# Patient Record
Sex: Female | Born: 1935 | Race: Black or African American | Hispanic: No | Marital: Single | Smoking: Never smoker
Health system: Southern US, Community
[De-identification: ages and names within clinical notes are randomized; demographics above are authoritative.]

## PROBLEM LIST (undated history)

## (undated) DIAGNOSIS — N289 Disorder of kidney and ureter, unspecified: Secondary | ICD-10-CM

## (undated) DIAGNOSIS — I10 Essential (primary) hypertension: Secondary | ICD-10-CM

## (undated) DIAGNOSIS — M1990 Unspecified osteoarthritis, unspecified site: Secondary | ICD-10-CM

## (undated) HISTORY — PX: CHOLECYSTECTOMY: SHX55

## (undated) HISTORY — PX: BREAST SURGERY: SHX581

---

## 2021-02-28 ENCOUNTER — Encounter (HOSPITAL_COMMUNITY): Payer: Self-pay

## 2021-02-28 ENCOUNTER — Ambulatory Visit (HOSPITAL_COMMUNITY)
Admission: EM | Admit: 2021-02-28 | Discharge: 2021-02-28 | Disposition: A | Discharge status: Home / Self Care | Payer: Medicare PPO | Attending: Physician Assistant | Admitting: Physician Assistant

## 2021-02-28 ENCOUNTER — Other Ambulatory Visit: Payer: Self-pay

## 2021-02-28 ENCOUNTER — Ambulatory Visit (INDEPENDENT_AMBULATORY_CARE_PROVIDER_SITE_OTHER): Payer: Medicare PPO

## 2021-02-28 DIAGNOSIS — R8290 Unspecified abnormal findings in urine: Secondary | ICD-10-CM | POA: Insufficient documentation

## 2021-02-28 DIAGNOSIS — U071 COVID-19: Secondary | ICD-10-CM | POA: Diagnosis present

## 2021-02-28 DIAGNOSIS — R059 Cough, unspecified: Secondary | ICD-10-CM

## 2021-02-28 HISTORY — DX: Unspecified osteoarthritis, unspecified site: M19.90

## 2021-02-28 HISTORY — DX: Disorder of kidney and ureter, unspecified: N28.9

## 2021-02-28 HISTORY — DX: Essential (primary) hypertension: I10

## 2021-02-28 LAB — COMPREHENSIVE METABOLIC PANEL
ALT: 27 U/L (ref 0–44)
AST: 38 U/L (ref 15–41)
Albumin: 3.7 g/dL (ref 3.5–5.0)
Alkaline Phosphatase: 53 U/L (ref 38–126)
Anion gap: 6 (ref 5–15)
BUN: 38 mg/dL — ABNORMAL HIGH (ref 8–23)
CO2: 29 mmol/L (ref 22–32)
Calcium: 8.9 mg/dL (ref 8.9–10.3)
Chloride: 105 mmol/L (ref 98–111)
Creatinine, Ser: 1.92 mg/dL — ABNORMAL HIGH (ref 0.44–1.00)
GFR, Estimated: 24 mL/min — ABNORMAL LOW (ref >60)
Glucose, Bld: 95 mg/dL (ref 70–99)
Potassium: 5 mmol/L (ref 3.5–5.1)
Sodium: 140 mmol/L (ref 135–145)
Total Bilirubin: 0.5 mg/dL (ref 0.3–1.2)
Total Protein: 7 g/dL (ref 6.5–8.1)

## 2021-02-28 LAB — POCT URINALYSIS DIPSTICK, ED / UC
Bilirubin Urine: NEGATIVE
Glucose, UA: NEGATIVE mg/dL
Ketones, ur: NEGATIVE mg/dL
Nitrite: POSITIVE — AB
Protein, ur: 100 mg/dL — AB
Specific Gravity, Urine: 1.02 (ref 1.005–1.030)
Urobilinogen, UA: 0.2 mg/dL (ref 0.0–1.0)
pH: 5 (ref 5.0–8.0)

## 2021-02-28 LAB — CBC WITH DIFFERENTIAL/PLATELET
Abs Immature Granulocytes: 0.01 10*3/uL (ref 0.00–0.07)
Basophils Absolute: 0 10*3/uL (ref 0.0–0.1)
Basophils Relative: 0 %
Eosinophils Absolute: 0 10*3/uL (ref 0.0–0.5)
Eosinophils Relative: 0 %
HCT: 39.1 % (ref 36.0–46.0)
Hemoglobin: 12.1 g/dL (ref 12.0–15.0)
Immature Granulocytes: 0 %
Lymphocytes Relative: 56 %
Lymphs Abs: 2.7 10*3/uL (ref 0.7–4.0)
MCH: 26.1 pg (ref 26.0–34.0)
MCHC: 30.9 g/dL (ref 30.0–36.0)
MCV: 84.4 fL (ref 80.0–100.0)
Monocytes Absolute: 0.4 10*3/uL (ref 0.1–1.0)
Monocytes Relative: 9 %
Neutro Abs: 1.7 10*3/uL (ref 1.7–7.7)
Neutrophils Relative %: 35 %
Platelets: 90 10*3/uL — ABNORMAL LOW (ref 150–400)
RBC: 4.63 MIL/uL (ref 3.87–5.11)
RDW: 14.1 % (ref 11.5–15.5)
WBC: 4.9 10*3/uL (ref 4.0–10.5)
nRBC: 0 % (ref 0.0–0.2)

## 2021-02-28 MED ORDER — BENZONATATE 100 MG PO CAPS: 100.0000 mg | ORAL_CAPSULE | Freq: Three times a day (TID) | ORAL | 0 refills | Status: DC

## 2021-02-28 MED ORDER — CEPHALEXIN 500 MG PO CAPS: 500.0000 mg | ORAL_CAPSULE | Freq: Two times a day (BID) | ORAL | 0 refills | Status: DC

## 2021-02-28 NOTE — ED Triage Notes (Signed)
Pt tested positive for covid Tuesday. Per daughter pt with congestion, cough, generalized weakness.

## 2021-02-28 NOTE — Discharge Instructions (Signed)
Your heart rate was low so I recommend that you follow-up with a cardiologist.  If you develop any lightheadedness or dizziness you need to go to the hospital.  We will be in contact with your lab results.  Make sure you are taking big deep breaths at least 10/h to help make sure that you are lungs are inflated properly.  You can use Tessalon for cough.  Your urine did have some evidence of infection so we are starting you on an antibiotic known as Keflex that you will take twice a day.  We will contact you if your urine culture indicates we need to change the antibiotics.  Someone should contact you to schedule an appointment with a primary care provider soon.  If you are not able to see a primary care provider within 1 week I recommend coming back for reevaluation.  If anything worsens please go to the hospital as we discussed.

## 2021-02-28 NOTE — ED Provider Notes (Signed)
[facility]    CSN: [id number] Arrival date & time: 02/28/21  1428      History   Chief Complaint Chief Complaint  Patient presents with   Cough   Nasal Congestion   Weakness    HPI Diane Skinner is a 87 y.o. female.   Patient presents today accompanied by her daughter who provides majority of history as patient is very hard of hearing despite using hearing aids.  Daughter reports that patient lives in a skilled nursing facility and was tested for COVID-19 on either Saturday or Sunday (02/22/2021 or 02/23/2021) with results on Tuesday (02/26/2021) that were positive.  Patient continues to have significant cough as well as generalized weakness but denies any chest pain, shortness of breath, nausea, vomiting, dizziness, syncope.  She has not been taking any medication for symptom onset.  She does have a history of hypertension but denies history of diabetes, immunosuppression, asthma, COPD, smoking.  She is up-to-date on COVID-19 vaccinations including 1 booster.  She is also up-to-date on influenza vaccination.   Past Medical History:  Diagnosis Date   Arthritis    Hypertension    Renal disorder     There are no problems to display for this patient.   Past Surgical History:  Procedure Laterality Date   BREAST SURGERY     CHOLECYSTECTOMY      OB History   No obstetric history on file.      Home Medications    Prior to Admission medications   Medication Sig Start Date End Date Taking? Authorizing Provider  AMLODIPINE BESYLATE PO Take by mouth daily.   Yes [provider]  benzonatate (TESSALON) 100 MG capsule Take 1 capsule (100 mg total) by mouth every 8 (eight) hours. 02/28/21  Yes Malia Corsi K, PA-C  cephALEXin (KEFLEX) 500 MG capsule Take 1 capsule (500 mg total) by mouth 2 (two) times daily. 02/28/21  Yes Pinkney Venard K, PA-C  Cyanocobalamin (VITAMIN B12 PO) Take by mouth at bedtime.   Yes [provider]  losartan (COZAAR) 25 MG tablet  Take by mouth daily.   Yes [provider]  LOVASTATIN PO Take by mouth at bedtime.   Yes [provider]  Multiple Vitamins-Minerals (THERA-M PO) Take by mouth daily.   Yes [provider]  OMEPRAZOLE PO Take by mouth daily.   Yes [provider]  TRAZODONE HCL PO Take by mouth at bedtime.   Yes [provider]    Family History Family History  Problem Relation Age of Onset   Hypertension Mother    Heart disease Mother    Breast cancer Sister     Social History Social History   Tobacco Use   Smoking status: Never  Substance Use Topics   Alcohol use: Not Currently   Drug use: Not Currently     Allergies   Patient has no known allergies.   Review of Systems Review of Systems  Constitutional:  Positive for activity change, appetite change and fatigue. Negative for fever.  HENT:  Negative for congestion, sinus pressure, sneezing and sore throat.   Respiratory:  Positive for cough. Negative for shortness of breath.   Cardiovascular:  Negative for chest pain.  Gastrointestinal:  Negative for abdominal pain, diarrhea, nausea and vomiting.  Musculoskeletal:  Negative for arthralgias and myalgias.  Neurological:  Negative for dizziness, light-headedness and headaches.    Physical Exam Triage Vital Signs ED Triage Vitals  Enc Vitals Group     BP  02/28/21 1554 135/68     Pulse Rate 02/28/21 1538 (!) 53     Resp 02/28/21 1538 (!) 22     Temp 02/28/21 1538 99.4 F (37.4 C)     Temp src --      SpO2 02/28/21 1538 100 %     Weight --      Height --      Head Circumference --      Peak Flow --      Pain Score 02/28/21 1540 0     Pain Loc --      Pain Edu? --      Excl. in GC? --    No data found.  Updated Vital Signs BP 135/68   Pulse (!) 48   Temp 99.4 F (37.4 C)   Resp 20   SpO2 99%   Visual Acuity Right Eye Distance:   Left Eye Distance:   Bilateral Distance:    Right Eye Near:   Left Eye Near:     Bilateral Near:     Physical Exam Vitals reviewed.  Constitutional:      General: She is awake. She is not in acute distress.    Appearance: Normal appearance. She is normal weight. She is not ill-appearing.     Comments: Very pleasant female appears stated age no acute distress  HENT:     Head: Normocephalic and atraumatic.     Right Ear: External ear normal.     Left Ear: External ear normal.     Nose: Nose normal. No congestion or rhinorrhea.     Right Sinus: No maxillary sinus tenderness or frontal sinus tenderness.     Left Sinus: No maxillary sinus tenderness or frontal sinus tenderness.     Mouth/Throat:     Pharynx: Uvula midline. No oropharyngeal exudate or posterior oropharyngeal erythema.     Comments: Moderate erythema in posterior oropharynx Cardiovascular:     Rate and Rhythm: Regular rhythm. Bradycardia present.     Heart sounds: Normal heart sounds, S1 normal and S2 normal. No murmur heard. Pulmonary:     Effort: Pulmonary effort is normal.     Breath sounds: Normal breath sounds. No wheezing, rhonchi or rales.     Comments: Clear to auscultation bilaterally Musculoskeletal:     Comments: Strength 4/5 bilateral upper and lower extremities  Lymphadenopathy:     Head:     Right side of head: No submental, submandibular or tonsillar adenopathy.     Left side of head: No submental, submandibular or tonsillar adenopathy.     Cervical: No cervical adenopathy.  Psychiatric:        Behavior: Behavior is cooperative.     UC Treatments / Results  Labs (all labs ordered are listed, but only abnormal results are displayed) Labs Reviewed  POCT URINALYSIS DIPSTICK, ED / UC - Abnormal; Notable for the following components:      Result Value   Hgb urine dipstick MODERATE (*)    Protein, ur 100 (*)    Nitrite POSITIVE (*)    Leukocytes,Ua TRACE (*)    All other components within normal limits  URINE CULTURE  CBC WITH DIFFERENTIAL/PLATELET  COMPREHENSIVE METABOLIC  PANEL    EKG   Radiology DG Chest 2 View  Result Date: 02/28/2021 CLINICAL DATA:  COVID-19, cough EXAM: CHEST - 2 VIEW COMPARISON:  None. FINDINGS: Cardiomediastinal silhouette is within normal limits. There is left basilar subsegmental atelectasis. No focal airspace consolidation. No large pleural effusion or visible  pneumothorax. No acute osseous abnormality. IMPRESSION: Left basilar subsegmental atelectasis. No focal airspace consolidation. Electronically Signed   By: Caprice Renshaw   On: 02/28/2021 16:47    Procedures Procedures (including critical care time)  Medications Ordered in UC Medications - No data to display  Initial Impression / Assessment and Plan / UC Course  I have reviewed the triage vital signs and the nursing notes.  Pertinent labs & imaging results that were available during my care of the patient were reviewed by me and considered in my medical decision making (see chart for details).      Vital signs physical exam reassuring today.  Patient was noted to be bradycardic and so EKG was obtained that showed sinus bradycardia with a ventricular rate of 45 bpm without acute changes.  Unfortunately we do not have a previous to compare.  Patient denies any symptoms including lightheadedness or near syncope.  X-ray showed atelectasis without acute infiltrate.  UA did show evidence of infection so patient was started on Keflex.  CBC and CMP obtained today-results pending.  Will adjust Keflex dose based on laboratory findings.  Urine culture obtained and discussed potential need of changing antibiotics based on susceptibilities identified on culture.  She was encouraged to drink plenty of fluid.  Discussed that they should monitor her pulse oxygenation and her pulse and if pulse ox goes under 93% she needs to be reevaluated immediately.  She recently moved to the area so we will try to establish her with cardiology for management of bradycardia as well as new PCP.  Discussed that if  she is unable to see PCP within 1 week he should return to our clinic for reevaluation.  Discussed alarm symptoms that warrant emergent evaluation.  Strict return precautions given to which patient expressed understanding.  40 minutes spent with patient performing history physical exam, coordinating care with daughter, prescribing medication, reviewing studies including chest x-ray, EKG.  Final Clinical Impressions(s) / UC Diagnoses   Final diagnoses:  COVID-19  Cough  Abnormal urine     Discharge Instructions      Your heart rate was low so I recommend that you follow-up with a cardiologist.  If you develop any lightheadedness or dizziness you need to go to the hospital.  We will be in contact with your lab results.  Make sure you are taking big deep breaths at least 10/h to help make sure that you are lungs are inflated properly.  You can use Tessalon for cough.  Your urine did have some evidence of infection so we are starting you on an antibiotic known as Keflex that you will take twice a day.  We will contact you if your urine culture indicates we need to change the antibiotics.  Someone should contact you to schedule an appointment with a primary care provider soon.  If you are not able to see a primary care provider within 1 week I recommend coming back for reevaluation.  If anything worsens please go to the hospital as we discussed.     ED Prescriptions     Medication Sig Dispense Auth. Provider   cephALEXin (KEFLEX) 500 MG capsule Take 1 capsule (500 mg total) by mouth 2 (two) times daily. 20 capsule Bertrum Helmstetter K, PA-C   benzonatate (TESSALON) 100 MG capsule Take 1 capsule (100 mg total) by mouth every 8 (eight) hours. 21 capsule Ty Oshima K, PA-C      PDMP not reviewed this encounter.   Henli Hey, Noberto Retort, PA-C  02/28/21 1727  

## 2021-03-08 ENCOUNTER — Encounter (HOSPITAL_COMMUNITY): Payer: Self-pay

## 2021-03-08 ENCOUNTER — Other Ambulatory Visit: Payer: Self-pay

## 2021-03-08 ENCOUNTER — Ambulatory Visit (HOSPITAL_COMMUNITY)
Admission: RE | Admit: 2021-03-08 | Discharge: 2021-03-08 | Disposition: A | Discharge status: Home / Self Care | Payer: Medicare PPO | Source: Ambulatory Visit | Attending: Emergency Medicine | Admitting: Emergency Medicine

## 2021-03-08 VITALS — BP 138/64 | HR 49 | Temp 98.4°F | Resp 17

## 2021-03-08 DIAGNOSIS — R8290 Unspecified abnormal findings in urine: Secondary | ICD-10-CM | POA: Diagnosis not present

## 2021-03-08 LAB — POCT URINALYSIS DIPSTICK, ED / UC
Bilirubin Urine: NEGATIVE
Glucose, UA: NEGATIVE mg/dL
Hgb urine dipstick: NEGATIVE
Ketones, ur: NEGATIVE mg/dL
Leukocytes,Ua: NEGATIVE
Nitrite: NEGATIVE
Protein, ur: 30 mg/dL — AB
Specific Gravity, Urine: 1.015 (ref 1.005–1.030)
Urobilinogen, UA: 0.2 mg/dL (ref 0.0–1.0)
pH: 5 (ref 5.0–8.0)

## 2021-03-08 NOTE — ED Triage Notes (Signed)
Pt in for follow up appt   States she had negative covid test last week and UTI but sx have resolved

## 2021-03-08 NOTE — ED Provider Notes (Signed)
[facility]  ____________________________________________  Time seen: Approximately 1:32 PM  I have reviewed the triage vital signs and the nursing notes.   HISTORY  Chief Complaint No chief complaint on file.   Historian Patient     HPI JEZABEL LECKER is a 87 y.o. female presents to the urgent care for a recheck.  Patient had both COVID-19 and UTI over the past 10 days.  Patient is accompanied by her niece who reports that her symptoms have seemingly resolved.  No dysuria, fever, vomiting or diarrhea.  Patient's niece reports the cough seems to be resolving.   Past Medical History:  Diagnosis Date   Arthritis    Hypertension    Renal disorder      Immunizations up to date:  Yes.     Past Medical History:  Diagnosis Date   Arthritis    Hypertension    Renal disorder     There are no problems to display for this patient.   Past Surgical History:  Procedure Laterality Date   BREAST SURGERY     CHOLECYSTECTOMY      Prior to Admission medications   Medication Sig Start Date End Date Taking? Authorizing Provider  AMLODIPINE BESYLATE PO Take by mouth daily.    [provider]  benzonatate (TESSALON) 100 MG capsule Take 1 capsule (100 mg total) by mouth every 8 (eight) hours. 02/28/21   Raspet, Noberto Retort, PA-C  cephALEXin (KEFLEX) 500 MG capsule Take 1 capsule (500 mg total) by mouth 2 (two) times daily. 02/28/21   Raspet, Erin K, PA-C  Cyanocobalamin (VITAMIN B12 PO) Take by mouth at bedtime.    [provider]  losartan (COZAAR) 25 MG tablet Take by mouth daily.    [provider]  LOVASTATIN PO Take by mouth at bedtime.    [provider]  Multiple Vitamins-Minerals (THERA-M PO) Take by mouth daily.    [provider]  OMEPRAZOLE PO Take by mouth daily.    [provider]  TRAZODONE HCL PO Take by mouth at bedtime.    [provider]    Allergies Patient has no known allergies.  Family  History  Problem Relation Age of Onset   Hypertension Mother    Heart disease Mother    Breast cancer Sister     Social History Social History   Tobacco Use   Smoking status: Never   Smokeless tobacco: Never  Substance Use Topics   Alcohol use: Not Currently   Drug use: Not Currently     Review of Systems  Constitutional: No fever/chills Eyes:  No discharge ENT: No upper respiratory complaints. Respiratory: no cough. No SOB/ use of accessory muscles to breath Gastrointestinal:   No nausea, no vomiting.  No diarrhea.  No constipation. Musculoskeletal: Negative for musculoskeletal pain. Skin: Negative for rash, abrasions, lacerations, ecchymosis.    ____________________________________________   PHYSICAL EXAM:  VITAL SIGNS: ED Triage Vitals  Enc Vitals Group     BP 03/08/21 1240 138/64     Pulse Rate 03/08/21 1240 (!) 49     Resp 03/08/21 1240 17     Temp 03/08/21 1240 98.4 F (36.9 C)     Temp Source 03/08/21 1240 Oral     SpO2 03/08/21 1240 100 %     Weight --      Height --      Head Circumference --      Peak Flow --      Pain Score 03/08/21 1238  0     Pain Loc --      Pain Edu? --      Excl. in GC? --      Constitutional: Alert and oriented. Well appearing and in no acute distress. Eyes: Conjunctivae are normal. PERRL. EOMI. Head: Atraumatic. ENT:      Nose: No congestion/rhinnorhea.      Mouth/Throat: Mucous membranes are moist.  Neck: No stridor.  No cervical spine tenderness to palpation. Cardiovascular: Normal rate, regular rhythm. Normal S1 and S2.  Good peripheral circulation. Respiratory: Normal respiratory effort without tachypnea or retractions. Lungs CTAB. Good air entry to the bases with no decreased or absent breath sounds Gastrointestinal: Bowel sounds x 4 quadrants. Soft and nontender to palpation. No guarding or rigidity. No distention. Musculoskeletal: Full range of motion to all extremities. No obvious deformities  noted Neurologic:  Normal for age. No gross focal neurologic deficits are appreciated.  Skin:  Skin is warm, dry and intact. No rash noted. Psychiatric: Mood and affect are normal for age. Speech and behavior are normal.   ____________________________________________   LABS (all labs ordered are listed, but only abnormal results are displayed)  Labs Reviewed  POCT URINALYSIS DIPSTICK, ED / UC - Abnormal; Notable for the following components:      Result Value   Protein, ur 30 (*)    All other components within normal limits   ____________________________________________  EKG   ____________________________________________  RADIOLOGY   No results found.  ____________________________________________    PROCEDURES  Procedure(s) performed:     Procedures     Medications - No data to display   ____________________________________________   INITIAL IMPRESSION / ASSESSMENT AND PLAN / ED COURSE  Pertinent labs & imaging results that were available during my care of the patient were reviewed by me and considered in my medical decision making (see chart for details).      Assessment and plan Abnormal urine Reevaluation 87 year old female presents to the urgent care for reevaluation after recovering from COVID-19 and a UTI.  Urinalysis shows no signs of UTI today and patient denies dysuria, hematuria, increased urinary frequency, low back pain, nausea and vomiting.  Patient reports that cough and viral URI-like symptoms seem to be improving.  Reassurance was given.  All patient questions were answered.  Patient was advised to follow-up with PCP as needed.    ____________________________________________  FINAL CLINICAL IMPRESSION(S) / ED DIAGNOSES  Final diagnoses:  Abnormal urine      NEW MEDICATIONS STARTED DURING THIS VISIT:  ED Discharge Orders     None           This chart was dictated using voice recognition software/Dragon. Despite best  efforts to proofread, errors can occur which can change the meaning. Any change was purely unintentional.     Orvil Feil, PA-C 03/08/21 1359

## 2021-03-13 ENCOUNTER — Encounter: Payer: Medicare PPO | Admitting: Internal Medicine

## 2021-03-14 ENCOUNTER — Ambulatory Visit (INDEPENDENT_AMBULATORY_CARE_PROVIDER_SITE_OTHER): Payer: Medicare PPO | Admitting: Internal Medicine

## 2021-03-14 ENCOUNTER — Other Ambulatory Visit: Payer: Self-pay

## 2021-03-14 VITALS — BP 171/78 | HR 57 | Temp 98.4°F | Wt 119.6 lb

## 2021-03-14 DIAGNOSIS — I10 Essential (primary) hypertension: Secondary | ICD-10-CM

## 2021-03-14 DIAGNOSIS — D696 Thrombocytopenia, unspecified: Secondary | ICD-10-CM | POA: Diagnosis not present

## 2021-03-14 DIAGNOSIS — N3000 Acute cystitis without hematuria: Secondary | ICD-10-CM

## 2021-03-14 DIAGNOSIS — D75839 Thrombocytosis, unspecified: Secondary | ICD-10-CM

## 2021-03-14 DIAGNOSIS — N289 Disorder of kidney and ureter, unspecified: Secondary | ICD-10-CM | POA: Diagnosis not present

## 2021-03-14 NOTE — Progress Notes (Signed)
   CC: Establish Care  HPI:  DianeDiane Skinner is a 87 y.o. person, with a PMH noted below, who presents to the clinic to establish care. To see the management of their acute and chronic conditions, please see the A&P note under the Encounters tab.   Patient is a 87 y/o female accompanied by her sister for today's visit. Both are unsure of patient's overall medical Hx and medication doses. They do not have medications with them today. Patient has moved to [place] to live at [facility]. She was previously seen at [facility] per Diane Skinner's sister. Will need to request for records today to ascertain her medical history. Will reach out to the clinic pharmacist for medication reconciliation.    Past Medical History:  Diagnosis Date   Arthritis    Hypertension    Renal disorder    PMH:  HTN   Medications:  Losartan  Omeprazole  Amlodipine  Lovastatin  Vitamin B12 Trazodone   Surgical H:  Gall bladder  Hospitalizations:  None  Vaccinations:  Unsure  FH:  DM: Mother, Brother HTN: Multiple family members Cancer: 2 brothers with cancer (unsure type)  Stroke: Father, Younger brother MI: Younger brother    SH:  Housing: Occupational psychologist  Job: Retired, worked as a Investment banker, operational at the hospital  Tobacco: When she was young, unsure when quit ETOH: None Drug: None   Review of Systems:   Review of Systems  Constitutional:  Negative for chills, fever, malaise/fatigue and weight loss.  Gastrointestinal:  Negative for abdominal pain, constipation, nausea and vomiting.  Genitourinary:  Negative for dysuria, flank pain, frequency, hematuria and urgency.  Neurological:  Negative for dizziness, tingling and headaches.    Physical Exam:  Vitals:   03/14/21 1409  BP: (!) 171/78  Pulse: (!) 57  Temp: 98.4 F (36.9 C)  TempSrc: Oral  SpO2: 100%  Weight: 119 lb 9.6 oz (54.3 kg)   Physical Exam Constitutional:      Appearance: Normal appearance.  HENT:     Head:  Normocephalic and atraumatic.  Cardiovascular:     Rate and Rhythm: Normal rate and regular rhythm.     Pulses: Normal pulses.     Heart sounds: Normal heart sounds. No murmur heard.   No friction rub. No gallop.  Pulmonary:     Effort: Pulmonary effort is normal. No respiratory distress.     Breath sounds: Normal breath sounds. No stridor. No wheezing, rhonchi or rales.  Abdominal:     General: Abdomen is flat. Bowel sounds are normal.     Tenderness: There is no abdominal tenderness. There is no guarding.  Musculoskeletal:        General: No swelling.     Right lower leg: No edema.     Left lower leg: No edema.  Skin:    General: Skin is warm.  Neurological:     Mental Status: She is alert.  Psychiatric:        Behavior: Behavior normal.     Assessment & Plan:   See Encounters Tab for problem based charting.  Patient discussed with Dr. Antony Contras

## 2021-03-14 NOTE — Patient Instructions (Signed)
Diane Skinner,  It was a pleasure to meet you today!  Today we had a discussion for establishing care. Today we talked about her blood pressure, kidney function, urinary tract infection, and medications.  For your blood pressure, please continue to blood pressure medications. We will check your blood pressure at your next visit, check some blood work is continuing today.  We will reach out to the pharmacist to see if we can assist with your medication dosing.  It was noted on your ED visit that your kidneys appear damaged.  We will repeat blood work today to assess her kidney function.  Please continue to drink fluids and take your blood pressure medications and this will help with your kidney function as well.  Your medications, we will reach out for your charts from [facility].  We will additionally reach out to our pharmacist to see if they can help you with your medication reconciliation.  Please continue using your medications as prescribed. Dolan Amen, MD

## 2021-03-15 ENCOUNTER — Emergency Department (HOSPITAL_COMMUNITY)
Admission: EM | Admit: 2021-03-15 | Discharge: 2021-03-15 | Disposition: A | Discharge status: Home / Self Care | Payer: Medicare PPO | Attending: Emergency Medicine | Admitting: Emergency Medicine

## 2021-03-15 ENCOUNTER — Emergency Department (HOSPITAL_COMMUNITY): Payer: Medicare PPO

## 2021-03-15 ENCOUNTER — Other Ambulatory Visit: Payer: Self-pay

## 2021-03-15 DIAGNOSIS — R630 Anorexia: Secondary | ICD-10-CM | POA: Insufficient documentation

## 2021-03-15 DIAGNOSIS — D696 Thrombocytopenia, unspecified: Secondary | ICD-10-CM | POA: Insufficient documentation

## 2021-03-15 DIAGNOSIS — I10 Essential (primary) hypertension: Secondary | ICD-10-CM | POA: Insufficient documentation

## 2021-03-15 DIAGNOSIS — R001 Bradycardia, unspecified: Secondary | ICD-10-CM | POA: Insufficient documentation

## 2021-03-15 DIAGNOSIS — N189 Chronic kidney disease, unspecified: Secondary | ICD-10-CM | POA: Diagnosis not present

## 2021-03-15 DIAGNOSIS — Z79899 Other long term (current) drug therapy: Secondary | ICD-10-CM | POA: Diagnosis not present

## 2021-03-15 DIAGNOSIS — F0390 Unspecified dementia without behavioral disturbance: Secondary | ICD-10-CM | POA: Insufficient documentation

## 2021-03-15 DIAGNOSIS — Z8616 Personal history of COVID-19: Secondary | ICD-10-CM | POA: Insufficient documentation

## 2021-03-15 DIAGNOSIS — R410 Disorientation, unspecified: Secondary | ICD-10-CM | POA: Diagnosis present

## 2021-03-15 DIAGNOSIS — N289 Disorder of kidney and ureter, unspecified: Secondary | ICD-10-CM | POA: Insufficient documentation

## 2021-03-15 DIAGNOSIS — I129 Hypertensive chronic kidney disease with stage 1 through stage 4 chronic kidney disease, or unspecified chronic kidney disease: Secondary | ICD-10-CM | POA: Insufficient documentation

## 2021-03-15 DIAGNOSIS — N390 Urinary tract infection, site not specified: Secondary | ICD-10-CM | POA: Insufficient documentation

## 2021-03-15 LAB — CBC WITH DIFFERENTIAL/PLATELET
Basophils Absolute: 0 10*3/uL (ref 0.0–0.2)
Basos: 0 %
EOS (ABSOLUTE): 0 10*3/uL (ref 0.0–0.4)
Eos: 0 %
Hematocrit: 39.8 % (ref 34.0–46.6)
Hemoglobin: 12.7 g/dL (ref 11.1–15.9)
Immature Grans (Abs): 0 10*3/uL (ref 0.0–0.1)
Immature Granulocytes: 0 %
Lymphocytes Absolute: 2.1 10*3/uL (ref 0.7–3.1)
Lymphs: 31 %
MCH: 26 pg — ABNORMAL LOW (ref 26.6–33.0)
MCHC: 31.9 g/dL (ref 31.5–35.7)
MCV: 82 fL (ref 79–97)
Monocytes Absolute: 0.5 10*3/uL (ref 0.1–0.9)
Monocytes: 7 %
Neutrophils Absolute: 4 10*3/uL (ref 1.4–7.0)
Neutrophils: 62 %
Platelets: 149 10*3/uL — ABNORMAL LOW (ref 150–450)
RBC: 4.88 x10E6/uL (ref 3.77–5.28)
RDW: 14.3 % (ref 11.7–15.4)
WBC: 6.6 10*3/uL (ref 3.4–10.8)

## 2021-03-15 LAB — COMPREHENSIVE METABOLIC PANEL
ALT: 17 U/L (ref 0–44)
AST: 28 U/L (ref 15–41)
Albumin: 3.5 g/dL (ref 3.5–5.0)
Alkaline Phosphatase: 51 U/L (ref 38–126)
Anion gap: 6 (ref 5–15)
BUN: 18 mg/dL (ref 8–23)
CO2: 27 mmol/L (ref 22–32)
Calcium: 9 mg/dL (ref 8.9–10.3)
Chloride: 104 mmol/L (ref 98–111)
Creatinine, Ser: 1.63 mg/dL — ABNORMAL HIGH (ref 0.44–1.00)
GFR, Estimated: 29 mL/min — ABNORMAL LOW (ref >60)
Glucose, Bld: 92 mg/dL (ref 70–99)
Potassium: 4.4 mmol/L (ref 3.5–5.1)
Sodium: 137 mmol/L (ref 135–145)
Total Bilirubin: 1.6 mg/dL — ABNORMAL HIGH (ref 0.3–1.2)
Total Protein: 6.8 g/dL (ref 6.5–8.1)

## 2021-03-15 LAB — BMP8+ANION GAP
Anion Gap: 16 mmol/L (ref 10.0–18.0)
BUN/Creatinine Ratio: 11 — ABNORMAL LOW (ref 12–28)
BUN: 18 mg/dL (ref 10–36)
CO2: 24 mmol/L (ref 20–29)
Calcium: 9.8 mg/dL (ref 8.7–10.3)
Chloride: 102 mmol/L (ref 96–106)
Creatinine, Ser: 1.57 mg/dL — ABNORMAL HIGH (ref 0.57–1.00)
Glucose: 87 mg/dL (ref 65–99)
Potassium: 5 mmol/L (ref 3.5–5.2)
Sodium: 142 mmol/L (ref 134–144)
eGFR: 30 mL/min/{1.73_m2} — ABNORMAL LOW (ref >59)

## 2021-03-15 LAB — URINALYSIS, ROUTINE W REFLEX MICROSCOPIC
Bacteria, UA: NONE SEEN
Bilirubin Urine: NEGATIVE
Glucose, UA: NEGATIVE mg/dL
Hgb urine dipstick: NEGATIVE
Ketones, ur: NEGATIVE mg/dL
Leukocytes,Ua: NEGATIVE
Nitrite: NEGATIVE
Protein, ur: 100 mg/dL — AB
Specific Gravity, Urine: 1.005 (ref 1.005–1.030)
pH: 7 (ref 5.0–8.0)

## 2021-03-15 LAB — CBC
HCT: 36.9 % (ref 36.0–46.0)
Hemoglobin: 11.6 g/dL — ABNORMAL LOW (ref 12.0–15.0)
MCH: 26 pg (ref 26.0–34.0)
MCHC: 31.4 g/dL (ref 30.0–36.0)
MCV: 82.7 fL (ref 80.0–100.0)
Platelets: 132 10*3/uL — ABNORMAL LOW (ref 150–400)
RBC: 4.46 MIL/uL (ref 3.87–5.11)
RDW: 13.8 % (ref 11.5–15.5)
WBC: 6.2 10*3/uL (ref 4.0–10.5)
nRBC: 0 % (ref 0.0–0.2)

## 2021-03-15 NOTE — Assessment & Plan Note (Addendum)
Patient presenting to establish care found to have thrombocytopenia with a platelet count of 90, will repeat today. Will need medical records from [facility].  - Repeat CBC

## 2021-03-15 NOTE — ED Triage Notes (Signed)
Pt back today brought back in by family with c/o pt being confused and not wanting to eat

## 2021-03-15 NOTE — Assessment & Plan Note (Signed)
BMP Latest Ref Rng & Units 03/14/2021 02/28/2021  Glucose 65 - 99 mg/dL 87 95  BUN 10 - 36 mg/dL 18 12(O)  Creatinine 1.18 - 1.00 mg/dL 8.67(R) 3.73(G)  BUN/Creat Ratio 12 - 28 11(L) -  Sodium 134 - 144 mmol/L 142 140  Potassium 3.5 - 5.2 mmol/L 5.0 5.0  Chloride 96 - 106 mmol/L 102 105  CO2 20 - 29 mmol/L 24 29  Calcium 8.7 - 10.3 mg/dL 9.8 8.9   Patient presents to the clinic today with findings concerning for kidney injury at her ED visit. She is accompanied by her sister today who states that [facility] has been looking at her kidney function for the past year. She is taking losartan for renal protection. Patient has not noted decrease in urination or hematuria.  Will collect records from her previous provider at the [facility].  - Continue Losartan 25 mg

## 2021-03-15 NOTE — Assessment & Plan Note (Signed)
Patient presenting to establish care found to have t

## 2021-03-15 NOTE — Assessment & Plan Note (Signed)
Vitals:   03/14/21 1409  BP: (!) 171/78  Pulse: (!) 57  Temp: 98.4 F (36.9 C)  SpO2: 100%   Patient is accompanied by her sister today for establishment of care today. Diane Skinner takes amlodipine and losartan. What limited records we have shows that she takes losartan 25 mg daily, but they are unsure of the amlodipine.    Her blood pressure is uncontrolled at this time. She has not taken her medications today, and she does not have her medications with her today. Will requests records today and have her reevaluated in 4 weeks for BP check. Will additionally reach out to pharmacy for medication reconciliation.  - Requests records from [facility] - RTC in 4 weeks for BP check

## 2021-03-15 NOTE — ED Notes (Signed)
Patient transported to CT 

## 2021-03-15 NOTE — Assessment & Plan Note (Signed)
Completed course of Keflex. Resolved

## 2021-03-15 NOTE — ED Provider Notes (Signed)
[facility] EMERGENCY DEPARTMENT Provider Note   CSN: [id number] Arrival date & time: 03/15/21  1103     History No chief complaint on file.   Diane Skinner is a 87 y.o. female.  The history is provided by the patient, a relative and medical records.  Diane Skinner is a 87 y.o. female who presents to the Emergency Department complaining of confusion and poor appetite. Level V caveat due to confusion. History is provided by the patient and her niece. Patient was diagnosed with COVID-19 about two weeks ago. She did have a mild cough at the time of diagnosis, which has since resolved. She was initially seen at urgent care was also diagnosed with UTI and she is completed a course of antibiotics for this as well. The patient recently moved to the area about three weeks ago from [place]. [facility] living facility. She has some memory issues at baseline. Niece reports that the patient didn't eat as much yesterday. She has been drinking ensuring Gatorade but does not complete an entire bottle. This also occurred today. Nice also reports that yesterday when she the patient was in the vehicle and it was hot out the patient got hot and inappropriately attempted to take off her clothes in the vehicle. She seemed at that time like she wasn't thinking as clearly as she usually does.   No fever.  Had a cough - now resolved.  No HA, CP, AP, N/V/D.   Patient denies any acute complaints.    Past Medical History:  Diagnosis Date   Arthritis    Hypertension    Renal disorder     Patient Active Problem List   Diagnosis Date Noted   Thrombocytopenia (HCC) 03/15/2021   Renal disorder    Hypertension     Past Surgical History:  Procedure Laterality Date   BREAST SURGERY     CHOLECYSTECTOMY       OB History   No obstetric history on file.     Family History  Problem Relation Age of Onset   Hypertension Mother     Heart disease Mother    Breast cancer Sister     Social History   Tobacco Use   Smoking status: Never   Smokeless tobacco: Never  Substance Use Topics   Alcohol use: Not Currently   Drug use: Not Currently    Home Medications Prior to Admission medications   Medication Sig Start Date End Date Taking? Authorizing Provider  AMLODIPINE BESYLATE PO Take by mouth daily.    [provider]  benzonatate (TESSALON) 100 MG capsule Take 1 capsule (100 mg total) by mouth every 8 (eight) hours. 02/28/21   Raspet, Noberto Retort, PA-C  cephALEXin (KEFLEX) 500 MG capsule Take 1 capsule (500 mg total) by mouth 2 (two) times daily. 02/28/21   Raspet, Erin K, PA-C  Cyanocobalamin (VITAMIN B12 PO) Take by mouth at bedtime.    [provider]  losartan (COZAAR) 25 MG tablet Take by mouth daily.    [provider]  LOVASTATIN PO Take by mouth at bedtime.    [provider]  Multiple Vitamins-Minerals (THERA-M PO) Take by mouth daily.    [provider]  OMEPRAZOLE PO Take by mouth daily.    [provider]  TRAZODONE HCL PO Take by mouth at bedtime.    [provider]    Allergies    Patient has no known allergies.  Review of Systems   Review of Systems  All other systems reviewed and are negative.  Physical Exam Updated Vital Signs BP (!) 151/79   Pulse (!) 47   Temp 98.5 F (36.9 C) (Oral)   Resp 13   SpO2 99%   Physical Exam Vitals and nursing note reviewed.  Constitutional:      Appearance: She is well-developed.  HENT:     Head: Normocephalic and atraumatic.  Cardiovascular:     Rate and Rhythm: Regular rhythm. Bradycardia present.     Heart sounds: No murmur heard. Pulmonary:     Effort: Pulmonary effort is normal. No respiratory distress.     Breath sounds: Normal breath sounds.  Abdominal:     Palpations: Abdomen is soft.     Tenderness: There is no abdominal tenderness. There is no guarding or rebound.   Musculoskeletal:        General: No tenderness.  Skin:    General: Skin is warm and dry.  Neurological:     Mental Status: She is alert.     Comments: No asymmetry of facial movements. Five out of five strength in all four extremities with sensation to light touch intact in all four extremities. Oriented to person and place. Disoriented to time.  Psychiatric:        Behavior: Behavior normal.    ED Results / Procedures / Treatments   Labs (all labs ordered are listed, but only abnormal results are displayed) Labs Reviewed  CBC - Abnormal; Notable for the following components:      Result Value   Hemoglobin 11.6 (*)    Platelets 132 (*)    All other components within normal limits  COMPREHENSIVE METABOLIC PANEL - Abnormal; Notable for the following components:   Creatinine, Ser 1.63 (*)    Total Bilirubin 1.6 (*)    GFR, Estimated 29 (*)    All other components within normal limits  URINALYSIS, ROUTINE W REFLEX MICROSCOPIC - Abnormal; Notable for the following components:   Protein, ur 100 (*)    All other components within normal limits  URINE CULTURE    EKG EKG Interpretation  Date/Time:  Friday March 15 2021 13:56:34 EDT Ventricular Rate:  48 PR Interval:  151 QRS Duration: 86 QT Interval:  500 QTC Calculation: 447 R Axis:   7 Text Interpretation: Sinus bradycardia Confirmed by Tilden Fossa [phone]) on 03/15/2021 2:41:11 PM  Radiology CT Head Wo Contrast  Result Date: 03/15/2021 CLINICAL DATA:  Mental status change. EXAM: CT HEAD WITHOUT CONTRAST TECHNIQUE: Contiguous axial images were obtained from the base of the skull through the vertex without intravenous contrast. COMPARISON:  None. FINDINGS: Mildly motion limited evaluation. Brain: No evidence of acute large vascular territory infarction, hemorrhage, hydrocephalus, extra-axial collection or mass lesion/mass effect. Moderate patchy white matter hypoattenuation, nonspecific but most likely related to chronic  microvascular ischemic disease. Mild for age atrophy. Vascular: Calcific atherosclerosis. No hyperdense vessel identified. Skull: No acute fracture. Sinuses/Orbits: Mild paranasal sinus mucosal thickening. No acute orbital findings. Other: No mastoid effusions. IMPRESSION: 1. No evidence of acute intracranial abnormality. 2. Moderate chronic microvascular ischemic disease and mild atrophy. Electronically Signed   By: Feliberto Harts MD   On: 03/15/2021 13:52    Procedures Procedures   Medications Ordered in ED Medications - No data to display  ED Course  I have reviewed the triage vital signs and the nursing notes.  Pertinent labs & imaging results that were available during my care of the patient  were reviewed by me and considered in my medical decision making (see chart for details).    MDM Rules/Calculators/A&P                         Patient with history of CKD, dementia here for evaluation of poor oral intake since yesterday with a confusional episode yesterday. Records reviewed in care everywhere. She is bradycardic and this appears to be at her baseline. She is disoriented to date but otherwise oriented. No focal neurologic deficits on examination. Labs are with stable renal insufficiency. UA is not consistent with UTI today. Current presentation is not consistent with CVA, sepsis, delirium. Discussed oral fluid hydration in the setting of recent COVID infection and outpatient follow-up and return precautions.  Final Clinical Impression(s) / ED Diagnoses Final diagnoses:  Confusion  Poor appetite    Rx / DC Orders ED Discharge Orders     None        Tilden Fossa, MD 03/15/21 1451

## 2021-03-16 LAB — URINE CULTURE: Culture: <10000 — AB

## 2021-03-19 NOTE — Progress Notes (Signed)
Internal Medicine Clinic Attending ? ?Case discussed with Dr. Winters  At the time of the visit.  We reviewed the resident?s history and exam and pertinent patient test results.  I agree with the assessment, diagnosis, and plan of care documented in the resident?s note.  ?

## 2021-04-11 ENCOUNTER — Telehealth: Payer: Self-pay

## 2021-04-11 ENCOUNTER — Other Ambulatory Visit: Payer: Self-pay | Admitting: Student

## 2021-04-11 DIAGNOSIS — I10 Essential (primary) hypertension: Secondary | ICD-10-CM

## 2021-04-11 DIAGNOSIS — N289 Disorder of kidney and ureter, unspecified: Secondary | ICD-10-CM

## 2021-04-11 MED ORDER — LOSARTAN POTASSIUM 25 MG PO TABS: 25.0000 mg | ORAL_TABLET | Freq: Every day | ORAL | 11 refills | Status: DC

## 2021-04-11 MED ORDER — AMLODIPINE BESYLATE 10 MG PO TABS: 10.0000 mg | ORAL_TABLET | Freq: Every day | ORAL | 11 refills | Status: DC

## 2021-04-11 NOTE — Telephone Encounter (Signed)
Patient had office visit w/ Dr. Sande Brothers on 03/14/21 , 1st visit & establish care visit.  Per office visit notes records request sent to [facility], nothing noted in chart.  TC [facility]'s [facility], spoke with pharmacist who states they have no fills on file for any meds.  TC to patients niece, Amalia Greenhouse [facility]), she states patient is completely out of Amlodipine and Lovastatin and needs refill.  Per LOV, pt is to have b/p follow up 4 weeks from LOV, appt scheduled for 04/17/21 @ 0945 w/ red team, Dr. Marijo Conception.  EC asking for refill on b/p med, but she doesn't know the dosage.  EC gave RN phone number from old pharmacy, [facility], [place], [place].    TC to previous pharmacy in [place], spoke with pharmacist, Danne Harbor who states patient picked up Amlodipine 10mg , take one daily #90 on 12/14/20.  She has no recent RX's for the Lovastatin.  (As of note, pharmacist also states patient picked up Losartan 50mg , take 2 daily on 03/20/21.  This is different than what is written on med list).  Pt would benefit from appt w/ [facility] clinical pharmacist if MD approves.  Will forward to PCP and red team.  Please advise on amlodipine refill request. Thank you, SChaplin, RN,BSN

## 2021-04-11 NOTE — Telephone Encounter (Signed)
AMLODIPINE BESYLATE PO  LOVASTATIN PO, ref[facility] @ [id number] [place], [place] - [address]  Pt's niece requesting all meds to be fill by today, states pt completely out.

## 2021-04-11 NOTE — Telephone Encounter (Signed)
TC to Ms Thurmond Butts [facility]) VM obtained and a message was left that 2 RX's for b/p were [facility] (Amlodipine and Losartan).  Instructions left that these were the only 2 b/p meds patient were to take for b/p medciation and not to take the older dose of Losartan. Also informed to be here at 0900 on 04/17/21 to meet with Pharmacist before MD appt at 0945.  RN asked if [facility] would call triage back and let triage know she understood instructions. SChaplin, RN,BSN

## 2021-04-16 NOTE — Telephone Encounter (Signed)
ERROR

## 2021-04-17 ENCOUNTER — Ambulatory Visit (INDEPENDENT_AMBULATORY_CARE_PROVIDER_SITE_OTHER): Payer: Medicare PPO | Admitting: Student

## 2021-04-17 ENCOUNTER — Other Ambulatory Visit: Payer: Self-pay

## 2021-04-17 ENCOUNTER — Ambulatory Visit (INDEPENDENT_AMBULATORY_CARE_PROVIDER_SITE_OTHER): Payer: Medicare PPO | Admitting: Pharmacist

## 2021-04-17 ENCOUNTER — Encounter: Payer: Self-pay | Admitting: Student

## 2021-04-17 DIAGNOSIS — I10 Essential (primary) hypertension: Secondary | ICD-10-CM | POA: Diagnosis not present

## 2021-04-17 DIAGNOSIS — R001 Bradycardia, unspecified: Secondary | ICD-10-CM | POA: Diagnosis not present

## 2021-04-17 DIAGNOSIS — Z79899 Other long term (current) drug therapy: Secondary | ICD-10-CM

## 2021-04-17 DIAGNOSIS — S31000A Unspecified open wound of lower back and pelvis without penetration into retroperitoneum, initial encounter: Secondary | ICD-10-CM | POA: Diagnosis not present

## 2021-04-17 DIAGNOSIS — Z0000 Encounter for general adult medical examination without abnormal findings: Secondary | ICD-10-CM | POA: Diagnosis not present

## 2021-04-17 NOTE — Patient Instructions (Signed)
[name], it was a pleasure seeing you today!  Today we discussed: - Blood pressure: We will continue with your blood pressure medications as prescribed.  - Make sure you are drinking lots of water while  you are outside.  - For the wound, I would recommend an over the counter barrier ointment. You can get these at a local pharmacy.  Follow-up: 3 months   Please make sure to arrive 15 minutes prior to your next appointment. If you arrive late, you may be asked to reschedule.   We look forward to seeing you next time. Please call our clinic at [phone] if you have any questions or concerns. The best time to call is Monday-Friday from 9am-4pm, but there is someone available 24/7. If after hours or the weekend, call the main hospital number and ask for the Internal Medicine Resident On-Call. If you need medication refills, please notify your pharmacy one week in advance and they will send [place] a request.  Thank you for letting us take part in your care. Wishing you the best!  Thank you, Evlyn Kanner, MD

## 2021-04-17 NOTE — Assessment & Plan Note (Addendum)
Diane Skinner is accompanied by her niece today. She recently moved from the [facility] region and is currently living at an independent living facility. Her family has hired Chemical engineer to assist with her ADL's and administering medications. [name] denies any recent falls and states she feels safe at her facility. She has been sitting outside for multiple hours in the evenings with her friends. Of note, she has had a recent episode of dehydration. I had a discussion with [name] regarding the importance of aggressive hydration while she is sitting outside, especially during the summer months. Records have been obtained by our clinic, will review for need for any immunizations. In addition, records have previous Mini-Cog and MOCAs. Will need to review these prior to next visit for further assessment. Today [name] does not have her hearing aides, making this difficult for an accurate assessment.

## 2021-04-17 NOTE — Progress Notes (Signed)
   Subjective:    Patient ID: Diane Skinner, female    DOB: 08-Oct-1922, 87 y.o.   MRN: [id number]  HPI  Patient is a 87 y.o. female who presents for medication review and management. She is in good spirits and presents with assistance of cane with her niece, Amalia Greenhouse, to appointment. Patient was referred and last seen by provider, Dr. Sande Brothers, on 03/14/21.  Patient did not present with medications in hand but niece read off her medication list to me on her phone.   Niece reports patient lives at [facility] independently but she has hired someone to come in and help bath patient and administer her medications. Niece reports having to hire someone to give patient medications after she found her mixing up her medications post COVID-19 infection. Niece previously filled pill organizers and patient would self-administer medications but reports that since patient had COVID-19 in early June she has had some confusion and "cloudiness of her brain."  Objective:   Labs:   Physical Exam Neurological:     Mental Status: She is alert.    Review of Systems  Cardiovascular:  Negative for leg swelling.   Assessment/Plan:   At present patient does not have good understanding of her medications but her niece does. No concerns of patient missing doses of medications due to aid administering for her currently. Discussed with niece to schedule appointment with me if she needs assistance with medications in the future. Patient was provided with a printed medication list.   Follow-up with provider at next scheduled appointment. Written patient instructions provided.  This appointment required 24 minutes of direct patient care.  Thank you for involving pharmacy to assist in providing this patient's care.

## 2021-04-17 NOTE — Assessment & Plan Note (Signed)
Diane Skinner has asymptomatic chronic bradycardia. She continues to deny any symptoms today. She is not on any medications with negative chronotropic effect. Will continue to monitor for now, no further work-up needed at this time.

## 2021-04-17 NOTE — Assessment & Plan Note (Signed)
BP Readings from Last 3 Encounters:  04/17/21 (!) 148/71  03/15/21 (!) 146/97  03/14/21 (!) 171/78   Blood pressure remains stable from previous visit. She denies any chest pain, palpitations, lightheadedness. She is currently spending a few hours outside daily with a recent episode of dehydration. I am hesitant to add another blood pressure medication or increase her dosages for risk of dizziness and ultimately a fall. Will continue with her current blood pressure medication, can discuss further titration as needed at next appointment.  - Losartan 25mg  daily - Amlodipine 10mg  daily - BMP at next visit

## 2021-04-17 NOTE — Progress Notes (Signed)
   CC: blood pressure follow-up  HPI:  Ms.Earline S Camarena is a 87 y.o. with medical history as below presenting to [facility] for blood pressure follow-up.  Please see problem-based list for further details, assessments, and plans.  Past Medical History:  Diagnosis Date   Arthritis    Hypertension    Renal disorder    Review of Systems:  As per HPI  Physical Exam:  Vitals:   04/17/21 0945  BP: (!) 148/71  Pulse: (!) 53  Temp: 98.3 F (36.8 C)  TempSrc: Oral  Height: 5\' 6"  (1.676 m)   General: Resting comfortably in chair in no acute distress HENT: Normocephalic, atraumatic. CV: Bradycardic, regular rhythm. No murmurs, rubs, gallops. Distal pulses 2+ bilaterally. Pulm: Normal work of breathing on room air. Clear to auscultation bilaterally. MSK: Decreased bulk throughout. No pitting edema bilaterally. Skin: Non-blanching, closed sacral wound superior and left of intergluteal cleft. Mild fluctuance upon palpation, no induration or drainage. Neuro: Awake, alert, intermittently conversing appropriately. Psych: Normal mood, affect, speech.  Assessment & Plan:   See Encounters Tab for problem based charting.  Patient discussed with Dr.  

## 2021-04-17 NOTE — Patient Instructions (Signed)
Diane Skinner it was a pleasure seeing you today.   Today we reviewed all of the medications you are currently taking. Included is an updated medication list. Please continue taking all medications as prescribed on this list.  If you have any questions please call the clinic and ask to speak with me.  Follow-up with provider at next schedule appointment.

## 2021-04-17 NOTE — Assessment & Plan Note (Signed)
[name] reports a sacral wound that has been present "for awhile." She has just recently established with our clinic, unsure of previous history. Per Diane Skinner niece accompanying her today, previously was opened but has never been infected. Diane Skinner continues to report some pain in the area, although this has improved. She has been using rubbing alcohol on the area daily. Of note, she has aides that help administer her medications daily at her independent living facility.  On exam, there is a non-blanching, closed wound superior and to the left of intergluteal cleft with mild fluctuance. I discussed with Diane Skinner and her niece that she should not sit on the area for prolonged periods of time. I have also instructed them to get barrier cream to help decrease friction and decrease risk of opening the wound.  - Over the counter barrier cream daily - Avoid prolonged periods of pressure on the area - Will need to check the wound with each subsequent visit

## 2021-04-21 NOTE — Progress Notes (Signed)
Internal Medicine Clinic Attending ? ?Case discussed with Dr. Braswell  At the time of the visit.  We reviewed the resident?s history and exam and pertinent patient test results.  I agree with the assessment, diagnosis, and plan of care documented in the resident?s note.  ?

## 2021-04-24 ENCOUNTER — Ambulatory Visit (HOSPITAL_BASED_OUTPATIENT_CLINIC_OR_DEPARTMENT_OTHER): Payer: Medicare PPO | Admitting: Family Medicine

## 2021-06-03 ENCOUNTER — Telehealth: Payer: Self-pay

## 2021-06-03 NOTE — Telephone Encounter (Signed)
Please assign pcp and route back to refill pool

## 2021-06-03 NOTE — Telephone Encounter (Signed)
losartan (COZAAR) 25 MG tablet,  amLODipine (NORVASC) 10 MG tablet,  TRAZODONE HCL PO, 50MG    OMEPRAZOLE 20MG ,  GABAPENTIN 300MG ,.   POTASSIUM 50MG , [facility]LL [facility] @ [id number] [place], [place] - [address]

## 2021-06-19 NOTE — Telephone Encounter (Signed)
Last OV 04/17/2021  Next Appt With Internal Medicine Ellison Carwin, MD)06/25/2021 at  2:30 PM

## 2021-06-25 ENCOUNTER — Encounter: Payer: Medicare PPO | Admitting: Internal Medicine

## 2021-07-05 ENCOUNTER — Other Ambulatory Visit: Payer: Self-pay

## 2021-07-05 DIAGNOSIS — I10 Essential (primary) hypertension: Secondary | ICD-10-CM

## 2021-07-05 DIAGNOSIS — N289 Disorder of kidney and ureter, unspecified: Secondary | ICD-10-CM

## 2021-07-10 ENCOUNTER — Encounter: Payer: Self-pay | Admitting: *Deleted

## 2021-07-10 ENCOUNTER — Encounter: Payer: Self-pay | Admitting: Student

## 2021-07-10 NOTE — Progress Notes (Unsigned)

## 2021-07-10 NOTE — Progress Notes (Unsigned)
Things That May Be Affecting Your Health:  Alcohol  Hearing loss  Pain   X Depression  Home Safety  Sexual Health   Diabetes  Lack of physical activity  Stress  X Difficulty with daily activities X Loneliness  Tiredness   Drug use  Medicines  Tobacco use  X Falls  Motor Vehicle Safety  Weight   Food choices  Oral Health  Other    YOUR PERSONALIZED HEALTH PLAN : 1. Schedule your next subsequent Medicare Wellness visit in one year 2. Attend all of your regular appointments to address your medical issues 3. Complete the preventative screenings and services   Annual Wellness Visit   Medicare Covered Preventative Screenings and Services  Services & Screenings Men and Women Who How Often Need? Date of Last Service Action  Abdominal Aortic Aneurysm Adults with AAA risk factors Once      Alcohol Misuse and Counseling All Adults Screening once a year if no alcohol misuse. Counseling up to 4 face to face sessions.     Bone Density Measurement  Adults at risk for osteoporosis Once every 2 yrs X N/A    Lipid Panel Z13.6 All adults without CV disease Once every 5 yrs       Colorectal Cancer  Stool sample or Colonoscopy All adults 50 and older  Once every year Every 10 years        Depression All Adults Once a year X Today   Diabetes Screening Blood glucose, post glucose load, or GTT Z13.1 All adults at risk Pre-diabetics Once per year Twice per year      Diabetes  Self-Management Training All adults Diabetics 10 hrs first year; 2 hours subsequent years. Requires Copay     Glaucoma Diabetics Family history of glaucoma African Americans 50 yrs + Hispanic Americans 65 yrs + Annually - requires coppay      Hepatitis C Z72.89 or F19.20 High Risk for HCV Born between 1945 and 1965 Annually Once      HIV Z11.4 All adults based on risk Annually btw ages 56 & 9 regardless of risk Annually > 65 yrs if at increased risk      Lung Cancer Screening Asymptomatic adults aged 14-77 with  30 pack yr history and current smoker OR quit within the last 15 yrs Annually Must have counseling and shared decision making documentation before first screen      Medical Nutrition Therapy Adults with  Diabetes Renal disease Kidney transplant within past 3 yrs 3 hours first year; 2 hours subsequent years     Obesity and Counseling All adults Screening once a year Counseling if BMI 30 or higher  Today   Tobacco Use Counseling Adults who use tobacco  Up to 8 visits in one year     Vaccines Z23 Hepatitis B Influenza  Pneumonia  Adults  Once Once every flu season Two different vaccines separated by one year X 06/2019 She is due for this  Next Annual Wellness Visit People with Medicare Every year  Today     Services & Screenings Women Who How Often Need  Date of Last Service Action  Mammogram  Z12.31 Women over 40 One baseline ages 3-39. Annually ager 40 yrs+      Pap tests All women Annually if high risk. Every 2 yrs for normal risk women      Screening for cervical cancer with  Pap (Z01.419 nl or Z01.411abnl) & HPV Z11.51 Women aged 46 to 55 Once every 5 yrs  Screening pelvic and breast exams All women Annually if high risk. Every 2 yrs for normal risk women     Sexually Transmitted Diseases Chlamydia Gonorrhea Syphilis All at risk adults Annually for non pregnant females at increased risk         Services & Screenings Men Who How Ofter Need  Date of Last Service Action  Prostate Cancer - DRE & PSA Men over 50 Annually.  DRE might require a copay.        Sexually Transmitted Diseases Syphilis All at risk adults Annually for men at increased risk      Health Maintenance List Health Maintenance  Topic Date Due   Pneumonia Vaccine 85+ Years old (1 - PCV) Never done   TETANUS/TDAP  Never done   Zoster Vaccines- Shingrix (1 of 2) Never done   DEXA SCAN  Never done   COVID-19 Vaccine (4 - Booster) 10/18/2020   INFLUENZA VACCINE  04/22/2021   HPV VACCINES  Aged  Out

## 2022-01-02 ENCOUNTER — Other Ambulatory Visit: Payer: Self-pay | Admitting: Student

## 2022-01-02 DIAGNOSIS — N289 Disorder of kidney and ureter, unspecified: Secondary | ICD-10-CM

## 2022-01-02 DIAGNOSIS — I10 Essential (primary) hypertension: Secondary | ICD-10-CM

## 2022-02-22 IMAGING — CT CT HEAD W/O CM
4 series · 15 of 47 positions shown, 17 images · non-contrast
Comparison: None.

CLINICAL DATA: Mental status change.

EXAM:
CT HEAD WITHOUT CONTRAST
TECHNIQUE: Contiguous axial images were obtained from the base of the skull
through the vertex without intravenous contrast.

[Series 3: head bone · axial · 0.43mm/px · z∈[-114,-98]mm · 2 of 78 slices shown]
[im 8/78  bone]
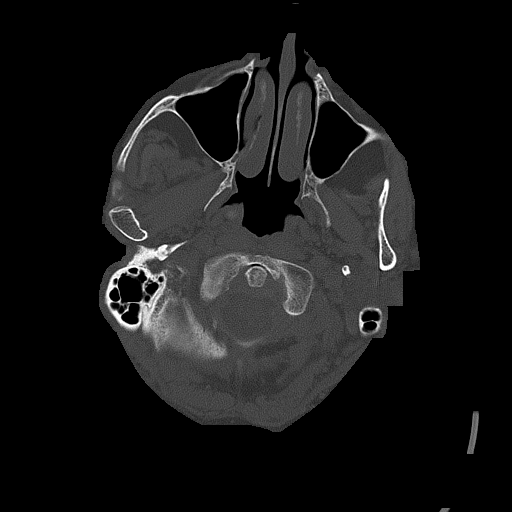
[im 16/78  bone]
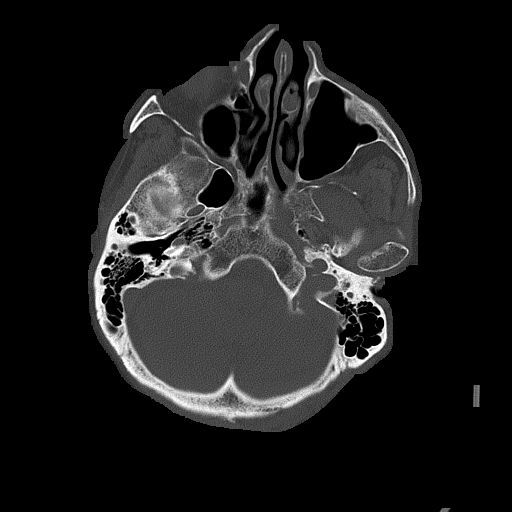

[Series 4: head without · axial · non-contrast · 0.43mm/px · z∈[-112,+2]mm · 7 of 31 slices shown, 9 images]
[im 4/31  brain]
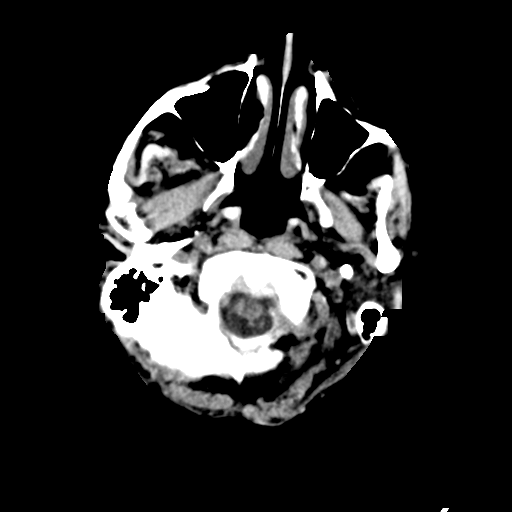
[im 4/31  bone]
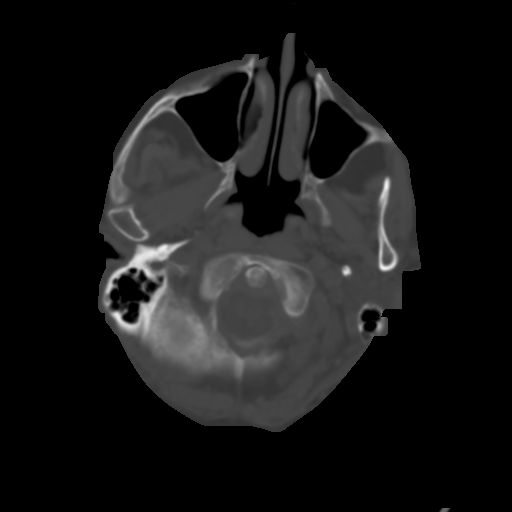
[im 8/31  brain]
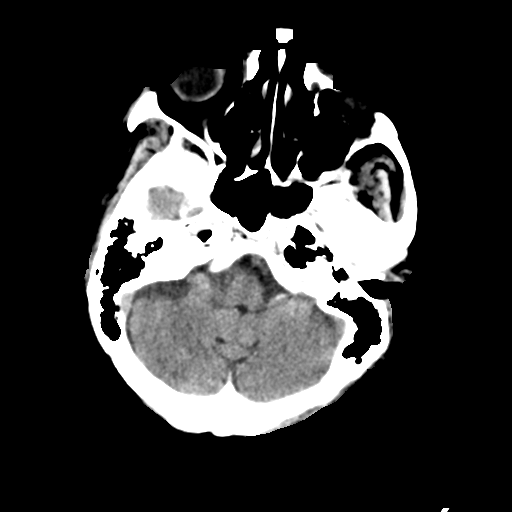
[im 12/31  brain]
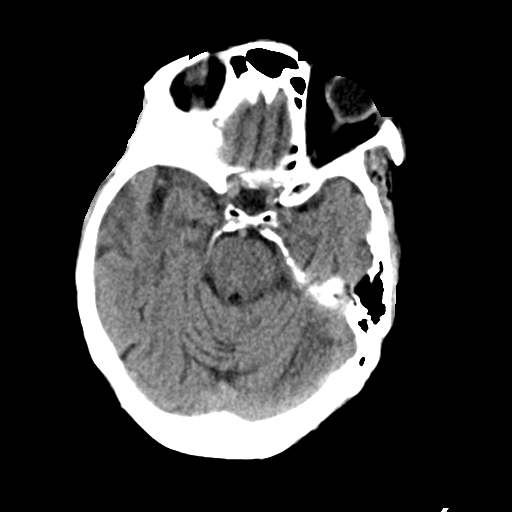
[im 16/31  brain]
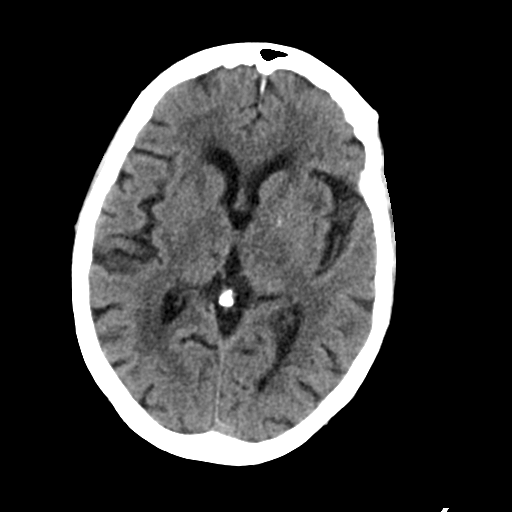
[im 19/31  brain]
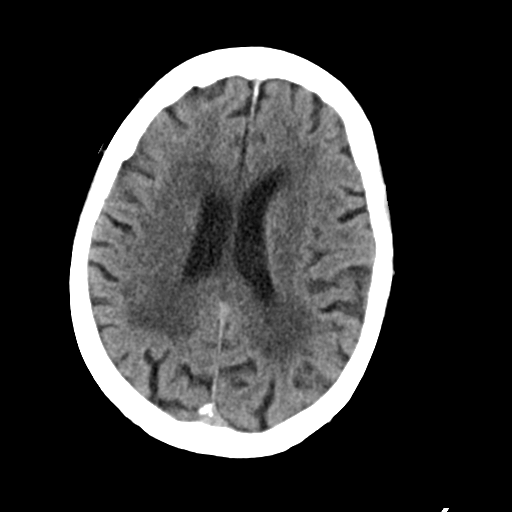
[im 19/31  bone]
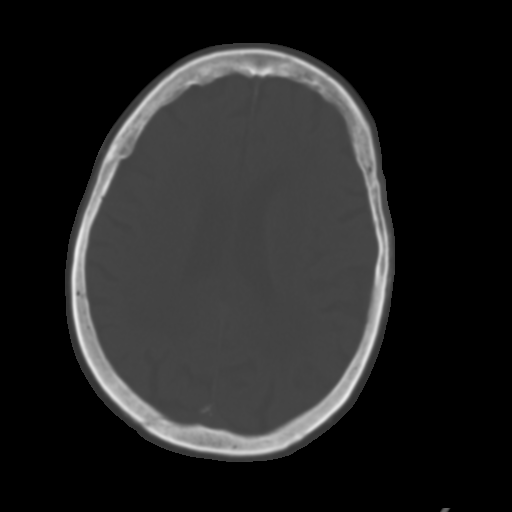
[im 23/31  brain]
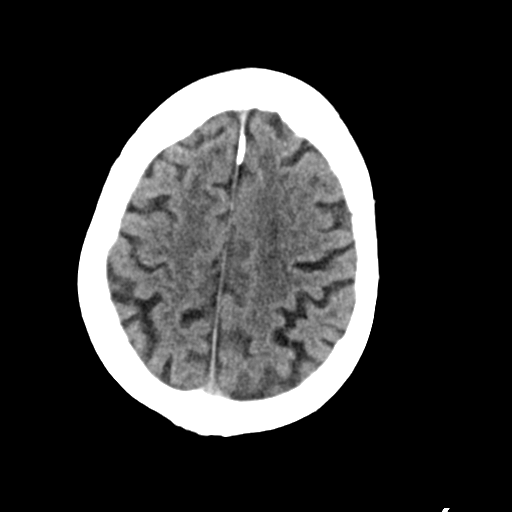
[im 27/31  brain]
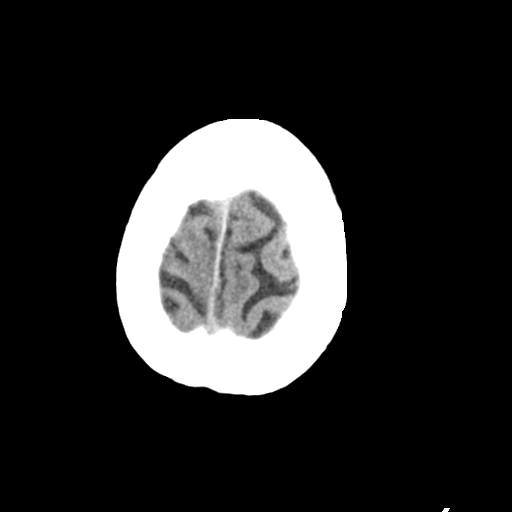

[Series 5: head without cor · coronal · non-contrast · 0.26mm/px · 3 of 67 slices shown]
[im 23/67  brain]
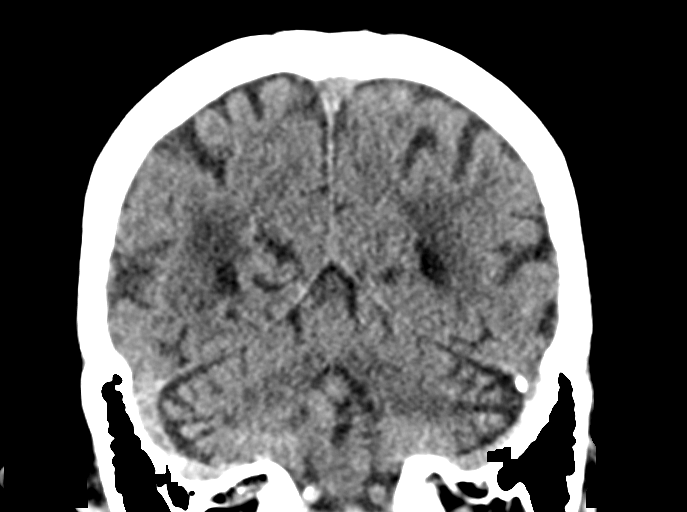
[im 30/67  brain]
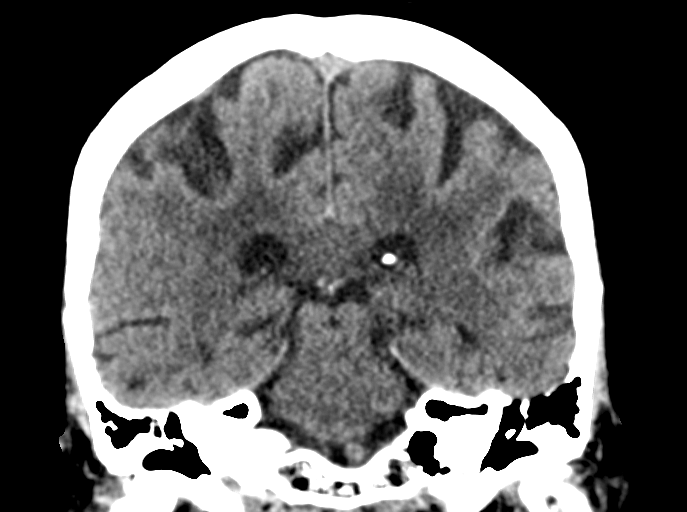
[im 37/67  brain]
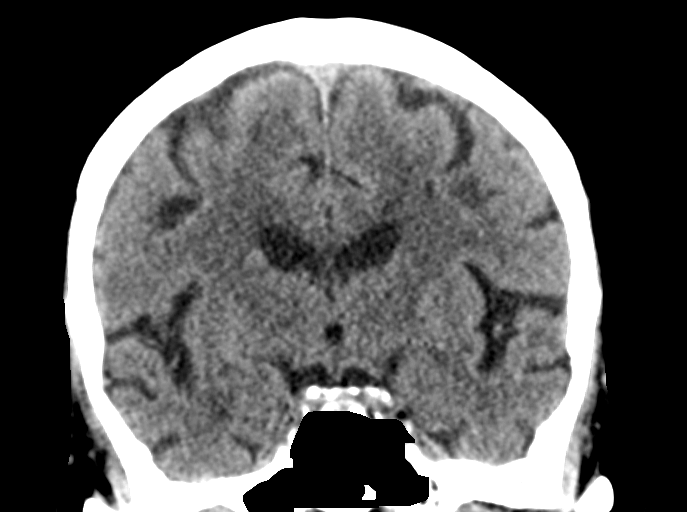

[Series 6: head without sag · sagittal · non-contrast · 0.28mm/px · 3 of 67 slices shown]
[im 23/67  brain]
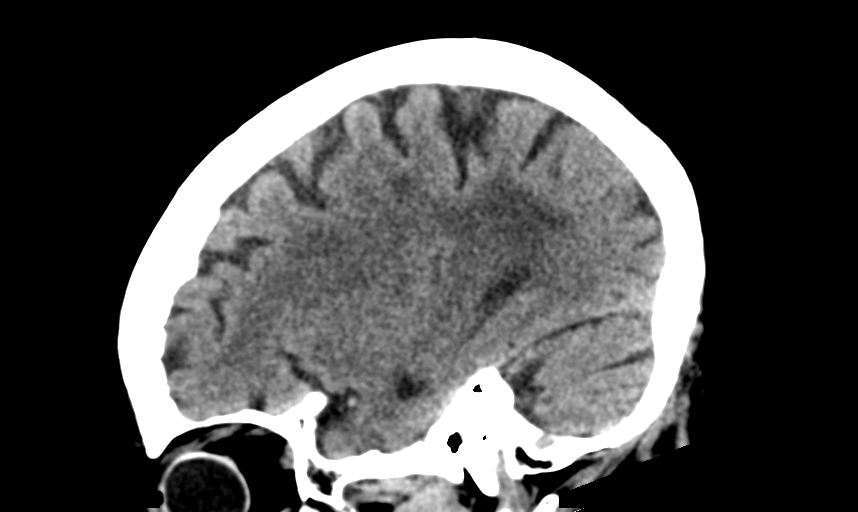
[im 34/67  brain]
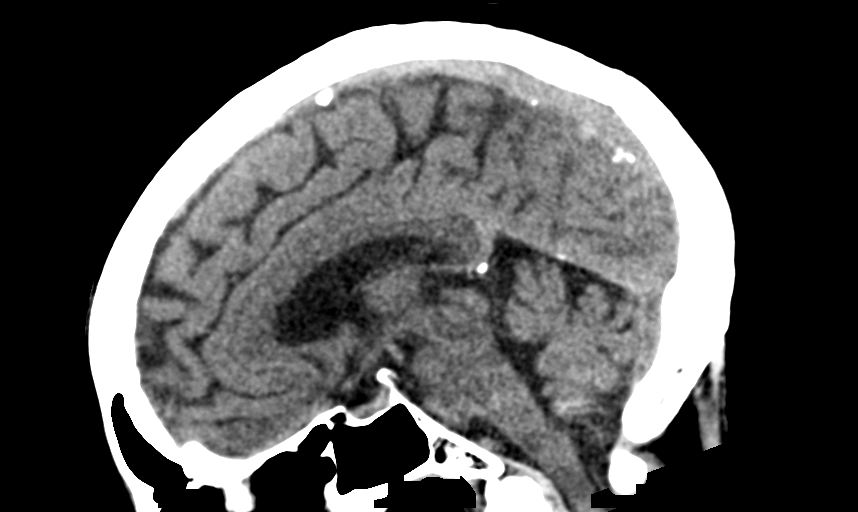
[im 45/67  brain]
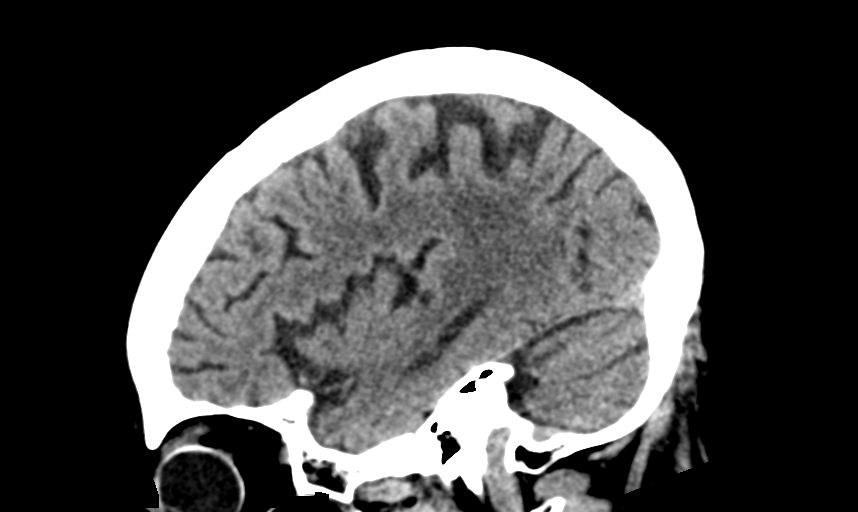

[15 of 47 positions shown; findings below may reference images not displayed]

FINDINGS: Mildly motion limited evaluation.

Brain: No evidence of acute large vascular territory infarction,
hemorrhage, hydrocephalus, extra-axial collection or mass
lesion/mass effect. Moderate patchy white matter hypoattenuation,
nonspecific but most likely related to chronic microvascular
ischemic disease. Mild for age atrophy.

Vascular: Calcific atherosclerosis. No hyperdense vessel identified.

Skull: No acute fracture.

Sinuses/Orbits: Mild paranasal sinus mucosal thickening. No acute
orbital findings.

Other: No mastoid effusions.
IMPRESSION: 1. No evidence of acute intracranial abnormality.
2. Moderate chronic microvascular ischemic disease and mild atrophy.

## 2022-02-25 ENCOUNTER — Other Ambulatory Visit: Payer: Self-pay | Admitting: Student

## 2022-02-25 DIAGNOSIS — I10 Essential (primary) hypertension: Secondary | ICD-10-CM

## 2022-02-25 NOTE — Telephone Encounter (Signed)
Patient needs follow up appointment.  Last seen 04/12/2021.

## 2022-03-16 ENCOUNTER — Encounter: Payer: Self-pay | Admitting: *Deleted

## 2022-04-17 ENCOUNTER — Encounter: Payer: Medicare PPO | Admitting: Student

## 2022-06-22 ENCOUNTER — Emergency Department (HOSPITAL_COMMUNITY)
Admission: EM | Admit: 2022-06-22 | Discharge: 2022-06-22 | Disposition: A | Discharge status: Home / Self Care | Payer: Medicare PPO | Attending: Emergency Medicine | Admitting: Emergency Medicine

## 2022-06-22 ENCOUNTER — Emergency Department (HOSPITAL_COMMUNITY): Payer: Medicare PPO

## 2022-06-22 ENCOUNTER — Other Ambulatory Visit: Payer: Self-pay

## 2022-06-22 ENCOUNTER — Encounter (HOSPITAL_COMMUNITY): Payer: Self-pay

## 2022-06-22 DIAGNOSIS — R410 Disorientation, unspecified: Secondary | ICD-10-CM | POA: Diagnosis not present

## 2022-06-22 DIAGNOSIS — Z79899 Other long term (current) drug therapy: Secondary | ICD-10-CM | POA: Insufficient documentation

## 2022-06-22 DIAGNOSIS — I10 Essential (primary) hypertension: Secondary | ICD-10-CM | POA: Insufficient documentation

## 2022-06-22 DIAGNOSIS — R109 Unspecified abdominal pain: Secondary | ICD-10-CM

## 2022-06-22 LAB — BASIC METABOLIC PANEL
Anion gap: 5 (ref 5–15)
BUN: 31 mg/dL — ABNORMAL HIGH (ref 8–23)
CO2: 24 mmol/L (ref 22–32)
Calcium: 9.1 mg/dL (ref 8.9–10.3)
Chloride: 113 mmol/L — ABNORMAL HIGH (ref 98–111)
Creatinine, Ser: 2.09 mg/dL — ABNORMAL HIGH (ref 0.44–1.00)
GFR, Estimated: 21 mL/min — ABNORMAL LOW (ref >60)
Glucose, Bld: 95 mg/dL (ref 70–99)
Potassium: 4.4 mmol/L (ref 3.5–5.1)
Sodium: 142 mmol/L (ref 135–145)

## 2022-06-22 LAB — CBC
HCT: 39 % (ref 36.0–46.0)
Hemoglobin: 12.2 g/dL (ref 12.0–15.0)
MCH: 26 pg (ref 26.0–34.0)
MCHC: 31.3 g/dL (ref 30.0–36.0)
MCV: 83 fL (ref 80.0–100.0)
Platelets: 130 10*3/uL — ABNORMAL LOW (ref 150–400)
RBC: 4.7 MIL/uL (ref 3.87–5.11)
RDW: 14.2 % (ref 11.5–15.5)
WBC: 6.5 10*3/uL (ref 4.0–10.5)
nRBC: 0 % (ref 0.0–0.2)

## 2022-06-22 LAB — URINALYSIS, ROUTINE W REFLEX MICROSCOPIC
Bacteria, UA: NONE SEEN
Bilirubin Urine: NEGATIVE
Glucose, UA: NEGATIVE mg/dL
Ketones, ur: NEGATIVE mg/dL
Leukocytes,Ua: NEGATIVE
Nitrite: NEGATIVE
Protein, ur: >=300 mg/dL — AB
Specific Gravity, Urine: 1.014 (ref 1.005–1.030)
pH: 5 (ref 5.0–8.0)

## 2022-06-22 LAB — LIPASE, BLOOD: Lipase: 33 U/L (ref 11–51)

## 2022-06-22 NOTE — ED Triage Notes (Signed)
Patient arrived EMS for flank pain on the left side. Patient appeared confused on scene (A&Ox3). Patient is hard of hearing .

## 2022-06-22 NOTE — Discharge Instructions (Signed)
Your work-up was reassuring today.  Follow-up with your doctor as needed.

## 2022-06-22 NOTE — ED Provider Notes (Signed)
[facility]-EMERGENCY DEPT Provider Note   CSN: [id number] Arrival date & time: 06/22/22  0555     History  Chief Complaint  Patient presents with   Flank Pain   Altered Mental Status    Diane Skinner is a 88 y.o. female.   Flank Pain  Altered Mental Status Seen a lot of patients today patient presents with left flank pain.  Reportedly pain earlier today.  Somewhat difficult to get history.  Hurts on left abdomen.  No nausea or vomiting.  No dysuria.  Reportedly had some confusion but is somewhat difficult to elicit of this is from difficulty hearing and her compensating for it or confusion.    Past Medical History:  Diagnosis Date   Arthritis    Hypertension    Renal disorder     Home Medications Prior to Admission medications   Medication Sig Start Date End Date Taking? Authorizing Provider  amLODipine (NORVASC) 10 MG tablet TAKE 1 TABLET BY MOUTH EVERY DAY 02/25/22   Ellison Carwin, MD  Cyanocobalamin (VITAMIN B12 PO) Take by mouth at bedtime.    [provider]  losartan (COZAAR) 25 MG tablet Take 1 tablet (25 mg total) by mouth daily. 04/11/21 04/11/22  Evlyn Kanner, MD  Multiple Vitamins-Minerals (THERA-M PO) Take by mouth daily. Patient not taking: Reported on 04/17/2021    [provider]  OMEPRAZOLE PO Take by mouth daily.    [provider]  TRAZODONE HCL PO Take by mouth at bedtime.    [provider]      Allergies    Patient has no known allergies.    Review of Systems   Review of Systems  Genitourinary:  Positive for flank pain.    Physical Exam Updated Vital Signs BP (!) 143/85 (BP Location: Right Arm)   Pulse 67   Temp 98.5 F (36.9 C) (Oral)   Resp 17   SpO2 100%  Physical Exam Vitals and nursing note reviewed.  HENT:     Head: Atraumatic.  Cardiovascular:     Rate and Rhythm: Regular rhythm.  Abdominal:     Tenderness: There is abdominal tenderness.     Comments: Tenderness  to left upper abdomen.  No rebound or guarding.  No rash.  Skin:    General: Skin is warm.     Capillary Refill: Capillary refill takes less than 2 seconds.  Neurological:     Mental Status: She is alert.     Comments: Awake and pleasant, but some confusion     ED Results / Procedures / Treatments   Labs (all labs ordered are listed, but only abnormal results are displayed) Labs Reviewed  URINALYSIS, ROUTINE W REFLEX MICROSCOPIC - Abnormal; Notable for the following components:      Result Value   Hgb urine dipstick SMALL (*)    Protein, ur >=300 (*)    All other components within normal limits  BASIC METABOLIC PANEL - Abnormal; Notable for the following components:   Chloride 113 (*)    BUN 31 (*)    Creatinine, Ser 2.09 (*)    GFR, Estimated 21 (*)    All other components within normal limits  CBC - Abnormal; Notable for the following components:   Platelets 130 (*)    All other components within normal limits  LIPASE, BLOOD    EKG None  Radiology CT Renal Stone Study  Result Date: 06/22/2022 CLINICAL DATA:  Left-sided flank pain. Concern for kidney stone. Altered mental  status. EXAM: CT ABDOMEN AND PELVIS WITHOUT CONTRAST TECHNIQUE: Multidetector CT imaging of the abdomen and pelvis was performed following the standard protocol without IV contrast. RADIATION DOSE REDUCTION: This exam was performed according to the departmental dose-optimization program which includes automated exposure control, adjustment of the mA and/or kV according to patient size and/or use of iterative reconstruction technique. COMPARISON:  None Available. FINDINGS: Lower chest: No acute abnormality. Hepatobiliary: No focal liver abnormality is seen. Status post cholecystectomy. No biliary dilatation. Pancreas: Unremarkable. No pancreatic ductal dilatation or surrounding inflammatory changes. Spleen: Normal in size without focal abnormality. Adrenals/Urinary Tract: No adrenal masses. Left renal cortical  thinning. No renal masses or stones. No hydronephrosis. Normal ureters. Normal bladder. Stomach/Bowel: Normal stomach. Small bowel and colon are normal in caliber. No wall thickening. No inflammation. Multiple left colon diverticula. No diverticulitis. Vascular/Lymphatic: Aortic atherosclerotic calcifications. No aneurysm. No enlarged lymph nodes. Reproductive: Status post hysterectomy. No adnexal masses. Other: No abdominal wall hernia or abnormality. No abdominopelvic ascites. Musculoskeletal: No fracture or acute finding.  No bone lesion. IMPRESSION: 1. No acute findings within the abdomen or pelvis. No renal or ureteral stones or obstructive uropathy. 2. Aortic atherosclerosis. 3. Left colon diverticula without evidence of diverticulitis. Electronically Signed   By: Lajean Manes M.D.   On: 06/22/2022 09:09    Procedures Procedures    Medications Ordered in ED Medications - No data to display  ED Course/ Medical Decision Making/ A&P                           Medical Decision Making Amount and/or Complexity of Data Reviewed Labs: ordered. Radiology: ordered.   Patient with left-sided abdominal pain.  Reported confusion.  Somewhat difficult to determine whether this is confusion or just compensating for difficulty hearing.  Will get urinalysis some blood work and CT scan.  With tenderness in left upper quadrant pathology such as pancreatitis and UTI are considered. CT scan done without contrast due to some chronic kidney disease.  CT scan reassuring.  Urine does not show infection.  No trauma.  Appears stable for discharge back to nursing home.  Has had no intracranial trauma.        Final Clinical Impression(s) / ED Diagnoses Final diagnoses:  Flank pain    Rx / DC Orders ED Discharge Orders     None         Davonna Belling, MD 06/22/22 1453

## 2022-06-22 NOTE — ED Notes (Addendum)
Pt resides at [facility]. Pt niece updated. Fleeta Emmer Crear [phone].

## 2022-12-05 ENCOUNTER — Inpatient Hospital Stay (HOSPITAL_COMMUNITY)
Admission: EM | Admit: 2022-12-05 | Discharge: 2022-12-10 | DRG: 177 | Disposition: A | Discharge status: Home Health | Payer: Medicare PPO | Attending: Internal Medicine | Admitting: Internal Medicine

## 2022-12-05 ENCOUNTER — Emergency Department (HOSPITAL_COMMUNITY): Payer: Medicare PPO

## 2022-12-05 ENCOUNTER — Encounter (HOSPITAL_COMMUNITY): Payer: Self-pay | Admitting: Radiology

## 2022-12-05 ENCOUNTER — Other Ambulatory Visit: Payer: Self-pay

## 2022-12-05 ENCOUNTER — Observation Stay (HOSPITAL_COMMUNITY): Payer: Medicare PPO

## 2022-12-05 DIAGNOSIS — R0902 Hypoxemia: Secondary | ICD-10-CM

## 2022-12-05 DIAGNOSIS — J9601 Acute respiratory failure with hypoxia: Secondary | ICD-10-CM | POA: Diagnosis present

## 2022-12-05 DIAGNOSIS — W19XXXA Unspecified fall, initial encounter: Principal | ICD-10-CM | POA: Diagnosis present

## 2022-12-05 DIAGNOSIS — I129 Hypertensive chronic kidney disease with stage 1 through stage 4 chronic kidney disease, or unspecified chronic kidney disease: Secondary | ICD-10-CM | POA: Diagnosis present

## 2022-12-05 DIAGNOSIS — G629 Polyneuropathy, unspecified: Secondary | ICD-10-CM

## 2022-12-05 DIAGNOSIS — R296 Repeated falls: Secondary | ICD-10-CM

## 2022-12-05 DIAGNOSIS — R0602 Shortness of breath: Secondary | ICD-10-CM

## 2022-12-05 DIAGNOSIS — H9110 Presbycusis, unspecified ear: Secondary | ICD-10-CM | POA: Diagnosis present

## 2022-12-05 DIAGNOSIS — E875 Hyperkalemia: Secondary | ICD-10-CM | POA: Diagnosis present

## 2022-12-05 DIAGNOSIS — Y92009 Unspecified place in unspecified non-institutional (private) residence as the place of occurrence of the external cause: Secondary | ICD-10-CM | POA: Diagnosis not present

## 2022-12-05 DIAGNOSIS — F0390 Unspecified dementia without behavioral disturbance: Secondary | ICD-10-CM | POA: Diagnosis present

## 2022-12-05 DIAGNOSIS — R059 Cough, unspecified: Secondary | ICD-10-CM | POA: Diagnosis present

## 2022-12-05 DIAGNOSIS — N179 Acute kidney failure, unspecified: Secondary | ICD-10-CM | POA: Diagnosis present

## 2022-12-05 DIAGNOSIS — I10 Essential (primary) hypertension: Secondary | ICD-10-CM | POA: Diagnosis present

## 2022-12-05 DIAGNOSIS — L89151 Pressure ulcer of sacral region, stage 1: Secondary | ICD-10-CM | POA: Diagnosis present

## 2022-12-05 DIAGNOSIS — J9691 Respiratory failure, unspecified with hypoxia: Secondary | ICD-10-CM | POA: Diagnosis not present

## 2022-12-05 DIAGNOSIS — U071 COVID-19: Principal | ICD-10-CM | POA: Diagnosis present

## 2022-12-05 DIAGNOSIS — Z79899 Other long term (current) drug therapy: Secondary | ICD-10-CM

## 2022-12-05 DIAGNOSIS — N1832 Chronic kidney disease, stage 3b: Secondary | ICD-10-CM | POA: Diagnosis present

## 2022-12-05 DIAGNOSIS — Z8249 Family history of ischemic heart disease and other diseases of the circulatory system: Secondary | ICD-10-CM

## 2022-12-05 DIAGNOSIS — L8990 Pressure ulcer of unspecified site, unspecified stage: Secondary | ICD-10-CM | POA: Insufficient documentation

## 2022-12-05 DIAGNOSIS — R338 Other retention of urine: Secondary | ICD-10-CM | POA: Diagnosis present

## 2022-12-05 DIAGNOSIS — Z6820 Body mass index (BMI) 20.0-20.9, adult: Secondary | ICD-10-CM

## 2022-12-05 DIAGNOSIS — R001 Bradycardia, unspecified: Secondary | ICD-10-CM | POA: Diagnosis not present

## 2022-12-05 DIAGNOSIS — E538 Deficiency of other specified B group vitamins: Secondary | ICD-10-CM | POA: Diagnosis present

## 2022-12-05 DIAGNOSIS — M1990 Unspecified osteoarthritis, unspecified site: Secondary | ICD-10-CM | POA: Diagnosis present

## 2022-12-05 DIAGNOSIS — R051 Acute cough: Secondary | ICD-10-CM

## 2022-12-05 DIAGNOSIS — H9190 Unspecified hearing loss, unspecified ear: Secondary | ICD-10-CM | POA: Diagnosis present

## 2022-12-05 DIAGNOSIS — M25551 Pain in right hip: Secondary | ICD-10-CM | POA: Diagnosis present

## 2022-12-05 DIAGNOSIS — E440 Moderate protein-calorie malnutrition: Secondary | ICD-10-CM | POA: Diagnosis present

## 2022-12-05 DIAGNOSIS — K219 Gastro-esophageal reflux disease without esophagitis: Secondary | ICD-10-CM | POA: Diagnosis present

## 2022-12-05 DIAGNOSIS — D696 Thrombocytopenia, unspecified: Secondary | ICD-10-CM | POA: Diagnosis present

## 2022-12-05 DIAGNOSIS — Z9049 Acquired absence of other specified parts of digestive tract: Secondary | ICD-10-CM

## 2022-12-05 LAB — COMPREHENSIVE METABOLIC PANEL
ALT: 11 U/L (ref 0–44)
AST: 18 U/L (ref 15–41)
Albumin: 3.8 g/dL (ref 3.5–5.0)
Alkaline Phosphatase: 60 U/L (ref 38–126)
Anion gap: 7 (ref 5–15)
BUN: 34 mg/dL — ABNORMAL HIGH (ref 8–23)
CO2: 22 mmol/L (ref 22–32)
Calcium: 8.8 mg/dL — ABNORMAL LOW (ref 8.9–10.3)
Chloride: 109 mmol/L (ref 98–111)
Creatinine, Ser: 1.96 mg/dL — ABNORMAL HIGH (ref 0.44–1.00)
GFR, Estimated: 23 mL/min — ABNORMAL LOW (ref >60)
Glucose, Bld: 92 mg/dL (ref 70–99)
Potassium: 4.7 mmol/L (ref 3.5–5.1)
Sodium: 138 mmol/L (ref 135–145)
Total Bilirubin: 1 mg/dL (ref 0.3–1.2)
Total Protein: 7.8 g/dL (ref 6.5–8.1)

## 2022-12-05 LAB — CBC
HCT: 40.7 % (ref 36.0–46.0)
Hemoglobin: 12.2 g/dL (ref 12.0–15.0)
MCH: 25.3 pg — ABNORMAL LOW (ref 26.0–34.0)
MCHC: 30 g/dL (ref 30.0–36.0)
MCV: 84.3 fL (ref 80.0–100.0)
Platelets: 135 10*3/uL — ABNORMAL LOW (ref 150–400)
RBC: 4.83 MIL/uL (ref 3.87–5.11)
RDW: 14.4 % (ref 11.5–15.5)
WBC: 6.5 10*3/uL (ref 4.0–10.5)
nRBC: 0 % (ref 0.0–0.2)

## 2022-12-05 LAB — LACTIC ACID, PLASMA: Lactic Acid, Venous: 1 mmol/L (ref 0.5–1.9)

## 2022-12-05 LAB — CBC WITH DIFFERENTIAL/PLATELET
Abs Immature Granulocytes: 0.05 10*3/uL (ref 0.00–0.07)
Basophils Absolute: 0 10*3/uL (ref 0.0–0.1)
Basophils Relative: 0 %
Eosinophils Absolute: 0 10*3/uL (ref 0.0–0.5)
Eosinophils Relative: 0 %
HCT: 40.7 % (ref 36.0–46.0)
Hemoglobin: 12.3 g/dL (ref 12.0–15.0)
Immature Granulocytes: 1 %
Lymphocytes Relative: 22 %
Lymphs Abs: 1.9 10*3/uL (ref 0.7–4.0)
MCH: 25.4 pg — ABNORMAL LOW (ref 26.0–34.0)
MCHC: 30.2 g/dL (ref 30.0–36.0)
MCV: 83.9 fL (ref 80.0–100.0)
Monocytes Absolute: 0.8 10*3/uL (ref 0.1–1.0)
Monocytes Relative: 10 %
Neutro Abs: 5.6 10*3/uL (ref 1.7–7.7)
Neutrophils Relative %: 67 %
Platelets: 149 10*3/uL — ABNORMAL LOW (ref 150–400)
RBC: 4.85 MIL/uL (ref 3.87–5.11)
RDW: 14.5 % (ref 11.5–15.5)
WBC: 8.4 10*3/uL (ref 4.0–10.5)
nRBC: 0 % (ref 0.0–0.2)

## 2022-12-05 LAB — CREATININE, SERUM
Creatinine, Ser: 1.86 mg/dL — ABNORMAL HIGH (ref 0.44–1.00)
GFR, Estimated: 24 mL/min — ABNORMAL LOW (ref >60)

## 2022-12-05 LAB — RESP PANEL BY RT-PCR (RSV, FLU A&B, COVID)  RVPGX2
Influenza A by PCR: NEGATIVE
Influenza B by PCR: NEGATIVE
Resp Syncytial Virus by PCR: NEGATIVE
SARS Coronavirus 2 by RT PCR: POSITIVE — AB

## 2022-12-05 LAB — CBG MONITORING, ED: Glucose-Capillary: 84 mg/dL (ref 70–99)

## 2022-12-05 MED ORDER — HEPARIN SODIUM (PORCINE) 5000 UNIT/ML IJ SOLN
5000.0000 [IU] | Freq: Three times a day (TID) | INTRAMUSCULAR | Status: DC
  Administered 2022-12-05 – 2022-12-10 (×14): 5000 [IU] via SUBCUTANEOUS
  Filled 2022-12-05 (×14): qty 1

## 2022-12-05 MED ORDER — ACETAMINOPHEN 650 MG RE SUPP: 650.0000 mg | Freq: Four times a day (QID) | RECTAL | Status: DC | PRN

## 2022-12-05 MED ORDER — DOCUSATE SODIUM 100 MG PO CAPS
100.0000 mg | ORAL_CAPSULE | Freq: Two times a day (BID) | ORAL | Status: DC
  Administered 2022-12-05 – 2022-12-10 (×10): 100 mg via ORAL
  Filled 2022-12-05 (×11): qty 1

## 2022-12-05 MED ORDER — GABAPENTIN 300 MG PO CAPS
600.0000 mg | ORAL_CAPSULE | Freq: Every day | ORAL | Status: DC
  Administered 2022-12-05: 600 mg via ORAL
  Filled 2022-12-05: qty 2

## 2022-12-05 MED ORDER — AMLODIPINE BESYLATE 10 MG PO TABS
10.0000 mg | ORAL_TABLET | Freq: Every day | ORAL | Status: DC
  Administered 2022-12-06 – 2022-12-10 (×5): 10 mg via ORAL
  Filled 2022-12-05 (×5): qty 1

## 2022-12-05 MED ORDER — SODIUM CHLORIDE 0.9 % IV SOLN: INTRAVENOUS | Status: DC

## 2022-12-05 MED ORDER — ONDANSETRON HCL 4 MG/2ML IJ SOLN: 4.0000 mg | Freq: Four times a day (QID) | INTRAMUSCULAR | Status: DC | PRN

## 2022-12-05 MED ORDER — ACETAMINOPHEN 325 MG PO TABS: 650.0000 mg | ORAL_TABLET | Freq: Four times a day (QID) | ORAL | Status: DC | PRN

## 2022-12-05 MED ORDER — ONDANSETRON HCL 4 MG PO TABS: 4.0000 mg | ORAL_TABLET | Freq: Four times a day (QID) | ORAL | Status: DC | PRN

## 2022-12-05 MED ORDER — PHENOL 1.4 % MT LIQD
1.0000 | OROMUCOSAL | Status: DC | PRN
  Administered 2022-12-05: 1 via OROMUCOSAL
  Filled 2022-12-05: qty 177

## 2022-12-05 NOTE — ED Notes (Signed)
ED TO INPATIENT HANDOFF REPORT  ED Nurse Name and Phone #: Clarise Cruz [phone]  S Name/Age/Gender Diane Skinner 89 y.o. female Room/Bed: WA17/WA17  Code Status   Code Status: Full Code  Home/SNF/Other  Patient oriented to: self, place, and situation Is this baseline? Yes   Triage Complete: Triage complete  Chief Complaint COVID-19 [U07.1]  Triage Note Pt BIBA from [facility]. Pt had unwitnessed fall this AM, down for unknown amt of time before being found by staff. Pt states "I just couldn't make it to the toilet", claims no LOC. Not on blood thinners.  No bruising or deformities present during triage assessment.   Aox4  Hx dementia   Allergies No Known Allergies  Level of Care/Admitting Diagnosis ED Disposition     ED Disposition  Admit   Condition  --   Comment  Hospital Area: [place] [[id number]]  Level of Care: Med-Surg [16]  May place patient in observation at [facility] or [place] if equivalent level of care is available:: Yes  Covid Evaluation: Confirmed COVID Positive  Diagnosis: COVID-19 [id number]  Admitting Physician: Lucillie Garfinkel [id number]  Attending Physician: Hollice Gong, Curlene Labrum [id number]          B Medical/Surgery History Past Medical History:  Diagnosis Date   Arthritis    Hypertension    Renal disorder    Past Surgical History:  Procedure Laterality Date   BREAST SURGERY     CHOLECYSTECTOMY       A IV Location/Drains/Wounds Patient Lines/Drains/Airways Status     Active Line/Drains/Airways     Name Placement date Placement time Site Days   Peripheral IV 06/22/22 22 G Anterior;Left Forearm 06/22/22  0741  Forearm  166   Peripheral IV 12/05/22 20 G Anterior;Left;Upper Arm 12/05/22  1216  Arm  less than 1            Intake/Output Last 24 hours No intake or output data in the 24 hours ending 12/05/22 1612  Labs/Imaging Results for orders placed or performed during the hospital  encounter of 12/05/22 (from the past 48 hour(s))  POC CBG, ED     Status: None   Collection Time: 12/05/22 10:17 AM  Result Value Ref Range   Glucose-Capillary 84 70 - 99 mg/dL    Comment: Glucose reference range applies only to samples taken after fasting for at least 8 hours.  Resp panel by RT-PCR (RSV, Flu A&B, Covid) Anterior Nasal Swab     Status: Abnormal   Collection Time: 12/05/22 10:30 AM   Specimen: Anterior Nasal Swab  Result Value Ref Range   SARS Coronavirus 2 by RT PCR POSITIVE (A) NEGATIVE    Comment: (NOTE) SARS-CoV-2 target nucleic acids are DETECTED.  The SARS-CoV-2 RNA is generally detectable in upper respiratory specimens during the acute phase of infection. Positive results are indicative of the presence of the identified virus, but do not rule out bacterial infection or co-infection with other pathogens not detected by the test. Clinical correlation with patient history and other diagnostic information is necessary to determine patient infection status. The expected result is Negative.  Fact Sheet for Patients: EntrepreneurPulse.com.au  Fact Sheet for Healthcare Providers: IncredibleEmployment.be  This test is not yet approved or cleared by the [place] FDA and  has been authorized for detection and/or diagnosis of SARS-CoV-2 by FDA under an Emergency Use Authorization (EUA).  This EUA will remain in effect (meaning this test can be used) for the duration of  the  COVID-19 declaration under Section 564(b)(1) of the A ct, 21 U.S.C. section 360bbb-3(b)(1), unless the authorization is terminated or revoked sooner.     Influenza A by PCR NEGATIVE NEGATIVE   Influenza B by PCR NEGATIVE NEGATIVE    Comment: (NOTE) The Xpert Xpress SARS-CoV-2/FLU/RSV plus assay is intended as an aid in the diagnosis of influenza from Nasopharyngeal swab specimens and should not be used as a sole basis for treatment. Nasal washings  and aspirates are unacceptable for Xpert Xpress SARS-CoV-2/FLU/RSV testing.  Fact Sheet for Patients: EntrepreneurPulse.com.au  Fact Sheet for Healthcare Providers: IncredibleEmployment.be  This test is not yet approved or cleared by the [place] FDA and has been authorized for detection and/or diagnosis of SARS-CoV-2 by FDA under an Emergency Use Authorization (EUA). This EUA will remain in effect (meaning this test can be used) for the duration of the COVID-19 declaration under Section 564(b)(1) of the Act, 21 U.S.C. section 360bbb-3(b)(1), unless the authorization is terminated or revoked.     Resp Syncytial Virus by PCR NEGATIVE NEGATIVE    Comment: (NOTE) Fact Sheet for Patients: EntrepreneurPulse.com.au  Fact Sheet for Healthcare Providers: IncredibleEmployment.be  This test is not yet approved or cleared by the [place] FDA and has been authorized for detection and/or diagnosis of SARS-CoV-2 by FDA under an Emergency Use Authorization (EUA). This EUA will remain in effect (meaning this test can be used) for the duration of the COVID-19 declaration under Section 564(b)(1) of the Act, 21 U.S.C. section 360bbb-3(b)(1), unless the authorization is terminated or revoked.  Performed at [facility], [place] [address], [place], [place] [address]   Lactic acid, plasma     Status: None   Collection Time: 12/05/22 12:15 PM  Result Value Ref Range   Lactic Acid, Venous 1.0 0.5 - 1.9 mmol/L    Comment: Performed at [facility], [place] [address] [place], [place] [address]  CBC with Differential     Status: Abnormal   Collection Time: 12/05/22 12:15 PM  Result Value Ref Range   WBC 8.4 4.0 - 10.5 K/uL   RBC 4.85 3.87 - 5.11 MIL/uL   Hemoglobin 12.3 12.0 - 15.0 g/dL   HCT 40.7 36.0 - 46.0 %   MCV 83.9 80.0 - 100.0 fL   MCH 25.4 (L) 26.0 - 34.0 pg   MCHC 30.2  30.0 - 36.0 g/dL   RDW 14.5 11.5 - 15.5 %   Platelets 149 (L) 150 - 400 K/uL   nRBC 0.0 0.0 - 0.2 %   Neutrophils Relative % 67 %   Neutro Abs 5.6 1.7 - 7.7 K/uL   Lymphocytes Relative 22 %   Lymphs Abs 1.9 0.7 - 4.0 K/uL   Monocytes Relative 10 %   Monocytes Absolute 0.8 0.1 - 1.0 K/uL   Eosinophils Relative 0 %   Eosinophils Absolute 0.0 0.0 - 0.5 K/uL   Basophils Relative 0 %   Basophils Absolute 0.0 0.0 - 0.1 K/uL   Immature Granulocytes 1 %   Abs Immature Granulocytes 0.05 0.00 - 0.07 K/uL    Comment: Performed at [facility], [place] [address] [place], [place] [address]  Comprehensive metabolic panel     Status: Abnormal   Collection Time: 12/05/22 12:15 PM  Result Value Ref Range   Sodium 138 135 - 145 mmol/L   Potassium 4.7 3.5 - 5.1 mmol/L   Chloride 109 98 - 111 mmol/L   CO2 22 22 - 32 mmol/L   Glucose, Bld  92 70 - 99 mg/dL    Comment: Glucose reference range applies only to samples taken after fasting for at least 8 hours.   BUN 34 (H) 8 - 23 mg/dL   Creatinine, Ser 1.96 (H) 0.44 - 1.00 mg/dL   Calcium 8.8 (L) 8.9 - 10.3 mg/dL   Total Protein 7.8 6.5 - 8.1 g/dL   Albumin 3.8 3.5 - 5.0 g/dL   AST 18 15 - 41 U/L   ALT 11 0 - 44 U/L   Alkaline Phosphatase 60 38 - 126 U/L   Total Bilirubin 1.0 0.3 - 1.2 mg/dL   GFR, Estimated 23 (L) >60 mL/min    Comment: (NOTE) Calculated using the CKD-EPI Creatinine Equation (2021)    Anion gap 7 5 - 15    Comment: Performed at [facility], [place] [address] [place], [place] [address]   CT HEAD WO CONTRAST (5MM)  Result Date: 12/05/2022 CLINICAL DATA:  Right history: Head trauma, minor. Additional history provided: Unwitnessed fall. EXAM: CT HEAD WITHOUT CONTRAST TECHNIQUE: Contiguous axial images were obtained from the base of the skull through the vertex without intravenous contrast. RADIATION DOSE REDUCTION: This exam was performed according to the departmental dose-optimization program which  includes automated exposure control, adjustment of the mA and/or kV according to patient size and/or use of iterative reconstruction technique. COMPARISON:  Head CT 03/15/2021. FINDINGS: Brain: Moderate generalized cerebral atrophy. Patchy and ill-defined hypoattenuation within the cerebral white matter, nonspecific but compatible with moderate chronic small vessel ischemic disease. There is no acute intracranial hemorrhage. No demarcated cortical infarct. No extra-axial fluid collection. No evidence of an intracranial mass. No midline shift. Vascular: Hyperdense vessel.  Atherosclerotic calcifications no Skull: No fracture or aggressive osseous lesion. Sinuses/Orbits: No mass or acute finding within the imaged orbits. Mild mucosal thickening within the left frontal sinus. Mild-to-moderate mucosal thickening within bilateral ethmoid air cells. Mild mucosal thickening within the bilateral sphenoid sinuses. Mild mucosal thickening within the bilateral maxillary sinuses at the imaged levels. IMPRESSION: 1.  No evidence of an acute intracranial abnormality. 2. Moderate chronic small vessel ischemic changes within the cerebral white matter. 3. Moderate generalized cerebral atrophy. 4. Paranasal sinus disease at the imaged levels, as described. Electronically Signed   By: Kellie Simmering D.O.   On: 12/05/2022 11:34   DG Chest Portable 1 View  Result Date: 12/05/2022 CLINICAL DATA:  Cough, short of EXAM: PORTABLE CHEST 1 VIEW COMPARISON:  02/28/2021 FINDINGS: Prominent cardiac silhouette. Normal pulmonary vasculature. No evidence of effusion, infiltrate, or pneumothorax. No acute bony abnormality. IMPRESSION: No acute cardiopulmonary process. Electronically Signed   By: Suzy Bouchard M.D.   On: 12/05/2022 10:53    Pending Labs Unresulted Labs (From admission, onward)     Start     Ordered   12/06/22 [id number]  Basic metabolic panel  Tomorrow morning,   R        12/05/22 1559   12/06/22 0500  CBC  Tomorrow morning,    R        12/05/22 1559   12/05/22 1551  CBC  (heparin)  Once,   R       Comments: Baseline for heparin therapy IF NOT ALREADY DRAWN.  Notify MD if PLT < 100 K.    12/05/22 1559   12/05/22 1551  Creatinine, serum  (heparin)  Once,   R       Comments: Baseline for heparin therapy IF NOT ALREADY DRAWN.    12/05/22 1559   12/05/22  1030  Blood culture (routine x 2)  BLOOD CULTURE X 2,   R      12/05/22 1029            Vitals/Pain Today's Vitals   12/05/22 1017 12/05/22 1320 12/05/22 1503 12/05/22 1600  BP:  (!) 147/76  (!) 150/71  Pulse:  (!) 59  (!) 55  Resp:  (!) 22  20  Temp:   98.9 F (37.2 C)   TempSrc:   Oral   SpO2:  96%  99%  Weight: 56.7 kg     Height: 5\' 6"  (1.676 m)     PainSc:        Isolation Precautions No active isolations  Medications Medications  0.9 %  sodium chloride infusion (has no administration in time range)  amLODipine (NORVASC) tablet 10 mg (has no administration in time range)  gabapentin (NEURONTIN) capsule 600 mg (has no administration in time range)  heparin injection 5,000 Units (has no administration in time range)  ondansetron (ZOFRAN) tablet 4 mg (has no administration in time range)    Or  ondansetron (ZOFRAN) injection 4 mg (has no administration in time range)  docusate sodium (COLACE) capsule 100 mg (has no administration in time range)  acetaminophen (TYLENOL) tablet 650 mg (has no administration in time range)    Or  acetaminophen (TYLENOL) suppository 650 mg (has no administration in time range)    Mobility walks with device     Focused Assessments    R Recommendations: See Admitting Provider Note  Report given to:   Additional Notes:

## 2022-12-05 NOTE — ED Triage Notes (Signed)
Pt BIBA from [facility]. Pt had unwitnessed fall this AM, down for unknown amt of time before being found by staff. Pt states "I just couldn't make it to the toilet", claims no LOC. Not on blood thinners.  No bruising or deformities present during triage assessment.   Aox4  Hx dementia

## 2022-12-05 NOTE — H&P (Addendum)
History and Physical  SHERLEAN SALASAR [id number] DOB: Sep 05, 1923 DOA: 12/05/2022   PCP: Drucie Opitz, MD   Patient coming from: Nursing home  Chief Complaint: Found down  HPI: Diane Skinner is a 89 y.o. female with medical history significant for arthritis, hypertension, CKD.  Brought to the emergency department for evaluation after a fall.  She was brought in by ambulance from [facility] this morning, her caregiver found her on the floor.  She said she could not make it to the toilet, I am not sure why but there is a presumption that she did indeed hit her head.  I was able to get more history from the patient's niece who is her power of attorney, who mentions that patient was complaining of some right hip pain when she tried to stand before EMS arrived.  Indicates, workup in the emergency department is relatively unrevealing, with some slight hypertension, lab work is stable at the patient's chronic baseline.  Patient is also having some cold-like symptoms according to her niece.  Patient was screened for COVID in the emergency department and positive.  She was intermittently hypoxic enough to be requiring oxygen.  Due to her advanced age, decision was made to admit the patient for observation given her acute hypoxic respiratory failure.  Review of Systems: Please see HPI for pertinent positives and negatives. A complete 10 system review of systems are otherwise negative.  Past Medical History:  Diagnosis Date   Arthritis    Hypertension    Renal disorder    Past Surgical History:  Procedure Laterality Date   BREAST SURGERY     CHOLECYSTECTOMY      Social History:  reports that she has never smoked. She has never used smokeless tobacco. She reports that she does not currently use alcohol. She reports that she does not currently use drugs.   No Known Allergies  Family History  Problem Relation Age of Onset   Hypertension Mother    Heart disease Mother    Breast  cancer Sister      Prior to Admission medications   Medication Sig Start Date End Date Taking? Authorizing Provider  ofloxacin (FLOXIN) 0.3 % OTIC solution Place 2 drops into both ears 2 (two) times daily. 10/15/22  Yes [provider]  valsartan-hydrochlorothiazide (DIOVAN-HCT) 160-25 MG tablet Take 1 tablet by mouth every morning. 11/24/22  Yes [provider]  amLODipine (NORVASC) 10 MG tablet TAKE 1 TABLET BY MOUTH EVERY DAY 02/25/22   Wayland Denis, MD  Cyanocobalamin (VITAMIN B12 PO) Take by mouth at bedtime.    [provider]  gabapentin (NEURONTIN) 300 MG capsule Take 600 mg by mouth at bedtime.    [provider]  losartan (COZAAR) 25 MG tablet Take 1 tablet (25 mg total) by mouth daily. 04/11/21 04/11/22  Sanjuan Dame, MD  Multiple Vitamins-Minerals (THERA-M PO) Take by mouth daily. Patient not taking: Reported on 04/17/2021    [provider]  OMEPRAZOLE PO Take by mouth daily.    [provider]  TRAZODONE HCL PO Take by mouth at bedtime.    [provider]    Physical Exam: BP (!) 147/76   Pulse (!) 59   Temp 98.9 F (37.2 C) (Oral)   Resp (!) 22   Ht 5\' 6"  (1.676 m)   Wt 56.7 kg   SpO2 96%   BMI 20.18 kg/m   General:  Alert, oriented, calm, in no acute distress, elderly patient on 2 L oxygen,  very hard of hearing and somewhat confused Eyes: EOMI, clear conjuctivae, white sclerea Neck: supple, no masses, trachea mildline  Cardiovascular: RRR, no murmurs or rubs, no peripheral edema  Respiratory: clear to auscultation bilaterally, no wheezes, no crackles  Abdomen: soft, nontender, nondistended, normal bowel tones heard  Skin: dry, no rashes  Musculoskeletal: no joint effusions, normal range of motion  Psychiatric: appropriate affect, normal speech  Neurologic: extraocular muscles intact, clear speech, moving all extremities with intact sensorium          Labs on Admission:  Basic Metabolic  Panel: Recent Labs  Lab 12/05/22 1215  NA 138  K 4.7  CL 109  CO2 22  GLUCOSE 92  BUN 34*  CREATININE 1.96*  CALCIUM 8.8*   Liver Function Tests: Recent Labs  Lab 12/05/22 1215  AST 18  ALT 11  ALKPHOS 60  BILITOT 1.0  PROT 7.8  ALBUMIN 3.8   No results for input(s): "LIPASE", "AMYLASE" in the last 168 hours. No results for input(s): "AMMONIA" in the last 168 hours. CBC: Recent Labs  Lab 12/05/22 1215  WBC 8.4  NEUTROABS 5.6  HGB 12.3  HCT 40.7  MCV 83.9  PLT 149*   Cardiac Enzymes: No results for input(s): "CKTOTAL", "CKMB", "CKMBINDEX", "TROPONINI" in the last 168 hours.  BNP (last 3 results) No results for input(s): "BNP" in the last 8760 hours.  ProBNP (last 3 results) No results for input(s): "PROBNP" in the last 8760 hours.  CBG: Recent Labs  Lab 12/05/22 1017  GLUCAP 84    Radiological Exams on Admission: CT HEAD WO CONTRAST (5MM)  Result Date: 12/05/2022 CLINICAL DATA:  Right history: Head trauma, minor. Additional history provided: Unwitnessed fall. EXAM: CT HEAD WITHOUT CONTRAST TECHNIQUE: Contiguous axial images were obtained from the base of the skull through the vertex without intravenous contrast. RADIATION DOSE REDUCTION: This exam was performed according to the departmental dose-optimization program which includes automated exposure control, adjustment of the mA and/or kV according to patient size and/or use of iterative reconstruction technique. COMPARISON:  Head CT 03/15/2021. FINDINGS: Brain: Moderate generalized cerebral atrophy. Patchy and ill-defined hypoattenuation within the cerebral white matter, nonspecific but compatible with moderate chronic small vessel ischemic disease. There is no acute intracranial hemorrhage. No demarcated cortical infarct. No extra-axial fluid collection. No evidence of an intracranial mass. No midline shift. Vascular: Hyperdense vessel.  Atherosclerotic calcifications no Skull: No fracture or aggressive  osseous lesion. Sinuses/Orbits: No mass or acute finding within the imaged orbits. Mild mucosal thickening within the left frontal sinus. Mild-to-moderate mucosal thickening within bilateral ethmoid air cells. Mild mucosal thickening within the bilateral sphenoid sinuses. Mild mucosal thickening within the bilateral maxillary sinuses at the imaged levels. IMPRESSION: 1.  No evidence of an acute intracranial abnormality. 2. Moderate chronic small vessel ischemic changes within the cerebral white matter. 3. Moderate generalized cerebral atrophy. 4. Paranasal sinus disease at the imaged levels, as described. Electronically Signed   By: Kellie Simmering D.O.   On: 12/05/2022 11:34   DG Chest Portable 1 View  Result Date: 12/05/2022 CLINICAL DATA:  Cough, short of EXAM: PORTABLE CHEST 1 VIEW COMPARISON:  02/28/2021 FINDINGS: Prominent cardiac silhouette. Normal pulmonary vasculature. No evidence of effusion, infiltrate, or pneumothorax. No acute bony abnormality. IMPRESSION: No acute cardiopulmonary process. Electronically Signed   By: Suzy Bouchard M.D.   On: 12/05/2022 10:53    Assessment/Plan Principal Problem:   Fall at home, initial encounter -this was unfortunately unwitnessed.  Patient with no obvious injury.  Probably due to what we now know is COVID-19.  She did apparently complain of some right hip pain soon after the fall. -Observation admission -Supplemental oxygen as needed to keep O2 saturation above 90%; discussed with clinical pharmacist, patient is borderline candidate for remdesivir given her hypoxia.  Since she is clinically stable we will hold off on this for now. -PT/OT eval -Will check stat right hip and pelvis x-rays to rule out acute fracture Active Problems:   Thrombocytopenia ([place])   Hypertension   COVID-19   Hip pain, acute, right   Respiratory failure with hypoxia ([place])   Peripheral neuropathy  DVT prophylaxis: Hep SQ   Code Status: FULL -discussed extensively with her  healthcare power of attorney [place]  Family Communication: Was discussed in detail over the phone with her healthcare provide today Tonya, all questions were answered.  Disposition Plan: Likely back to SNF at discharge.  Consults called: None  Admission status: Observation  Time spent: 35 minutes  Ihor Meinzer Neva Seat MD [facility] Pager [phone]  If 7PM-7AM, please contact night-coverage [email or web address] Password [facility]  12/05/2022, 4:03 PM

## 2022-12-05 NOTE — ED Provider Notes (Signed)
[place] EMERGENCY DEPARTMENT AT [facility] Provider Note   CSN: [id number] Arrival date & time: 12/05/22  [id number]     History  Chief Complaint  Patient presents with   Diane Skinner    CHAMERE Skinner is a 89 y.o. female.  The history is provided by the patient and medical records. No language interpreter was used.  Fall This is a new problem. The current episode started 1 to 2 hours ago. The problem occurs rarely. The problem has been resolved. Associated symptoms include shortness of breath. Pertinent negatives include no chest pain, no abdominal pain and no headaches. Nothing aggravates the symptoms. Nothing relieves the symptoms. She has tried nothing for the symptoms. The treatment provided no relief.  Cough Cough characteristics:  Productive Sputum characteristics:  Nondescript Severity:  Moderate Onset quality:  Gradual Duration:  3 days Timing:  Constant Progression:  Waxing and waning Chronicity:  New Relieved by:  Nothing Worsened by:  Nothing Ineffective treatments:  None tried Associated symptoms: chills and shortness of breath   Associated symptoms: no chest pain, no fever, no headaches, no rash and no wheezing        Home Medications Prior to Admission medications   Medication Sig Start Date End Date Taking? Authorizing Provider  amLODipine (NORVASC) 10 MG tablet TAKE 1 TABLET BY MOUTH EVERY DAY 02/25/22   Wayland Denis, MD  Cyanocobalamin (VITAMIN B12 PO) Take by mouth at bedtime.    [provider]  losartan (COZAAR) 25 MG tablet Take 1 tablet (25 mg total) by mouth daily. 04/11/21 04/11/22  Sanjuan Dame, MD  Multiple Vitamins-Minerals (THERA-M PO) Take by mouth daily. Patient not taking: Reported on 04/17/2021    [provider]  OMEPRAZOLE PO Take by mouth daily.    [provider]  TRAZODONE HCL PO Take by mouth at bedtime.    [provider]      Allergies    Patient has no known allergies.    Review of  Systems   Review of Systems  Constitutional:  Positive for chills. Negative for fatigue and fever.  HENT:  Negative for congestion.   Eyes:  Negative for visual disturbance.  Respiratory:  Positive for cough and shortness of breath. Negative for chest tightness and wheezing.   Cardiovascular:  Negative for chest pain and palpitations.  Gastrointestinal:  Negative for abdominal pain, constipation, diarrhea, nausea and vomiting.  Genitourinary:  Negative for dysuria, flank pain and frequency.  Musculoskeletal:  Negative for back pain, neck pain and neck stiffness.  Skin:  Negative for rash.  Neurological:  Negative for dizziness, weakness, light-headedness, numbness and headaches.  Psychiatric/Behavioral:  Negative for agitation and confusion.   All other systems reviewed and are negative.   Physical Exam Updated Vital Signs BP (!) 147/76   Pulse (!) 59   Temp 98.8 F (37.1 C) (Oral)   Resp (!) 22   Ht 5\' 6"  (1.676 m)   Wt 56.7 kg   SpO2 96%   BMI 20.18 kg/m  Physical Exam Vitals and nursing note reviewed.  Constitutional:      General: She is not in acute distress.    Appearance: She is well-developed. She is not ill-appearing, toxic-appearing or diaphoretic.  HENT:     Head: Normocephalic and atraumatic.     Nose: No congestion or rhinorrhea.     Mouth/Throat:     Mouth: Mucous membranes are moist.     Pharynx: No oropharyngeal exudate or posterior oropharyngeal erythema.  Eyes:  Conjunctiva/sclera: Conjunctivae normal.     Pupils: Pupils are equal, round, and reactive to light.  Cardiovascular:     Rate and Rhythm: Normal rate and regular rhythm.     Heart sounds: No murmur heard. Pulmonary:     Effort: Pulmonary effort is normal. No respiratory distress.     Breath sounds: Rhonchi present. No wheezing or rales.  Chest:     Chest wall: No tenderness.  Abdominal:     General: Abdomen is flat.     Palpations: Abdomen is soft.     Tenderness: There is no  abdominal tenderness. There is no guarding or rebound.  Musculoskeletal:        General: No swelling or tenderness.     Cervical back: Neck supple.  Skin:    General: Skin is warm and dry.     Capillary Refill: Capillary refill takes less than 2 seconds.     Findings: No erythema or rash.  Neurological:     General: No focal deficit present.     Mental Status: She is alert. Mental status is at baseline.  Psychiatric:        Mood and Affect: Mood normal.     ED Results / Procedures / Treatments   Labs (all labs ordered are listed, but only abnormal results are displayed) Labs Reviewed  RESP PANEL BY RT-PCR (RSV, FLU A&B, COVID)  RVPGX2 - Abnormal; Notable for the following components:      Result Value   SARS Coronavirus 2 by RT PCR POSITIVE (*)    All other components within normal limits  CBC WITH DIFFERENTIAL/PLATELET - Abnormal; Notable for the following components:   MCH 25.4 (*)    Platelets 149 (*)    All other components within normal limits  COMPREHENSIVE METABOLIC PANEL - Abnormal; Notable for the following components:   BUN 34 (*)    Creatinine, Ser 1.96 (*)    Calcium 8.8 (*)    GFR, Estimated 23 (*)    All other components within normal limits  CULTURE, BLOOD (ROUTINE X 2)  CULTURE, BLOOD (ROUTINE X 2)  LACTIC ACID, PLASMA  CBC  CREATININE, SERUM  CBG MONITORING, ED    EKG EKG Interpretation  Date/Time:  Friday December 05 2022 10:13:00 EDT Ventricular Rate:  53 PR Interval:  156 QRS Duration: 81 QT Interval:  449 QTC Calculation: 422 R Axis:   -17 Text Interpretation: Sinus rhythm Borderline left axis deviation Low voltage, precordial leads Borderline T abnormalities, inferior leads when compared to prior, more wandering baseline. No STEMI Confirmed by Antony Blackbird [phone]) on 12/05/2022 10:27:41 AM  Radiology CT HEAD WO CONTRAST (5MM)  Result Date: 12/05/2022 CLINICAL DATA:  Right history: Head trauma, minor. Additional history provided:  Unwitnessed fall. EXAM: CT HEAD WITHOUT CONTRAST TECHNIQUE: Contiguous axial images were obtained from the base of the skull through the vertex without intravenous contrast. RADIATION DOSE REDUCTION: This exam was performed according to the departmental dose-optimization program which includes automated exposure control, adjustment of the mA and/or kV according to patient size and/or use of iterative reconstruction technique. COMPARISON:  Head CT 03/15/2021. FINDINGS: Brain: Moderate generalized cerebral atrophy. Patchy and ill-defined hypoattenuation within the cerebral white matter, nonspecific but compatible with moderate chronic small vessel ischemic disease. There is no acute intracranial hemorrhage. No demarcated cortical infarct. No extra-axial fluid collection. No evidence of an intracranial mass. No midline shift. Vascular: Hyperdense vessel.  Atherosclerotic calcifications no Skull: No fracture or aggressive osseous lesion. Sinuses/Orbits: No mass or  acute finding within the imaged orbits. Mild mucosal thickening within the left frontal sinus. Mild-to-moderate mucosal thickening within bilateral ethmoid air cells. Mild mucosal thickening within the bilateral sphenoid sinuses. Mild mucosal thickening within the bilateral maxillary sinuses at the imaged levels. IMPRESSION: 1.  No evidence of an acute intracranial abnormality. 2. Moderate chronic small vessel ischemic changes within the cerebral white matter. 3. Moderate generalized cerebral atrophy. 4. Paranasal sinus disease at the imaged levels, as described. Electronically Signed   By: Kellie Simmering D.O.   On: 12/05/2022 11:34   DG Chest Portable 1 View  Result Date: 12/05/2022 CLINICAL DATA:  Cough, short of EXAM: PORTABLE CHEST 1 VIEW COMPARISON:  02/28/2021 FINDINGS: Prominent cardiac silhouette. Normal pulmonary vasculature. No evidence of effusion, infiltrate, or pneumothorax. No acute bony abnormality. IMPRESSION: No acute cardiopulmonary  process. Electronically Signed   By: Suzy Bouchard M.D.   On: 12/05/2022 10:53    Procedures Procedures    Medications Ordered in ED Medications  0.9 %  sodium chloride infusion (has no administration in time range)  amLODipine (NORVASC) tablet 10 mg (has no administration in time range)  gabapentin (NEURONTIN) capsule 600 mg (has no administration in time range)  heparin injection 5,000 Units (has no administration in time range)  ondansetron (ZOFRAN) tablet 4 mg (has no administration in time range)    Or  ondansetron (ZOFRAN) injection 4 mg (has no administration in time range)  docusate sodium (COLACE) capsule 100 mg (has no administration in time range)  acetaminophen (TYLENOL) tablet 650 mg (has no administration in time range)    Or  acetaminophen (TYLENOL) suppository 650 mg (has no administration in time range)    ED Course/ Medical Decision Making/ A&P                             Medical Decision Making Amount and/or Complexity of Data Reviewed Labs: ordered. Radiology: ordered.  Risk Decision regarding hospitalization.    Moriah CARALINE ZONG is a 89 y.o. female with a past medical history significant for hypertension, arthritis, and previous cholecystectomy who presents with a fall as well as chills cough, and mild shortness of breath.  According to patient, for the last 2 days she has had some chills and cough and had a productive cough when I first walked in the room.  She denies any chest pain or palpitations but did admit to some shortness of breath when she is having a coughing fit.  Oxygen saturation dipped about 88% when she was coughing.  She says she has had no nausea, vomiting, constipation, diarrhea, or urinary changes.  She reports she will try to get to the bathroom today when she had a fall and could not get back up.  She was unable to be helped to get up.  She denies any headache or neck pain but does not suspect she hit her head.  She did not lose  consciousness she reports.  She denies any pain in her chest, abdomen, or extremities.  No pain in hips.  On exam, lungs did have coarseness bilaterally.  Chest was nontender abdomen was nontender.  Moving all extremities.  Legs nontender nonedematous.  Patient has symmetric smile.  Clear speech.  Patient did have several coughing fits during my exam and had oxygen saturations again go to about 89%.  No evidence of head trauma.  Given her age and this fall, we will get a CT head.  With the  subjective chills and productive cough we will get a viral swab, x-ray, and labs.  Will continue monitor oxygen.  If she drops again into the 80s or very low 90s with shortness of breath will place her on oxygen.  Anticipate reassessment after workup to determine disposition.        2:18 PM Oxygen saturation dipped into the 80s again and she was found to be COVID-positive.  X-ray did not show pneumonia and CT head did not show acute bleed or injury from fall.  Other labs did not show leukocytosis or anemia and lactic acid was normal.  Creatinine persistently elevated.  Given the positive COVID test and hypoxia without home oxygen requirement, I do feel she needs admission.  Will call for admission for further management.         Final Clinical Impression(s) / ED Diagnoses Final diagnoses:  Fall, initial encounter  Hypoxia  Acute cough  Shortness of breath     Clinical Impression: 1. Fall, initial encounter   2. Hypoxia   3. Acute cough   4. Shortness of breath     Disposition: Admit  This note was prepared with assistance of Dragon voice recognition software. Occasional wrong-word or sound-a-like substitutions may have occurred due to the inherent limitations of voice recognition software.     Amyla Heffner, Gwenyth Allegra, MD 12/05/22 [phone]

## 2022-12-06 ENCOUNTER — Encounter (HOSPITAL_COMMUNITY): Payer: Self-pay | Admitting: Internal Medicine

## 2022-12-06 DIAGNOSIS — W19XXXA Unspecified fall, initial encounter: Secondary | ICD-10-CM | POA: Diagnosis not present

## 2022-12-06 DIAGNOSIS — Y92009 Unspecified place in unspecified non-institutional (private) residence as the place of occurrence of the external cause: Secondary | ICD-10-CM | POA: Diagnosis not present

## 2022-12-06 DIAGNOSIS — J9691 Respiratory failure, unspecified with hypoxia: Secondary | ICD-10-CM | POA: Diagnosis not present

## 2022-12-06 LAB — CBC
HCT: 37.5 % (ref 36.0–46.0)
Hemoglobin: 11.5 g/dL — ABNORMAL LOW (ref 12.0–15.0)
MCH: 25.6 pg — ABNORMAL LOW (ref 26.0–34.0)
MCHC: 30.7 g/dL (ref 30.0–36.0)
MCV: 83.5 fL (ref 80.0–100.0)
Platelets: 123 10*3/uL — ABNORMAL LOW (ref 150–400)
RBC: 4.49 MIL/uL (ref 3.87–5.11)
RDW: 14.1 % (ref 11.5–15.5)
WBC: 8.9 10*3/uL (ref 4.0–10.5)
nRBC: 0 % (ref 0.0–0.2)

## 2022-12-06 LAB — C-REACTIVE PROTEIN: CRP: 5 mg/dL — ABNORMAL HIGH (ref <1.0)

## 2022-12-06 LAB — BASIC METABOLIC PANEL
Anion gap: 8 (ref 5–15)
BUN: 35 mg/dL — ABNORMAL HIGH (ref 8–23)
CO2: 21 mmol/L — ABNORMAL LOW (ref 22–32)
Calcium: 8.4 mg/dL — ABNORMAL LOW (ref 8.9–10.3)
Chloride: 109 mmol/L (ref 98–111)
Creatinine, Ser: 1.69 mg/dL — ABNORMAL HIGH (ref 0.44–1.00)
GFR, Estimated: 27 mL/min — ABNORMAL LOW (ref >60)
Glucose, Bld: 91 mg/dL (ref 70–99)
Potassium: 5 mmol/L (ref 3.5–5.1)
Sodium: 138 mmol/L (ref 135–145)

## 2022-12-06 LAB — D-DIMER, QUANTITATIVE: D-Dimer, Quant: 2.51 ug/mL-FEU — ABNORMAL HIGH (ref 0.00–0.50)

## 2022-12-06 MED ORDER — GABAPENTIN 300 MG PO CAPS
300.0000 mg | ORAL_CAPSULE | Freq: Every day | ORAL | Status: DC
  Administered 2022-12-06 – 2022-12-09 (×4): 300 mg via ORAL
  Filled 2022-12-06 (×4): qty 1

## 2022-12-06 MED ORDER — CYANOCOBALAMIN 500 MCG PO TABS
250.0000 ug | ORAL_TABLET | Freq: Every day | ORAL | Status: DC
  Administered 2022-12-06 – 2022-12-10 (×5): 250 ug via ORAL
  Filled 2022-12-06 (×5): qty 1

## 2022-12-06 MED ORDER — METHYLPREDNISOLONE SODIUM SUCC 40 MG IJ SOLR
40.0000 mg | Freq: Two times a day (BID) | INTRAMUSCULAR | Status: DC
  Administered 2022-12-06 – 2022-12-07 (×3): 40 mg via INTRAVENOUS
  Filled 2022-12-06 (×3): qty 1

## 2022-12-06 MED ORDER — GUAIFENESIN ER 600 MG PO TB12
600.0000 mg | ORAL_TABLET | Freq: Two times a day (BID) | ORAL | Status: DC
  Administered 2022-12-06 – 2022-12-10 (×9): 600 mg via ORAL
  Filled 2022-12-06 (×9): qty 1

## 2022-12-06 MED ORDER — TRAZODONE HCL 50 MG PO TABS
50.0000 mg | ORAL_TABLET | Freq: Every day | ORAL | Status: DC
  Administered 2022-12-06 – 2022-12-09 (×4): 50 mg via ORAL
  Filled 2022-12-06 (×4): qty 1

## 2022-12-06 MED ORDER — TRAZODONE HCL 50 MG PO TABS: 25.0000 mg | ORAL_TABLET | Freq: Every day | ORAL | Status: DC

## 2022-12-06 NOTE — Progress Notes (Signed)
PROGRESS NOTE   Diane Skinner  [id number] DOB: 02-Mar-1923 DOA: 12/05/2022 PCP: Lucianne Lei, MD   Brief Narrative:   74 black female previously from [facility] moved to [place] to live at [facility] 02/2021 Known history of HTN renal disorder reflux B12 deficiency cognitive issues previously on Namenda (discontinued in October 2018)-MoCA was 11/30 in November 2018 Presbycusis  Brought into emergency room after sustaining a fall at [facility], caregiver found her on the ground Per niece who is HCPOA complained of R hip pain Was intermittently hypoxic with COVID being positive Hip x-ray no fracture dislocation, CT head no acute intracranial abnormality moderate generalized atrophy CXR no acute cardiopulmonary  Hospital-Problem based course  COVID-19 infection-relatively asymptomatic - CRP however elevated 5.0, dimer 2.5 - Decision made to start Solu-Medrol 40 twice daily IV - Trend inflammatory markers - CXR my over read does not really show any pneumonia but shows blunting of the costophrenic angles and poor inspiratory effort without any overt pneumonia so we will hold at this time Paxlovid  Fall - CT head negative, no hip fracture - Ambulating well in room  Dementia with prior MoCA testing 11/30 - Orientable to some degree-does have presbycusis which limits her comprehension - Expect that she should make a full recovery and be able to return to [facility] - Resume trazodone but keep at 25 not 50  Mild hyperkalemia in setting of ARB use GFR less than 30 - See below - Check labs in a.m.-no need to address mild hyperkalemia at this time  HTN history - Hold losartan - May use amlodipine 10  B12 deficiency Resume B12 dosing  DVT prophylaxis: heparin Code Status: FULL Family Communication: discussed with Niece Disposition:  Status is: Observation The patient will require care spanning > 2 midnights and should be moved to inpatient  because:   Prior is monitoring given advanced age as well as improvement in kidney function     Subjective: Awake coherent no distress looks comfortable quite hard of hearing No chest pain wheeze rales rhonchi  Objective: Vitals:   12/05/22 1726 12/05/22 2025 12/06/22 0127 12/06/22 0424  BP: (!) 150/77 (!) 148/70 (!) 157/75 128/67  Pulse: (!) 59 60 64 66  Resp: 15 16 16 15   Temp: 98.4 F (36.9 C) 98.2 F (36.8 C) 98.4 F (36.9 C) 98.5 F (36.9 C)  TempSrc: Oral     SpO2: 100% 99% 100% 98%  Weight:      Height:        Intake/Output Summary (Last 24 hours) at 12/06/2022 0753 Last data filed at 12/06/2022 0424 Gross per 24 hour  Intake 627.99 ml  Output 300 ml  Net 327.99 ml   Filed Weights   12/05/22 1017  Weight: 56.7 kg    Examination:  EOMI NCAT looks much younger than stated age no icterus no pallor Chest is clear no wheeze rales rhonchi Abdomen soft no rebound no guarding ROM is intact Neuro is intact  Data Reviewed: personally reviewed   CBC    Component Value Date/Time   WBC 8.9 12/06/2022 0539   RBC 4.49 12/06/2022 0539   HGB 11.5 (L) 12/06/2022 0539   HGB 12.7 03/14/2021 1518   HCT 37.5 12/06/2022 0539   HCT 39.8 03/14/2021 1518   PLT 123 (L) 12/06/2022 0539   PLT 149 (L) 03/14/2021 1518   MCV 83.5 12/06/2022 0539   MCV 82 03/14/2021 1518   MCH 25.6 (L) 12/06/2022 0539   MCHC 30.7 12/06/2022  0539   RDW 14.1 12/06/2022 0539   RDW 14.3 03/14/2021 1518   LYMPHSABS 1.9 12/05/2022 1215   LYMPHSABS 2.1 03/14/2021 1518   MONOABS 0.8 12/05/2022 1215   EOSABS 0.0 12/05/2022 1215   EOSABS 0.0 03/14/2021 1518   BASOSABS 0.0 12/05/2022 1215   BASOSABS 0.0 03/14/2021 1518      Latest Ref Rng & Units 12/06/2022    5:39 AM 12/05/2022    5:58 PM 12/05/2022   12:15 PM  CMP  Glucose 70 - 99 mg/dL 91   92   BUN 8 - 23 mg/dL 35   34   Creatinine 0.44 - 1.00 mg/dL 1.69  1.86  1.96   Sodium 135 - 145 mmol/L 138   138   Potassium 3.5 - 5.1 mmol/L 5.0    4.7   Chloride 98 - 111 mmol/L 109   109   CO2 22 - 32 mmol/L 21   22   Calcium 8.9 - 10.3 mg/dL 8.4   8.8   Total Protein 6.5 - 8.1 g/dL   7.8   Total Bilirubin 0.3 - 1.2 mg/dL   1.0   Alkaline Phos 38 - 126 U/L   60   AST 15 - 41 U/L   18   ALT 0 - 44 U/L   11      Radiology Studies: DG HIP UNILAT WITH PELVIS 2-3 VIEWS RIGHT  Result Date: 12/05/2022 CLINICAL DATA:  [id number] Fall [id number] EXAM: DG HIP (WITH OR WITHOUT PELVIS) 2-3V RIGHT COMPARISON:  CT 06/22/2022 FINDINGS: There is no evidence of hip fracture or dislocation. Diffuse osteopenia. Lower lumbar spondylitic changes. IMPRESSION: 1. No acute findings. 2. Diffuse osteopenia. Electronically Signed   By: Lucrezia Europe M.D.   On: 12/05/2022 16:37   CT HEAD WO CONTRAST (5MM)  Result Date: 12/05/2022 CLINICAL DATA:  Right history: Head trauma, minor. Additional history provided: Unwitnessed fall. EXAM: CT HEAD WITHOUT CONTRAST TECHNIQUE: Contiguous axial images were obtained from the base of the skull through the vertex without intravenous contrast. RADIATION DOSE REDUCTION: This exam was performed according to the departmental dose-optimization program which includes automated exposure control, adjustment of the mA and/or kV according to patient size and/or use of iterative reconstruction technique. COMPARISON:  Head CT 03/15/2021. FINDINGS: Brain: Moderate generalized cerebral atrophy. Patchy and ill-defined hypoattenuation within the cerebral white matter, nonspecific but compatible with moderate chronic small vessel ischemic disease. There is no acute intracranial hemorrhage. No demarcated cortical infarct. No extra-axial fluid collection. No evidence of an intracranial mass. No midline shift. Vascular: Hyperdense vessel.  Atherosclerotic calcifications no Skull: No fracture or aggressive osseous lesion. Sinuses/Orbits: No mass or acute finding within the imaged orbits. Mild mucosal thickening within the left frontal sinus. Mild-to-moderate  mucosal thickening within bilateral ethmoid air cells. Mild mucosal thickening within the bilateral sphenoid sinuses. Mild mucosal thickening within the bilateral maxillary sinuses at the imaged levels. IMPRESSION: 1.  No evidence of an acute intracranial abnormality. 2. Moderate chronic small vessel ischemic changes within the cerebral white matter. 3. Moderate generalized cerebral atrophy. 4. Paranasal sinus disease at the imaged levels, as described. Electronically Signed   By: Kellie Simmering D.O.   On: 12/05/2022 11:34   DG Chest Portable 1 View  Result Date: 12/05/2022 CLINICAL DATA:  Cough, short of EXAM: PORTABLE CHEST 1 VIEW COMPARISON:  02/28/2021 FINDINGS: Prominent cardiac silhouette. Normal pulmonary vasculature. No evidence of effusion, infiltrate, or pneumothorax. No acute bony abnormality. IMPRESSION: No acute cardiopulmonary process. Electronically Signed  By: Suzy Bouchard M.D.   On: 12/05/2022 10:53     Scheduled Meds:  amLODipine  10 mg Oral Daily   docusate sodium  100 mg Oral BID   gabapentin  600 mg Oral QHS   heparin  5,000 Units Subcutaneous Q8H   Continuous Infusions:  sodium chloride 50 mL/hr at 12/05/22 1951     LOS: 0 days   Time spent: Stokes, MD [facility] To contact the attending provider between 7A-7P or the covering provider during after hours 7P-7A, please log into the web site [email or web address] and access using universal [place] password for that web site. If you do not have the password, please call the hospital operator.  12/06/2022, 7:53 AM

## 2022-12-06 NOTE — Evaluation (Signed)
Occupational Therapy Evaluation Patient Details Name: Diane Skinner MRN: [id number] DOB: 1922-11-15 Today's Date: 12/06/2022   History of Present Illness Patient is a 89 y.o. female admitted 12/05/22 after fall at her facility and found to be Covid positive. Past medical history significant for hypertension, arthritis, and previous cholecystectomy   Clinical Impression   Patient is a 89 year old female who was admitted for above. [facility]. Currently, patient needs min guard for mobility in room, max A for LB dressing/bathing, and min A for UB dressing/bathing tasks. Patient was noted to have decreased functional activity tolerance, decreased endurance, decreased standing balance, decreased safety awareness, and decreased knowledge of AD/AE impacting participation in ADLs. Patient would continue to benefit from skilled OT services at this time while admitted and after d/c to address noted deficits in order to improve overall safety and independence in ADLs.       Recommendations for follow up therapy are one component of a multi-disciplinary discharge planning process, led by the attending physician.  Recommendations may be updated based on patient status, additional functional criteria and insurance authorization.   Follow Up Recommendations  Skilled nursing-short term rehab (<3 hours/day)     Assistance Recommended at Discharge Frequent or constant Supervision/Assistance  Patient can return home with the following A little help with walking and/or transfers;A little help with bathing/dressing/bathroom;Direct supervision/assist for financial management;Help with stairs or ramp for entrance;Assist for transportation;Direct supervision/assist for medications management;Assistance with cooking/housework    Functional Status Assessment  Patient has had a recent decline in their functional status and demonstrates the ability to make significant improvements in  function in a reasonable and predictable amount of time.  Equipment Recommendations  None recommended by OT       Precautions / Restrictions Precautions Precautions: Fall Restrictions Weight Bearing Restrictions: No      Mobility Bed Mobility Overal bed mobility: Needs Assistance Bed Mobility: Sit to Supine       Sit to supine: Supervision, HOB elevated             Balance Overall balance assessment: Mild deficits observed, not formally tested           ADL either performed or assessed with clinical judgement   ADL Overall ADL's : Needs assistance/impaired Eating/Feeding: Modified independent;Sitting Eating/Feeding Details (indicate cue type and reason): taking sips from cup on bedside table. Grooming: Set up;Sitting   Upper Body Bathing: Set up;Sitting   Lower Body Bathing: Maximal assistance;Sitting/lateral leans;Sit to/from stand   Upper Body Dressing : Minimal assistance;Sitting   Lower Body Dressing: Maximal assistance;Sitting/lateral leans;Sit to/from stand Lower Body Dressing Details (indicate cue type and reason): patient was unable to complete figure four positioning to don/doff socks. Toilet Transfer: Min guard;Ambulation;Rolling walker (2 wheels) Toilet Transfer Details (indicate cue type and reason): with cues to keep walker with her when patient attempted to leave it behind two steps away from commode and complete clothing management prior to getting to commode. Toileting- Water quality scientist and Hygiene: Min guard;Sit to/from stand               Vision   Vision Assessment?: No apparent visual deficits Additional Comments: difficult to formally assess with patients [facility]            Pertinent Vitals/Pain Pain Assessment Pain Assessment: No/denies pain     Hand Dominance     Extremity/Trunk Assessment Upper Extremity Assessment Upper Extremity Assessment: Overall WFL for tasks assessed   Lower Extremity Assessment  Lower Extremity  Assessment: Defer to PT evaluation   Cervical / Trunk Assessment Cervical / Trunk Assessment: Normal   Communication Communication Communication: HOH   Cognition Arousal/Alertness: Awake/alert Behavior During Therapy: Flat affect Overall Cognitive Status: No family/caregiver present to determine baseline cognitive functioning             General Comments: pt very HOH, appropriate responses, follows simple commands                Home Living Family/patient expects to be discharged to:: Assisted living         Home Equipment: Conservation officer, nature (2 wheels)   Additional Comments: From [facility] (?Independent vs Assisted Living)      Prior Functioning/Environment Prior Level of Function : Needs assist;Patient poor historian/Family not available             Mobility Comments: pt reports staff present for her mobility however admitted with unwitnessed fall in her bathroom ADLs Comments: reports being able to do things with staff        OT Problem List: Decreased activity tolerance;Impaired balance (sitting and/or standing);Decreased coordination;Decreased knowledge of precautions;Decreased safety awareness;Decreased knowledge of use of DME or AE      OT Treatment/Interventions: Self-care/ADL training;Energy conservation;Therapeutic exercise;DME and/or AE instruction;Therapeutic activities;Patient/family education;Balance training    OT Goals(Current goals can be found in the care plan section) Acute Rehab OT Goals Patient Stated Goal: none stated OT Goal Formulation: Patient unable to participate in goal setting Time For Goal Achievement: 12/20/22 Potential to Achieve Goals: Fair  OT Frequency: Min 2X/week       AM-PAC OT "6 Clicks" Daily Activity     Outcome Measure Help from another person eating meals?: None Help from another person taking care of personal grooming?: A Little Help from another person toileting, which includes using toliet, bedpan, or  urinal?: A Little Help from another person bathing (including washing, rinsing, drying)?: A Little Help from another person to put on and taking off regular upper body clothing?: A Little Help from another person to put on and taking off regular lower body clothing?: A Lot 6 Click Score: 18   End of Session Equipment Utilized During Treatment: Gait belt;Rolling walker (2 wheels) Nurse Communication: Mobility status  Activity Tolerance: Patient tolerated treatment well Patient left: in bed;with call bell/phone within reach;with bed alarm set  OT Visit Diagnosis: Unsteadiness on feet (R26.81);Other abnormalities of gait and mobility (R26.89);Muscle weakness (generalized) (M62.81)                Time: [id number] OT Time Calculation (min): 14 min Charges:  OT General Charges $OT Visit: 1 Visit OT Evaluation $OT Eval Moderate Complexity: 1 Mod  Skyelyn Scruggs OTR/L, MS Acute Rehabilitation Department Office# [phone]   Willa Rough 12/06/2022, 4:38 PM

## 2022-12-06 NOTE — Evaluation (Signed)
Physical Therapy Evaluation Patient Details Name: Diane Skinner MRN: [id number] DOB: August 07, 1923 Today's Date: 12/06/2022  History of Present Illness  89 y.o. female admitted 12/05/22 after fall at her facility and found to be Covid positive. Past medical history significant for hypertension, arthritis, and previous cholecystectomy  Clinical Impression  Pt admitted with above diagnosis.  Pt currently with functional limitations due to the deficits listed below (see PT Problem List). Pt will benefit from skilled PT to increase their independence and safety with mobility to allow discharge to the venue listed below.  Pt very HOH but reports she lives in a facility and has assist for mobility and ADLs.  Pt assisted with ambulating in room and then left to comfort sitting in recliner.  Recommend SNF if facility is unable to provide current level of assist.        Recommendations for follow up therapy are one component of a multi-disciplinary discharge planning process, led by the attending physician.  Recommendations may be updated based on patient status, additional functional criteria and insurance authorization.  Follow Up Recommendations Skilled nursing-short term rehab (<3 hours/day) Can patient physically be transported by private vehicle: Yes    Assistance Recommended at Discharge Intermittent Supervision/Assistance  Patient can return home with the following  A little help with walking and/or transfers;A little help with bathing/dressing/bathroom;Help with stairs or ramp for entrance;Assistance with cooking/housework;Assist for transportation    Equipment Recommendations None recommended by PT  Recommendations for Other Services       Functional Status Assessment Patient has had a recent decline in their functional status and demonstrates the ability to make significant improvements in function in a reasonable and predictable amount of time.     Precautions / Restrictions  Precautions Precautions: Fall      Mobility  Bed Mobility Overal bed mobility: Needs Assistance Bed Mobility: Supine to Sit     Supine to sit: Supervision, HOB elevated          Transfers Overall transfer level: Needs assistance Equipment used: Rolling walker (2 wheels) Transfers: Sit to/from Stand Sit to Stand: Min assist           General transfer comment: light assist to rise and steady, assist to lower into recliner due to uncontrolled descent while turning    Ambulation/Gait Ambulation/Gait assistance: Min guard Gait Distance (Feet): 20 Feet Assistive device: Rolling walker (2 wheels) Gait Pattern/deviations: Step-through pattern, Decreased stride length, Trunk flexed Gait velocity: decr     General Gait Details: small short steps, distance per tolerance, SPO2 93% on room air and HR 60 bpm upon sitting in recliner  Stairs            Wheelchair Mobility    Modified Rankin (Stroke Patients Only)       Balance                                             Pertinent Vitals/Pain Pain Assessment Pain Assessment: No/denies pain    Home Living Family/patient expects to be discharged to:: Assisted living                 Home Equipment: Conservation officer, nature (2 wheels) Additional Comments: From [employer] [facility] (?Independent vs Assisted Living)    Prior Function Prior Level of Function : Needs assist;Patient poor historian/Family not available  Mobility Comments: pt reports staff present for her mobility however admitted with unwitnessed fall in her bathroom ADLs Comments: reports assist     Hand Dominance        Extremity/Trunk Assessment        Lower Extremity Assessment Lower Extremity Assessment: Generalized weakness       Communication   Communication: HOH  Cognition Arousal/Alertness: Awake/alert Behavior During Therapy: Flat affect Overall Cognitive Status: No family/caregiver present to  determine baseline cognitive functioning                                 General Comments: pt very HOH, appropriate responses, follows simple commands        General Comments      Exercises     Assessment/Plan    PT Assessment Patient needs continued PT services  PT Problem List Decreased mobility;Decreased balance;Decreased strength;Decreased activity tolerance;Decreased knowledge of use of DME       PT Treatment Interventions Gait training;DME instruction;Therapeutic exercise;Functional mobility training;Therapeutic activities;Patient/family education;Balance training    PT Goals (Current goals can be found in the Care Plan section)  Acute Rehab PT Goals PT Goal Formulation: Patient unable to participate in goal setting Time For Goal Achievement: 12/20/22 Potential to Achieve Goals: Good    Frequency Min 2X/week     Co-evaluation               AM-PAC PT "6 Clicks" Mobility  Outcome Measure Help needed turning from your back to your side while in a flat bed without using bedrails?: A Little Help needed moving from lying on your back to sitting on the side of a flat bed without using bedrails?: A Little Help needed moving to and from a bed to a chair (including a wheelchair)?: A Little Help needed standing up from a chair using your arms (e.g., wheelchair or bedside chair)?: A Lot Help needed to walk in hospital room?: A Lot Help needed climbing 3-5 steps with a railing? : A Lot 6 Click Score: 15    End of Session Equipment Utilized During Treatment: Gait belt Activity Tolerance: Patient tolerated treatment well Patient left: in chair;with call bell/phone within reach;with chair alarm set   PT Visit Diagnosis: Difficulty in walking, not elsewhere classified (R26.2);Muscle weakness (generalized) (M62.81)    Time: 1431-1450 PT Time Calculation (min) (ACUTE ONLY): 19 min   Charges:   PT Evaluation $PT Eval Low Complexity: 1 Low        Kati  PT, DPT Physical Therapist Acute Rehabilitation Services Preferred contact method: Secure Chat Weekend Pager Only: [phone] Office: [place] 12/06/2022, 3:00 PM

## 2022-12-07 ENCOUNTER — Encounter (HOSPITAL_COMMUNITY): Payer: Self-pay | Admitting: Internal Medicine

## 2022-12-07 DIAGNOSIS — Y92009 Unspecified place in unspecified non-institutional (private) residence as the place of occurrence of the external cause: Secondary | ICD-10-CM | POA: Diagnosis not present

## 2022-12-07 DIAGNOSIS — W19XXXA Unspecified fall, initial encounter: Secondary | ICD-10-CM | POA: Diagnosis not present

## 2022-12-07 DIAGNOSIS — J9691 Respiratory failure, unspecified with hypoxia: Secondary | ICD-10-CM | POA: Diagnosis not present

## 2022-12-07 LAB — BASIC METABOLIC PANEL
Anion gap: 8 (ref 5–15)
BUN: 40 mg/dL — ABNORMAL HIGH (ref 8–23)
CO2: 19 mmol/L — ABNORMAL LOW (ref 22–32)
Calcium: 8.6 mg/dL — ABNORMAL LOW (ref 8.9–10.3)
Chloride: 108 mmol/L (ref 98–111)
Creatinine, Ser: 1.98 mg/dL — ABNORMAL HIGH (ref 0.44–1.00)
GFR, Estimated: 23 mL/min — ABNORMAL LOW (ref >60)
Glucose, Bld: 130 mg/dL — ABNORMAL HIGH (ref 70–99)
Potassium: 5.6 mmol/L — ABNORMAL HIGH (ref 3.5–5.1)
Sodium: 135 mmol/L (ref 135–145)

## 2022-12-07 LAB — C-REACTIVE PROTEIN: CRP: 5.6 mg/dL — ABNORMAL HIGH (ref <1.0)

## 2022-12-07 LAB — CBC
HCT: 37.7 % (ref 36.0–46.0)
Hemoglobin: 11.5 g/dL — ABNORMAL LOW (ref 12.0–15.0)
MCH: 25.2 pg — ABNORMAL LOW (ref 26.0–34.0)
MCHC: 30.5 g/dL (ref 30.0–36.0)
MCV: 82.5 fL (ref 80.0–100.0)
Platelets: 134 10*3/uL — ABNORMAL LOW (ref 150–400)
RBC: 4.57 MIL/uL (ref 3.87–5.11)
RDW: 14 % (ref 11.5–15.5)
WBC: 7.1 10*3/uL (ref 4.0–10.5)
nRBC: 0 % (ref 0.0–0.2)

## 2022-12-07 LAB — D-DIMER, QUANTITATIVE: D-Dimer, Quant: 2.05 ug/mL-FEU — ABNORMAL HIGH (ref 0.00–0.50)

## 2022-12-07 MED ORDER — SODIUM CHLORIDE 0.9 % IV SOLN: INTRAVENOUS | Status: DC

## 2022-12-07 MED ORDER — PREDNISONE 20 MG PO TABS
40.0000 mg | ORAL_TABLET | Freq: Every day | ORAL | Status: DC
  Administered 2022-12-08 – 2022-12-10 (×3): 40 mg via ORAL
  Filled 2022-12-07 (×3): qty 2

## 2022-12-07 NOTE — Plan of Care (Signed)

## 2022-12-07 NOTE — Progress Notes (Addendum)
PROGRESS NOTE   Diane Skinner  [id number] DOB: Dec 03, 1922 DOA: 12/05/2022 PCP: Lucianne Lei, MD   Brief Narrative:   63 black female previously from [facility] moved to [place] to live at [facility] 02/2021 Known history of HTN renal disorder reflux B12 deficiency cognitive issues previously on Namenda (discontinued in October 2018)-MoCA was 11/30 in November 2018 Presbycusis  Brought into emergency room after sustaining a fall at [facility], caregiver found her on the ground Per niece who is HCPOA complained of R hip pain Was intermittently hypoxic with COVID being positive Hip x-ray no fracture dislocation, CT head no acute intracranial abnormality moderate generalized atrophy CXR no acute cardiopulmonary  Hospital-Problem based course  COVID-19 infection-relatively asymptomatic - CRP 5.6, dimer 2.5->2.0 - cont Solu-Medrol 40 twice daily IV - CXR 3.15 clear--holding paxlovid at this time as no oxygen requirement and seems quite comfortable  Fall - CT head negative, no hip fracture - Ambulating well in room  Dementia with prior MoCA testing 11/30 - Orientable-monitor for confusion although she seems quite clear - Resume trazodone 50  Mild hyperkalemia in setting of ARB use AKI superimposed on CKD 3B GFR less than 30 - Get EKG-temporizing measures if there are EKG changes - Start saline 75 cc/H and recheck potassium as well as rest of labs in a.m.  HTN history-would not treat a blood pressure unless it is due to >150 - Hold losartan - May use amlodipine 10  B12 deficiency Resume B12 dosing  DVT prophylaxis: heparin Code Status: FULL Family Communication: discussed with Niece on 3/16 Disposition:  Status is: Inpatient The patient will require care spanning > 2 midnights and should be moved to inpatient because:   Still on IV steroids CRP elevated-de-escalate based on COVID labs oxygen status and likely can discharge home in a.m.      Subjective:  Awake coherent no distress EOMI NCAT She is not on oxygen at all She has no chest pain She is not coughing  Objective: Vitals:   12/06/22 1522 12/06/22 1551 12/06/22 2147 12/07/22 0451  BP:  139/76 (!) 154/80 138/78  Pulse: (!) 59 (!) 59 67 (!) 53  Resp:  16 (!) 24 20  Temp:  97.7 F (36.5 C) 98.1 F (36.7 C) 98.2 F (36.8 C)  TempSrc:  Oral Oral Oral  SpO2: 94% 94% 94% 95%  Weight:      Height:        Intake/Output Summary (Last 24 hours) at 12/07/2022 0756 Last data filed at 12/07/2022 0500 Gross per 24 hour  Intake 1450 ml  Output 700 ml  Net 750 ml    Filed Weights   12/05/22 1017  Weight: 56.7 kg    Examination:  EOMI NCAT no focal deficit looks much younger than stated age Chest is clear no wheeze rales rhonchi Abdomen is soft ROM is intact No lower extremity edema  Data Reviewed: personally reviewed   CBC    Component Value Date/Time   WBC 7.1 12/07/2022 0518   RBC 4.57 12/07/2022 0518   HGB 11.5 (L) 12/07/2022 0518   HGB 12.7 03/14/2021 1518   HCT 37.7 12/07/2022 0518   HCT 39.8 03/14/2021 1518   PLT 134 (L) 12/07/2022 0518   PLT 149 (L) 03/14/2021 1518   MCV 82.5 12/07/2022 0518   MCV 82 03/14/2021 1518   MCH 25.2 (L) 12/07/2022 0518   MCHC 30.5 12/07/2022 0518   RDW 14.0 12/07/2022 0518   RDW 14.3 03/14/2021 1518  LYMPHSABS 1.9 12/05/2022 1215   LYMPHSABS 2.1 03/14/2021 1518   MONOABS 0.8 12/05/2022 1215   EOSABS 0.0 12/05/2022 1215   EOSABS 0.0 03/14/2021 1518   BASOSABS 0.0 12/05/2022 1215   BASOSABS 0.0 03/14/2021 1518      Latest Ref Rng & Units 12/06/2022    5:39 AM 12/05/2022    5:58 PM 12/05/2022   12:15 PM  CMP  Glucose 70 - 99 mg/dL 91   92   BUN 8 - 23 mg/dL 35   34   Creatinine 0.44 - 1.00 mg/dL 1.69  1.86  1.96   Sodium 135 - 145 mmol/L 138   138   Potassium 3.5 - 5.1 mmol/L 5.0   4.7   Chloride 98 - 111 mmol/L 109   109   CO2 22 - 32 mmol/L 21   22   Calcium 8.9 - 10.3 mg/dL 8.4   8.8   Total  Protein 6.5 - 8.1 g/dL   7.8   Total Bilirubin 0.3 - 1.2 mg/dL   1.0   Alkaline Phos 38 - 126 U/L   60   AST 15 - 41 U/L   18   ALT 0 - 44 U/L   11      Radiology Studies: DG HIP UNILAT WITH PELVIS 2-3 VIEWS RIGHT  Result Date: 12/05/2022 CLINICAL DATA:  [id number] Fall [id number] EXAM: DG HIP (WITH OR WITHOUT PELVIS) 2-3V RIGHT COMPARISON:  CT 06/22/2022 FINDINGS: There is no evidence of hip fracture or dislocation. Diffuse osteopenia. Lower lumbar spondylitic changes. IMPRESSION: 1. No acute findings. 2. Diffuse osteopenia. Electronically Signed   By: Lucrezia Europe M.D.   On: 12/05/2022 16:37   CT HEAD WO CONTRAST (5MM)  Result Date: 12/05/2022 CLINICAL DATA:  Right history: Head trauma, minor. Additional history provided: Unwitnessed fall. EXAM: CT HEAD WITHOUT CONTRAST TECHNIQUE: Contiguous axial images were obtained from the base of the skull through the vertex without intravenous contrast. RADIATION DOSE REDUCTION: This exam was performed according to the departmental dose-optimization program which includes automated exposure control, adjustment of the mA and/or kV according to patient size and/or use of iterative reconstruction technique. COMPARISON:  Head CT 03/15/2021. FINDINGS: Brain: Moderate generalized cerebral atrophy. Patchy and ill-defined hypoattenuation within the cerebral white matter, nonspecific but compatible with moderate chronic small vessel ischemic disease. There is no acute intracranial hemorrhage. No demarcated cortical infarct. No extra-axial fluid collection. No evidence of an intracranial mass. No midline shift. Vascular: Hyperdense vessel.  Atherosclerotic calcifications no Skull: No fracture or aggressive osseous lesion. Sinuses/Orbits: No mass or acute finding within the imaged orbits. Mild mucosal thickening within the left frontal sinus. Mild-to-moderate mucosal thickening within bilateral ethmoid air cells. Mild mucosal thickening within the bilateral sphenoid sinuses. Mild  mucosal thickening within the bilateral maxillary sinuses at the imaged levels. IMPRESSION: 1.  No evidence of an acute intracranial abnormality. 2. Moderate chronic small vessel ischemic changes within the cerebral white matter. 3. Moderate generalized cerebral atrophy. 4. Paranasal sinus disease at the imaged levels, as described. Electronically Signed   By: Kellie Simmering D.O.   On: 12/05/2022 11:34   DG Chest Portable 1 View  Result Date: 12/05/2022 CLINICAL DATA:  Cough, short of EXAM: PORTABLE CHEST 1 VIEW COMPARISON:  02/28/2021 FINDINGS: Prominent cardiac silhouette. Normal pulmonary vasculature. No evidence of effusion, infiltrate, or pneumothorax. No acute bony abnormality. IMPRESSION: No acute cardiopulmonary process. Electronically Signed   By: Suzy Bouchard M.D.   On: 12/05/2022 10:53     Scheduled  Meds:  amLODipine  10 mg Oral Daily   cyanocobalamin  250 mcg Oral Daily   docusate sodium  100 mg Oral BID   gabapentin  300 mg Oral QHS   guaiFENesin  600 mg Oral BID   heparin  5,000 Units Subcutaneous Q8H   methylPREDNISolone (SOLU-MEDROL) injection  40 mg Intravenous Q12H   traZODone  50 mg Oral QHS   Continuous Infusions:     LOS: 0 days   Time spent: [place], MD [facility] To contact the attending provider between 7A-7P or the covering provider during after hours 7P-7A, please log into the web site [email or web address] and access using universal [place] password for that web site. If you do not have the password, please call the hospital operator.  12/07/2022, 7:56 AM

## 2022-12-08 DIAGNOSIS — L8990 Pressure ulcer of unspecified site, unspecified stage: Secondary | ICD-10-CM | POA: Insufficient documentation

## 2022-12-08 DIAGNOSIS — F0390 Unspecified dementia without behavioral disturbance: Secondary | ICD-10-CM | POA: Diagnosis present

## 2022-12-08 DIAGNOSIS — W19XXXA Unspecified fall, initial encounter: Secondary | ICD-10-CM | POA: Diagnosis not present

## 2022-12-08 DIAGNOSIS — I129 Hypertensive chronic kidney disease with stage 1 through stage 4 chronic kidney disease, or unspecified chronic kidney disease: Secondary | ICD-10-CM | POA: Diagnosis present

## 2022-12-08 DIAGNOSIS — G629 Polyneuropathy, unspecified: Secondary | ICD-10-CM | POA: Diagnosis present

## 2022-12-08 DIAGNOSIS — E538 Deficiency of other specified B group vitamins: Secondary | ICD-10-CM | POA: Diagnosis present

## 2022-12-08 DIAGNOSIS — J9601 Acute respiratory failure with hypoxia: Secondary | ICD-10-CM | POA: Diagnosis present

## 2022-12-08 DIAGNOSIS — N1832 Chronic kidney disease, stage 3b: Secondary | ICD-10-CM | POA: Diagnosis present

## 2022-12-08 DIAGNOSIS — M25551 Pain in right hip: Secondary | ICD-10-CM | POA: Diagnosis present

## 2022-12-08 DIAGNOSIS — Z6820 Body mass index (BMI) 20.0-20.9, adult: Secondary | ICD-10-CM | POA: Diagnosis not present

## 2022-12-08 DIAGNOSIS — R059 Cough, unspecified: Secondary | ICD-10-CM | POA: Diagnosis present

## 2022-12-08 DIAGNOSIS — N179 Acute kidney failure, unspecified: Secondary | ICD-10-CM | POA: Diagnosis present

## 2022-12-08 DIAGNOSIS — U071 COVID-19: Secondary | ICD-10-CM | POA: Diagnosis present

## 2022-12-08 DIAGNOSIS — M1990 Unspecified osteoarthritis, unspecified site: Secondary | ICD-10-CM | POA: Diagnosis present

## 2022-12-08 DIAGNOSIS — E440 Moderate protein-calorie malnutrition: Secondary | ICD-10-CM | POA: Diagnosis present

## 2022-12-08 DIAGNOSIS — D696 Thrombocytopenia, unspecified: Secondary | ICD-10-CM | POA: Diagnosis present

## 2022-12-08 DIAGNOSIS — L89151 Pressure ulcer of sacral region, stage 1: Secondary | ICD-10-CM | POA: Diagnosis present

## 2022-12-08 DIAGNOSIS — Z9049 Acquired absence of other specified parts of digestive tract: Secondary | ICD-10-CM | POA: Diagnosis not present

## 2022-12-08 DIAGNOSIS — H9190 Unspecified hearing loss, unspecified ear: Secondary | ICD-10-CM | POA: Diagnosis present

## 2022-12-08 DIAGNOSIS — K219 Gastro-esophageal reflux disease without esophagitis: Secondary | ICD-10-CM | POA: Diagnosis present

## 2022-12-08 DIAGNOSIS — Z8249 Family history of ischemic heart disease and other diseases of the circulatory system: Secondary | ICD-10-CM | POA: Diagnosis not present

## 2022-12-08 DIAGNOSIS — E875 Hyperkalemia: Secondary | ICD-10-CM | POA: Diagnosis present

## 2022-12-08 DIAGNOSIS — R338 Other retention of urine: Secondary | ICD-10-CM | POA: Diagnosis present

## 2022-12-08 DIAGNOSIS — R001 Bradycardia, unspecified: Secondary | ICD-10-CM | POA: Diagnosis not present

## 2022-12-08 DIAGNOSIS — Y92009 Unspecified place in unspecified non-institutional (private) residence as the place of occurrence of the external cause: Secondary | ICD-10-CM | POA: Diagnosis not present

## 2022-12-08 DIAGNOSIS — Z79899 Other long term (current) drug therapy: Secondary | ICD-10-CM | POA: Diagnosis not present

## 2022-12-08 DIAGNOSIS — R0602 Shortness of breath: Secondary | ICD-10-CM | POA: Diagnosis present

## 2022-12-08 DIAGNOSIS — J9691 Respiratory failure, unspecified with hypoxia: Secondary | ICD-10-CM | POA: Diagnosis not present

## 2022-12-08 DIAGNOSIS — H9110 Presbycusis, unspecified ear: Secondary | ICD-10-CM | POA: Diagnosis present

## 2022-12-08 LAB — COMPREHENSIVE METABOLIC PANEL
ALT: 9 U/L (ref 0–44)
AST: 15 U/L (ref 15–41)
Albumin: 3 g/dL — ABNORMAL LOW (ref 3.5–5.0)
Alkaline Phosphatase: 44 U/L (ref 38–126)
Anion gap: 7 (ref 5–15)
BUN: 44 mg/dL — ABNORMAL HIGH (ref 8–23)
CO2: 22 mmol/L (ref 22–32)
Calcium: 8.3 mg/dL — ABNORMAL LOW (ref 8.9–10.3)
Chloride: 107 mmol/L (ref 98–111)
Creatinine, Ser: 1.93 mg/dL — ABNORMAL HIGH (ref 0.44–1.00)
GFR, Estimated: 23 mL/min — ABNORMAL LOW (ref >60)
Glucose, Bld: 96 mg/dL (ref 70–99)
Potassium: 5.5 mmol/L — ABNORMAL HIGH (ref 3.5–5.1)
Sodium: 136 mmol/L (ref 135–145)
Total Bilirubin: 0.8 mg/dL (ref 0.3–1.2)
Total Protein: 6.6 g/dL (ref 6.5–8.1)

## 2022-12-08 LAB — CBC
HCT: 33.4 % — ABNORMAL LOW (ref 36.0–46.0)
Hemoglobin: 10.5 g/dL — ABNORMAL LOW (ref 12.0–15.0)
MCH: 25.4 pg — ABNORMAL LOW (ref 26.0–34.0)
MCHC: 31.4 g/dL (ref 30.0–36.0)
MCV: 80.9 fL (ref 80.0–100.0)
Platelets: 148 10*3/uL — ABNORMAL LOW (ref 150–400)
RBC: 4.13 MIL/uL (ref 3.87–5.11)
RDW: 13.8 % (ref 11.5–15.5)
WBC: 9.7 10*3/uL (ref 4.0–10.5)
nRBC: 0 % (ref 0.0–0.2)

## 2022-12-08 LAB — D-DIMER, QUANTITATIVE: D-Dimer, Quant: 1.23 ug/mL-FEU — ABNORMAL HIGH (ref 0.00–0.50)

## 2022-12-08 LAB — C-REACTIVE PROTEIN: CRP: 2.3 mg/dL — ABNORMAL HIGH (ref <1.0)

## 2022-12-08 MED ORDER — SODIUM ZIRCONIUM CYCLOSILICATE 5 G PO PACK
5.0000 g | PACK | Freq: Two times a day (BID) | ORAL | Status: DC
  Administered 2022-12-08 (×2): 5 g via ORAL
  Filled 2022-12-08 (×2): qty 1

## 2022-12-08 NOTE — Plan of Care (Signed)

## 2022-12-08 NOTE — TOC Initial Note (Signed)
Transition of Care [facility]) - Initial/Assessment Note    Patient Details  Name: Diane Skinner MRN: [id number] Date of Birth: September 03, 1923  Transition [facility]) CM/SW Contact:    Bethann Berkshire, [place] Phone Number: 12/08/2022, 9:19 AM  Clinical Narrative:                  CSW called pt's niece, Virgilio Belling, to discuss disposition. Pt rec is for SNF unless she has necessary support at her facility. Intermittent Supervision/Assistance is recommended at DC. Niece states that pt lives at [place]. She has a caregiver that comes in the mornings and another care giver that comes and assists pt's in afternoons. Pt also has another Niece who is local and available to assist. [facility] has [facility] PT/OT onsite that can be arrange. Pt also has a walker, rollator, and cane at home. Virgilio Belling feels comfortable with this plan. CSW will notify [facility] of PT/OT needs.    Expected Discharge Plan: [place] Barriers to Discharge: Continued Medical Work up   Patient Goals and CMS Choice            Expected Discharge Plan and Services       Living arrangements for the past 2 months: [place]                                      Prior Living Arrangements/Services Living arrangements for the past 2 months: [place] Lives with:: Self Patient language and need for interpreter reviewed:: Yes        Need for Family Participation in Patient Care: Yes (Comment) Care giver support system in place?: Yes (comment) Current home services: Homehealth aide Criminal Activity/Legal Involvement Pertinent to Current Situation/Hospitalization: No - Comment as needed  Activities of Daily Living Home Assistive Devices/Equipment: Cane (specify quad or straight), Walker (specify type) ADL Screening (condition at time of admission) Patient's cognitive ability adequate to safely complete daily activities?: Yes Is the patient deaf or have  difficulty hearing?: Yes Does the patient have difficulty seeing, even when wearing glasses/contacts?: Yes Does the patient have difficulty concentrating, remembering, or making decisions?: Yes Patient able to express need for assistance with ADLs?: Yes Does the patient have difficulty dressing or bathing?: Yes Independently performs ADLs?: No Communication: Independent Dressing (OT): Needs assistance Is this a change from baseline?: Pre-admission baseline Grooming: Needs assistance Is this a change from baseline?: Pre-admission baseline Feeding: Independent Is this a change from baseline?: Pre-admission baseline Bathing: Needs assistance Is this a change from baseline?: Pre-admission baseline Toileting: Needs assistance Is this a change from baseline?: Pre-admission baseline In/Out Bed: Needs assistance Is this a change from baseline?: Pre-admission baseline Walks in Home: Needs assistance Is this a change from baseline?: Pre-admission baseline Does the patient have difficulty walking or climbing stairs?: Yes Weakness of Legs: Both Weakness of Arms/Hands: Both  Permission Sought/Granted                  Emotional Assessment       Orientation: : Oriented to Place, Oriented to Self      Admission diagnosis:  Shortness of breath [R06.02] Hypoxia [R09.02] Fall, initial encounter [W19.XXXA] Acute cough [R05.1] COVID-19 [U07.1] Patient Active Problem List   Diagnosis Date Noted   [id number] 12/05/2022   Hip pain, acute, right 12/05/2022   Respiratory failure with hypoxia ([place]) 12/05/2022   Fall  at home, initial encounter 12/05/2022   Peripheral neuropathy 12/05/2022   Sacral wound, initial encounter 04/17/2021   Healthcare maintenance 04/17/2021   Chronic sinus bradycardia 04/17/2021   Thrombocytopenia ([place]) 03/15/2021   Renal disorder    Hypertension    PCP:  Lucianne Lei, MD Pharmacy:   [id number] - [place], [address] [place] [address] Phone: [phone] Fax: [phone]     Social Determinants of Health (SDOH) Social History: SDOH Screenings   Food Insecurity: No Food Insecurity (12/05/2022)  Housing: Low Risk  (12/05/2022)  Transportation Needs: No Transportation Needs (12/05/2022)  Utilities: Not At Risk (12/05/2022)  Tobacco Use: Low Risk  (12/07/2022)   SDOH Interventions:     Readmission Risk Interventions     No data to display

## 2022-12-08 NOTE — Progress Notes (Addendum)
PROGRESS NOTE   Diane Skinner  [id number] DOB: 1922-09-24 DOA: 12/05/2022 PCP: Lucianne Lei, MD   Brief Narrative:   40 black female previously from [facility] moved to [place] to live at [facility] 02/2021 Pmh HTN renal disorder reflux B12 deficiency cognitive issues prior Namenda (discontinued in October 2018)-MoCA was 11/30 in November 2018 Presbycusis  Brought into emergency room 3/15  after sustaining a fall at [facility], caregiver found her on the ground Per niece who is HCPOA complained of R hip pain Was intermittently hypoxic--COVID - positive Hip x-ray no fracture dislocation, CT head no acute intracranial abnormality moderate generalized atrophy CXR no acute cardiopulmonary  Hospitalization complicated by elevated creatinine-postvoid residuals >150  Hospital-Problem based course  COVID-19 infection-relatively asymptomatic - Inflammatory markers have diminished and has no oxygen requirement - Transition Solu-Medrol to prednisone 40-does not require Paxlovid - CXR 3.15 clear  Fall - CT head negative, no hip fracture - Ambulating well in room  Dementia with prior MoCA testing 11/30 - Orientable-monitor for confusion although she seems quite clear - Resume trazodone 50  Mild hyperkalemia in setting of ARB use Acute urinary retention secondary to hospitalization AKI superimposed on CKD 3B GFR less than 30 - EKG did not show any ST-T wave changes on 3/17 - has postvoid residuals>150 so we will In-N-Out cath and repeat labs in the morning-if patient requires >2 in and out catheterizations may benefit from indwelling Foley catheter short-term and outpatient evaluation by urology  HTN history-would not treat a blood pressure unless it is due to >150 - Hold losartan - May use amlodipine 10  B12 deficiency Resume B12 dosing  DVT prophylaxis: heparin Code Status: FULL Family Communication: discussed with the niece on the phone and  explained to her that patient has a mildly elevated potassium and has worsening azotemia, may require indwelling Foley versus not--- explained scenario regarding outpatient follow-up if this is the case with a urologist Disposition:  Status is: Inpatient The patient will require care spanning > 2 midnights and should be moved to inpatient because:   Potassium is elevated, so his creatinine and patient will need decision regarding Foley catheter versus not tomorrow and then can discharge home   Subjective:  Awake coherent no distress EOMI NCAT She is not on oxygen at all She has no chest pain She is not coughing  Objective: Vitals:   12/07/22 1130 12/07/22 1530 12/07/22 2022 12/08/22 0539  BP: 123/67 119/73 (!) 143/69 138/69  Pulse: (!) 56 62 (!) 59 (!) 47  Resp: 18  20 18   Temp: (!) 97.3 F (36.3 C) (!) 97.5 F (36.4 C) 98.4 F (36.9 C) 98.2 F (36.8 C)  TempSrc: Oral Oral Oral Oral  SpO2: 99% 100% 97% 96%  Weight:      Height:        Intake/Output Summary (Last 24 hours) at 12/08/2022 1254 Last data filed at 12/08/2022 1015 Gross per 24 hour  Intake 720 ml  Output 1250 ml  Net -530 ml    Filed Weights   12/05/22 1017  Weight: 56.7 kg    Examination:  EOMI NCAT no focal deficit looks younger than stated age Chest is clear no wheeze rales rhonchi Abdomen is soft ROM is intact No lower extremity edema  Data Reviewed: personally reviewed   CBC    Component Value Date/Time   WBC 9.7 12/08/2022 0533   RBC 4.13 12/08/2022 0533   HGB 10.5 (L) 12/08/2022 0533   HGB 12.7 03/14/2021  1518   HCT 33.4 (L) 12/08/2022 0533   HCT 39.8 03/14/2021 1518   PLT 148 (L) 12/08/2022 0533   PLT 149 (L) 03/14/2021 1518   MCV 80.9 12/08/2022 0533   MCV 82 03/14/2021 1518   MCH 25.4 (L) 12/08/2022 0533   MCHC 31.4 12/08/2022 0533   RDW 13.8 12/08/2022 0533   RDW 14.3 03/14/2021 1518   LYMPHSABS 1.9 12/05/2022 1215   LYMPHSABS 2.1 03/14/2021 1518   MONOABS 0.8 12/05/2022  1215   EOSABS 0.0 12/05/2022 1215   EOSABS 0.0 03/14/2021 1518   BASOSABS 0.0 12/05/2022 1215   BASOSABS 0.0 03/14/2021 1518      Latest Ref Rng & Units 12/08/2022    5:33 AM 12/07/2022    5:18 AM 12/06/2022    5:39 AM  CMP  Glucose 70 - 99 mg/dL 96  130  91   BUN 8 - 23 mg/dL 44  40  35   Creatinine 0.44 - 1.00 mg/dL 1.93  1.98  1.69   Sodium 135 - 145 mmol/L 136  135  138   Potassium 3.5 - 5.1 mmol/L 5.5  5.6  5.0   Chloride 98 - 111 mmol/L 107  108  109   CO2 22 - 32 mmol/L 22  19  21    Calcium 8.9 - 10.3 mg/dL 8.3  8.6  8.4   Total Protein 6.5 - 8.1 g/dL 6.6     Total Bilirubin 0.3 - 1.2 mg/dL 0.8     Alkaline Phos 38 - 126 U/L 44     AST 15 - 41 U/L 15     ALT 0 - 44 U/L 9      Radiology Studies: No results found.  Scheduled Meds:  amLODipine  10 mg Oral Daily   cyanocobalamin  250 mcg Oral Daily   docusate sodium  100 mg Oral BID   gabapentin  300 mg Oral QHS   guaiFENesin  600 mg Oral BID   heparin  5,000 Units Subcutaneous Q8H   predniSONE  40 mg Oral QAC breakfast   sodium zirconium cyclosilicate  5 g Oral BID   traZODone  50 mg Oral QHS   Continuous Infusions:  sodium chloride 50 mL/hr at 12/07/22 1817    LOS: 0 days   Time spent: 35  Nita Sells, MD [facility] To contact the attending provider between 7A-7P or the covering provider during after hours 7P-7A, please log into the web site [email or web address] and access using universal [place] password for that web site. If you do not have the password, please call the hospital operator.  12/08/2022, 12:54 PM

## 2022-12-08 NOTE — Plan of Care (Signed)
A/O today with forgetfulfness at times. Assisted OOB to [facility] and chair.

## 2022-12-09 DIAGNOSIS — J9691 Respiratory failure, unspecified with hypoxia: Secondary | ICD-10-CM | POA: Diagnosis not present

## 2022-12-09 DIAGNOSIS — W19XXXA Unspecified fall, initial encounter: Secondary | ICD-10-CM | POA: Diagnosis not present

## 2022-12-09 DIAGNOSIS — Y92009 Unspecified place in unspecified non-institutional (private) residence as the place of occurrence of the external cause: Secondary | ICD-10-CM | POA: Diagnosis not present

## 2022-12-09 LAB — SODIUM, URINE, RANDOM: Sodium, Ur: 84 mmol/L

## 2022-12-09 LAB — COMPREHENSIVE METABOLIC PANEL
ALT: 13 U/L (ref 0–44)
AST: 18 U/L (ref 15–41)
Albumin: 3.1 g/dL — ABNORMAL LOW (ref 3.5–5.0)
Alkaline Phosphatase: 43 U/L (ref 38–126)
Anion gap: 7 (ref 5–15)
BUN: 45 mg/dL — ABNORMAL HIGH (ref 8–23)
CO2: 22 mmol/L (ref 22–32)
Calcium: 8.3 mg/dL — ABNORMAL LOW (ref 8.9–10.3)
Chloride: 110 mmol/L (ref 98–111)
Creatinine, Ser: 1.96 mg/dL — ABNORMAL HIGH (ref 0.44–1.00)
GFR, Estimated: 23 mL/min — ABNORMAL LOW (ref >60)
Glucose, Bld: 85 mg/dL (ref 70–99)
Potassium: 4.7 mmol/L (ref 3.5–5.1)
Sodium: 139 mmol/L (ref 135–145)
Total Bilirubin: 0.5 mg/dL (ref 0.3–1.2)
Total Protein: 6.5 g/dL (ref 6.5–8.1)

## 2022-12-09 LAB — CBC
HCT: 34.2 % — ABNORMAL LOW (ref 36.0–46.0)
Hemoglobin: 10.7 g/dL — ABNORMAL LOW (ref 12.0–15.0)
MCH: 25.4 pg — ABNORMAL LOW (ref 26.0–34.0)
MCHC: 31.3 g/dL (ref 30.0–36.0)
MCV: 81 fL (ref 80.0–100.0)
Platelets: 152 10*3/uL (ref 150–400)
RBC: 4.22 MIL/uL (ref 3.87–5.11)
RDW: 14.1 % (ref 11.5–15.5)
WBC: 8.6 10*3/uL (ref 4.0–10.5)
nRBC: 0 % (ref 0.0–0.2)

## 2022-12-09 LAB — CREATININE, URINE, RANDOM: Creatinine, Urine: 45 mg/dL

## 2022-12-09 MED ORDER — SODIUM ZIRCONIUM CYCLOSILICATE 5 G PO PACK
5.0000 g | PACK | Freq: Every day | ORAL | Status: DC
  Administered 2022-12-09 – 2022-12-10 (×2): 5 g via ORAL
  Filled 2022-12-09 (×2): qty 1

## 2022-12-09 NOTE — Plan of Care (Signed)

## 2022-12-09 NOTE — Progress Notes (Signed)
Physical Therapy Treatment Patient Details Name: Diane Skinner MRN: [id number] DOB: 04/03/1923 Today's Date: 12/09/2022   History of Present Illness Patient is a 89 y.o. female admitted 12/05/22 after fall at her facility and found to be Covid positive. Past medical history significant for hypertension, arthritis, and previous cholecystectomy    PT Comments     Pt admitted with above diagnosis.  Pt currently with functional limitations due to the deficits listed below (see PT Problem List). Pt seated in recliner when PT arrived resting and able to rouse with light touch, pt agreeable to therapy intervention. Pt reported need to void. Pt required min guard for recliner and commode transfers with cues for safety and proper AD placement, improved activity tolerance with 4 minst standing with hygiene tasks and gait of 36 feet in personal room with RW and min guard, pt left seated in recliner, all needs met. PT communicated low PR with nurse. Pt will benefit from skilled PT to increase their independence and safety with mobility to allow discharge to the venue listed below.     Recommendations for follow up therapy are one component of a multi-disciplinary discharge planning process, led by the attending physician.  Recommendations may be updated based on patient status, additional functional criteria and insurance authorization.  Follow Up Recommendations  Skilled nursing-short term rehab (<3 hours/day) Can patient physically be transported by private vehicle: Yes   Assistance Recommended at Discharge Intermittent Supervision/Assistance  Patient can return home with the following A little help with walking and/or transfers;A little help with bathing/dressing/bathroom;Help with stairs or ramp for entrance;Assistance with cooking/housework;Assist for transportation   Equipment Recommendations  None recommended by PT    Recommendations for Other Services       Precautions / Restrictions  Precautions Precautions: Fall Restrictions Weight Bearing Restrictions: No     Mobility  Bed Mobility               General bed mobility comments: pt seated in recliner when PT arrived    Transfers Overall transfer level: Needs assistance Equipment used: Rolling walker (2 wheels) Transfers: Sit to/from Stand Sit to Stand: Min assist, Min guard           General transfer comment: cues for proper UE placement    Ambulation/Gait Ambulation/Gait assistance: Min guard Gait Distance (Feet): 36 Feet Assistive device: Rolling walker (2 wheels) Gait Pattern/deviations: Decreased stride length, Trunk flexed, Wide base of support (constant cues for proper distance from RW) Gait velocity: decreased     General Gait Details: O2 saturation 97-99% on RA no reports or s or s of SOB, PR low 47-49 and communciated with nursing staff   Stairs             Wheelchair Mobility    Modified Rankin (Stroke Patients Only)       Balance Overall balance assessment: Mild deficits observed, not formally tested                                          Cognition Arousal/Alertness: Awake/alert Behavior During Therapy: Flat affect                                   General Comments: able to follow one step commands and improved commuication with gestures  Exercises      General Comments        Pertinent Vitals/Pain      Home Living Family/patient expects to be discharged to:: Assisted living                 Home Equipment: Conservation officer, nature (2 wheels) Additional Comments: From [facility] (?Independent vs Assisted Living)    Prior Function            PT Goals (current goals can now be found in the care plan section) Acute Rehab PT Goals PT Goal Formulation: Patient unable to participate in goal setting Time For Goal Achievement: 12/20/22 Potential to Achieve Goals: Good    Frequency    Min 2X/week       PT Plan      Co-evaluation              AM-PAC PT "6 Clicks" Mobility   Outcome Measure  Help needed turning from your back to your side while in a flat bed without using bedrails?: A Little Help needed moving from lying on your back to sitting on the side of a flat bed without using bedrails?: A Little Help needed moving to and from a bed to a chair (including a wheelchair)?: A Little Help needed standing up from a chair using your arms (e.g., wheelchair or bedside chair)?: A Little Help needed to walk in hospital room?: A Little Help needed climbing 3-5 steps with a railing? : A Lot 6 Click Score: 17    End of Session Equipment Utilized During Treatment: Gait belt Activity Tolerance: Patient tolerated treatment well Patient left: in chair;with call bell/phone within reach;with chair alarm set Nurse Communication: Mobility status;Other (comment) (low PR) PT Visit Diagnosis: Difficulty in walking, not elsewhere classified (R26.2);Muscle weakness (generalized) (M62.81)     Time: [id number] PT Time Calculation (min) (ACUTE ONLY): 24 min  Charges:  $Gait Training: 8-22 mins $Therapeutic Activity: 8-22 mins                     Baird Lyons, PT    Adair Patter 12/09/2022, 4:08 PM

## 2022-12-09 NOTE — Progress Notes (Signed)
PROGRESS NOTE   Diane Skinner  [id number] DOB: 1923/04/20 DOA: 12/05/2022 PCP: Lucianne Lei, MD   Brief Narrative:   32 black female previously from [facility] moved to [place] to live at [facility] 02/2021 Pmh HTN renal disorder reflux B12 deficiency cognitive issues prior Namenda (discontinued in October 2018)-MoCA was 11/30 in November 2018 Presbycusis  Brought into emergency room 3/15  after sustaining a fall at [facility], caregiver found her on the ground Per niece who is HCPOA complained of R hip pain Was intermittently hypoxic--COVID - positive Hip x-ray no fracture dislocation, CT head no acute intracranial abnormality moderate generalized atrophy CXR no acute cardiopulmonary  Hospitalization complicated by elevated creatinine as well as hyperkalemia  Hospital-Problem based course  COVID-19 infection-relatively asymptomatic - Inflammatory markers have diminished and has no oxygen requirement - Transition Solu-Medrol to prednisone 40 to complete 10 days  - CXR 3.15 clear  Fall - CT head negative, no hip fracture - Ambulating well in room  Dementia with prior MoCA testing 11/30 - Orientable-monitor for confusion although it may just be her presbycusis which seems to cause her issues with understanding - Resume trazodone 50  Constitutional bradycardia at night - Heart rates in the 40s but rebounded immediately - No workup as she was sleeping at the time  Mild hyperkalemia in setting of ARB use Acute urinary retention secondary to hospitalization AKI superimposed on CKD 3B GFR less than 30 - EKG did not show any ST-T wave changes on 3/17 - has postvoid residuals less than 135 on rechecking with nursing staff on 3/19 -Get urinary sodium, urinary creatinine and increase fluid rate to 100 cc an hour for 18 hours - Keep Lokelma at 5 mg daily -Long discussion with niece   HTN history-would not treat a blood pressure unless it is due to  >150 - Hold losartan - May use amlodipine 10  B12 deficiency Resume B12 dosing  DVT prophylaxis: heparin Code Status: FULL Family Communication: discussed with the niece on the phone 2/19 who does not want aggressive measures if things were to worsen but does not want patient uncomfortable with In-N-Out cath- if the potassium and creatinine come down the patient can probably be discharged in a.m. 3/20--await stability Disposition:  Status is: Inpatient The patient will require care spanning > 2 midnights and should be moved to inpatient because:   Potassium is elevated, so his creatinine and patient will need decision regarding Foley catheter versus not tomorrow and then can discharge home   Subjective:  Awake coherent x2, quite hard of hearing  Objective: Vitals:   12/08/22 1303 12/08/22 2110 12/09/22 0511 12/09/22 1428  BP: 137/70 (!) 156/80 (!) 151/75 137/65  Pulse: (!) 49 (!) 55 (!) 46 (!) 48  Resp:  18 20 16   Temp: (!) 97.4 F (36.3 C) 98.4 F (36.9 C) 98.4 F (36.9 C) 98.2 F (36.8 C)  TempSrc: Oral Oral  Oral  SpO2: 98% 98% 98% 96%  Weight:      Height:        Intake/Output Summary (Last 24 hours) at 12/09/2022 1439 Last data filed at 12/09/2022 1411 Gross per 24 hour  Intake 2554.86 ml  Output 1775 ml  Net 779.86 ml    Filed Weights   12/05/22 1017  Weight: 56.7 kg    Examination:  EOMI NCAT looks well Chest is clear no wheeze rales rhonchi S1-S2 no murmur gallop Abdomen is soft no rebound no guarding ROM is intact No lower extremity  edema   Data Reviewed: personally reviewed   CBC    Component Value Date/Time   WBC 8.6 12/09/2022 0604   RBC 4.22 12/09/2022 0604   HGB 10.7 (L) 12/09/2022 0604   HGB 12.7 03/14/2021 1518   HCT 34.2 (L) 12/09/2022 0604   HCT 39.8 03/14/2021 1518   PLT 152 12/09/2022 0604   PLT 149 (L) 03/14/2021 1518   MCV 81.0 12/09/2022 0604   MCV 82 03/14/2021 1518   MCH 25.4 (L) 12/09/2022 0604   MCHC 31.3  12/09/2022 0604   RDW 14.1 12/09/2022 0604   RDW 14.3 03/14/2021 1518   LYMPHSABS 1.9 12/05/2022 1215   LYMPHSABS 2.1 03/14/2021 1518   MONOABS 0.8 12/05/2022 1215   EOSABS 0.0 12/05/2022 1215   EOSABS 0.0 03/14/2021 1518   BASOSABS 0.0 12/05/2022 1215   BASOSABS 0.0 03/14/2021 1518      Latest Ref Rng & Units 12/09/2022    6:04 AM 12/08/2022    5:33 AM 12/07/2022    5:18 AM  CMP  Glucose 70 - 99 mg/dL 85  96  130   BUN 8 - 23 mg/dL 45  44  40   Creatinine 0.44 - 1.00 mg/dL 1.96  1.93  1.98   Sodium 135 - 145 mmol/L 139  136  135   Potassium 3.5 - 5.1 mmol/L 4.7  5.5  5.6   Chloride 98 - 111 mmol/L 110  107  108   CO2 22 - 32 mmol/L 22  22  19    Calcium 8.9 - 10.3 mg/dL 8.3  8.3  8.6   Total Protein 6.5 - 8.1 g/dL 6.5  6.6    Total Bilirubin 0.3 - 1.2 mg/dL 0.5  0.8    Alkaline Phos 38 - 126 U/L 43  44    AST 15 - 41 U/L 18  15    ALT 0 - 44 U/L 13  9     Radiology Studies: No results found.  Scheduled Meds:  amLODipine  10 mg Oral Daily   cyanocobalamin  250 mcg Oral Daily   docusate sodium  100 mg Oral BID   gabapentin  300 mg Oral QHS   guaiFENesin  600 mg Oral BID   heparin  5,000 Units Subcutaneous Q8H   predniSONE  40 mg Oral QAC breakfast   sodium zirconium cyclosilicate  5 g Oral Daily   traZODone  50 mg Oral QHS   Continuous Infusions:  sodium chloride 50 mL/hr at 12/09/22 0542    LOS: 1 day   Time spent: [place], MD [facility] To contact the attending provider between 7A-7P or the covering provider during after hours 7P-7A, please log into the web site [email or web address] and access using universal [place] password for that web site. If you do not have the password, please call the hospital operator.  12/09/2022, 2:39 PM

## 2022-12-10 DIAGNOSIS — J9691 Respiratory failure, unspecified with hypoxia: Secondary | ICD-10-CM | POA: Diagnosis not present

## 2022-12-10 DIAGNOSIS — W19XXXA Unspecified fall, initial encounter: Secondary | ICD-10-CM | POA: Diagnosis not present

## 2022-12-10 DIAGNOSIS — Y92009 Unspecified place in unspecified non-institutional (private) residence as the place of occurrence of the external cause: Secondary | ICD-10-CM | POA: Diagnosis not present

## 2022-12-10 LAB — CULTURE, BLOOD (ROUTINE X 2)
Culture: NO GROWTH
Culture: NO GROWTH
Special Requests: ADEQUATE

## 2022-12-10 LAB — COMPREHENSIVE METABOLIC PANEL
ALT: 39 U/L (ref 0–44)
AST: 35 U/L (ref 15–41)
Albumin: 2.7 g/dL — ABNORMAL LOW (ref 3.5–5.0)
Alkaline Phosphatase: 40 U/L (ref 38–126)
Anion gap: 7 (ref 5–15)
BUN: 36 mg/dL — ABNORMAL HIGH (ref 8–23)
CO2: 20 mmol/L — ABNORMAL LOW (ref 22–32)
Calcium: 8.2 mg/dL — ABNORMAL LOW (ref 8.9–10.3)
Chloride: 112 mmol/L — ABNORMAL HIGH (ref 98–111)
Creatinine, Ser: 1.6 mg/dL — ABNORMAL HIGH (ref 0.44–1.00)
GFR, Estimated: 29 mL/min — ABNORMAL LOW (ref >60)
Glucose, Bld: 86 mg/dL (ref 70–99)
Potassium: 5.1 mmol/L (ref 3.5–5.1)
Sodium: 139 mmol/L (ref 135–145)
Total Bilirubin: 0.7 mg/dL (ref 0.3–1.2)
Total Protein: 6.1 g/dL — ABNORMAL LOW (ref 6.5–8.1)

## 2022-12-10 MED ORDER — GUAIFENESIN ER 600 MG PO TB12: 600.0000 mg | ORAL_TABLET | Freq: Two times a day (BID) | ORAL | 0 refills | Status: AC

## 2022-12-10 MED ORDER — TRAZODONE HCL 50 MG PO TABS: 50.0000 mg | ORAL_TABLET | Freq: Every day | ORAL | 0 refills | Status: DC

## 2022-12-10 MED ORDER — PREDNISONE 10 MG PO TABS: 10.0000 mg | ORAL_TABLET | Freq: Every day | ORAL | 0 refills | Status: DC

## 2022-12-10 MED ORDER — DOCUSATE SODIUM 100 MG PO CAPS: 100.0000 mg | ORAL_CAPSULE | Freq: Two times a day (BID) | ORAL | 0 refills | Status: AC

## 2022-12-10 MED ORDER — GABAPENTIN 300 MG PO CAPS: 300.0000 mg | ORAL_CAPSULE | Freq: Every day | ORAL | 0 refills | Status: AC

## 2022-12-10 MED ORDER — SODIUM ZIRCONIUM CYCLOSILICATE 5 G PO PACK: 5.0000 g | PACK | Freq: Every day | ORAL | 0 refills | Status: AC

## 2022-12-10 NOTE — Progress Notes (Signed)
Occupational Therapy Treatment Patient Details Name: Diane Skinner MRN: [id number] DOB: 13-Sep-1923 Today's Date: 12/10/2022   History of present illness Patient is a 89 y.o. female admitted 12/05/22 after fall at her facility and found to be Covid positive. Past medical history significant for hypertension, arthritis, and previous cholecystectomy   OT comments  Patient was noted to make progress towards goals today. Patient was better able to keep RW close to her and appeared to hear directional cues better today. Patient's discharge plan remains appropriate at this time. OT will continue to follow acutely.     Recommendations for follow up therapy are one component of a multi-disciplinary discharge planning process, led by the attending physician.  Recommendations may be updated based on patient status, additional functional criteria and insurance authorization.    Follow Up Recommendations  Skilled nursing-short term rehab (<3 hours/day)     Assistance Recommended at Discharge Frequent or constant Supervision/Assistance  Patient can return home with the following  A little help with walking and/or transfers;A little help with bathing/dressing/bathroom;Direct supervision/assist for financial management;Help with stairs or ramp for entrance;Assist for transportation;Direct supervision/assist for medications management;Assistance with cooking/housework         Precautions / Restrictions Precautions Precautions: Fall Restrictions Weight Bearing Restrictions: No       Mobility Bed Mobility               General bed mobility comments: sitting up with NT upon arrival and positioned in recliner at end of session         Balance Overall balance assessment: Mild deficits observed, not formally tested           ADL either performed or assessed with clinical judgement   ADL Overall ADL's : Needs assistance/impaired         Toilet Transfer: Min  guard;Ambulation;Rolling walker (2 wheels) Toilet Transfer Details (indicate cue type and reason): to transfer from 3in 1 to recliner on opposite side of room with increased time Toileting- Clothing Manipulation and Hygiene: Min guard;Sit to/from stand Toileting - Clothing Manipulation Details (indicate cue type and reason): increased time              Cognition Arousal/Alertness: Awake/alert Behavior During Therapy: Flat affect Overall Cognitive Status: No family/caregiver present to determine baseline cognitive functioning             General Comments: was able to follow commands better and appeared to have easier time hearing today        Exercises      Shoulder Instructions       General Comments      Pertinent Vitals/ Pain       Pain Assessment Pain Assessment: No/denies pain         Frequency  Min 2X/week        Progress Toward Goals  OT Goals(current goals can now be found in the care plan section)  Progress towards OT goals: Progressing toward goals     Plan Discharge plan remains appropriate       AM-PAC OT "6 Clicks" Daily Activity     Outcome Measure   Help from another person eating meals?: None Help from another person taking care of personal grooming?: A Little Help from another person toileting, which includes using toliet, bedpan, or urinal?: A Little Help from another person bathing (including washing, rinsing, drying)?: A Little Help from another person to put on and taking off regular upper body clothing?: A Little Help from another person  to put on and taking off regular lower body clothing?: A Lot 6 Click Score: 18    End of Session Equipment Utilized During Treatment: Gait belt;Rolling walker (2 wheels)  OT Visit Diagnosis: Unsteadiness on feet (R26.81);Other abnormalities of gait and mobility (R26.89);Muscle weakness (generalized) (M62.81)   Activity Tolerance Patient tolerated treatment well   Patient Left in  chair;with call bell/phone within reach;with chair alarm set;Other (comment) (NT in room)   Nurse Communication Mobility status        Time: [phone] OT Time Calculation (min): 17 min  Charges: OT General Charges $OT Visit: 1 Visit OT Treatments $Self Care/Home Management : 8-22 mins  Rennie Plowman, MS Acute Rehabilitation Department Office# [phone]   Willa Rough 12/10/2022, 11:06 AM

## 2022-12-10 NOTE — Plan of Care (Signed)
Called niece Virgilio Belling and reviewed discharge instructions with her

## 2022-12-10 NOTE — Plan of Care (Signed)
Verbal education provided 

## 2022-12-10 NOTE — TOC Progression Note (Signed)
Transition of Care [facility]) - Progression Note    Patient Details  Name: Diane Skinner MRN: [id number] Date of Birth: 1922/10/28  Transition of Care [facility] [place]) CM/SW Contact  Purcell Mouton, RN Phone Number: 12/10/2022, 10:43 AM  Clinical Narrative:     Please call pt's niece Virgilio Belling and caregiver Levada Dy [phone] to give discharge instruction. Caregiver will come pick pt up.   Expected Discharge Plan: [place] Barriers to Discharge: Continued Medical Work up  Expected Discharge Plan and [place] arrangements for the past 2 months: [place] Expected Discharge Date: 12/10/22                                     Social Determinants of Health (SDOH) Interventions SDOH Screenings   Food Insecurity: No Food Insecurity (12/05/2022)  Housing: Low Risk  (12/05/2022)  Transportation Needs: No Transportation Needs (12/05/2022)  Utilities: Not At Risk (12/05/2022)  Tobacco Use: Low Risk  (12/07/2022)    Readmission Risk Interventions     No data to display

## 2022-12-10 NOTE — Discharge Summary (Signed)
Physician Discharge Summary  Diane Skinner [id number] DOB: 11/06/1922 DOA: 12/05/2022  PCP: Lucianne Lei, MD  Admit date: 12/05/2022 Discharge date: 12/10/2022  Admitted From: ILF Disposition:  ILF  Discharge Condition:Stable CODE STATUS:FULL Diet recommendation: Heart Healthy   Brief/Interim Summary: Patient is a 89 year old female with history of hypertension, vitamin B12 deficiency from independent living facility with a fall.  Was also noted to be hypoxic on presentation.  COVID screen test, to be positive.  Hip x-ray did not show any fracture.  Started on steroids for hypoxia from COVID.  Currently COVID symptoms have resolved.  Remains on room air.  PT/OT recommended SNF, current plan is to [facility] she came from.  Medically stable for discharge.  Discharge planning discussed with niece on phone today.  Following problems were addressed during the hospitalization:  COVID-19 infection-relatively asymptomatic - Inflammatory markers have diminished and has no oxygen requirement now - On admission, she was hypoxic so started on steroids.  Continue tapering dose of steroids for few more days - CXR done on 3/15 did not show any pneumonia   Fall - CT head negative, no hip fracture - Ambulating well in room   Dementia with prior MoCA testing 11/30 - Orientable-monitor for confusion although it may just be her presbycusis which seems to cause her issues with understanding - Resume trazodone 50   Constitutional bradycardia at night - Heart rates in the 40-50s but rebounds immediately - Asymptomatic   Mild hyperkalemia in setting of ARB use Acute urinary retention secondary to hospitalization AKI superimposed on CKD 3B GFR less than 30 - EKG did not show any ST-T wave changes on 3/17 - has postvoid residuals less than 135 on rechecking with nursing staff on 3/19 - Baseline creatinine is up from 1.62.  Check BMP  in a week - Keep Lokelma at 5 mg daily for next few days.   Hypertension - Hold losartan - Continue amlodipine 10   B12 deficiency Resume B12 dosing  Discharge Diagnoses:  Principal Problem:   Fall at home, initial encounter Active Problems:   Thrombocytopenia ([place])   Hypertension   COVID-19   Hip pain, acute, right   Respiratory failure with hypoxia (HCC)   Peripheral neuropathy   COVID   Pressure injury of skin    Discharge Instructions  Discharge Instructions     Diet - low sodium heart healthy   Complete by: As directed    Discharge instructions   Complete by: As directed    1)Please take prescribed medications as instructed 2)Follow up with your PCP in a week.  Do a CBC and BMP tests during the follow-up   Increase activity slowly   Complete by: As directed    No wound care   Complete by: As directed       Allergies as of 12/10/2022   No Known Allergies      Medication List     STOP taking these medications    losartan 25 MG tablet Commonly known as: Cozaar   valsartan-hydrochlorothiazide 160-25 MG tablet Commonly known as: DIOVAN-HCT       TAKE these medications    amLODipine 10 MG tablet Commonly known as: NORVASC TAKE 1 TABLET BY MOUTH EVERY DAY   docusate sodium 100 MG capsule Commonly known as: COLACE Take 1 capsule (100 mg total) by mouth 2 (two) times daily for 14 days.   gabapentin 300 MG capsule Commonly known as: NEURONTIN Take 1  capsule (300 mg total) by mouth at bedtime. What changed: how much to take   guaiFENesin 600 MG 12 hr tablet Commonly known as: MUCINEX Take 1 tablet (600 mg total) by mouth 2 (two) times daily for 7 days.   ofloxacin 0.3 % OTIC solution Commonly known as: FLOXIN Place 2 drops into both ears 2 (two) times daily.   predniSONE 10 MG tablet Commonly known as: DELTASONE Take 1 tablet (10 mg total) by mouth daily before breakfast. Take 2 pills daily for 3 days then 1 pill daily for 3 days then  stop Start taking on: December 11, 2022   sodium zirconium cyclosilicate 5 g packet Commonly known as: LOKELMA Take 5 g by mouth daily for 7 days.   THERA-M PO Take by mouth daily.   traZODone 50 MG tablet Commonly known as: DESYREL Take 1 tablet (50 mg total) by mouth at bedtime.   VITAMIN B12 PO Take by mouth at bedtime.        Follow-up Information     Lucianne Lei, MD. Schedule an appointment as soon as possible for a visit in 1 week(s).   Specialty: Family Medicine Contact information: [address] [place] [address] [phone]                No Known Allergies  Consultations: None   Procedures/Studies: DG HIP UNILAT WITH PELVIS 2-3 VIEWS RIGHT  Result Date: 12/05/2022 CLINICAL DATA:  [id number] Fall [id number] EXAM: DG HIP (WITH OR WITHOUT PELVIS) 2-3V RIGHT COMPARISON:  CT 06/22/2022 FINDINGS: There is no evidence of hip fracture or dislocation. Diffuse osteopenia. Lower lumbar spondylitic changes. IMPRESSION: 1. No acute findings. 2. Diffuse osteopenia. Electronically Signed   By: Lucrezia Europe M.D.   On: 12/05/2022 16:37   CT HEAD WO CONTRAST (5MM)  Result Date: 12/05/2022 CLINICAL DATA:  Right history: Head trauma, minor. Additional history provided: Unwitnessed fall. EXAM: CT HEAD WITHOUT CONTRAST TECHNIQUE: Contiguous axial images were obtained from the base of the skull through the vertex without intravenous contrast. RADIATION DOSE REDUCTION: This exam was performed according to the departmental dose-optimization program which includes automated exposure control, adjustment of the mA and/or kV according to patient size and/or use of iterative reconstruction technique. COMPARISON:  Head CT 03/15/2021. FINDINGS: Brain: Moderate generalized cerebral atrophy. Patchy and ill-defined hypoattenuation within the cerebral white matter, nonspecific but compatible with moderate chronic small vessel ischemic disease. There is no acute intracranial hemorrhage. No  demarcated cortical infarct. No extra-axial fluid collection. No evidence of an intracranial mass. No midline shift. Vascular: Hyperdense vessel.  Atherosclerotic calcifications no Skull: No fracture or aggressive osseous lesion. Sinuses/Orbits: No mass or acute finding within the imaged orbits. Mild mucosal thickening within the left frontal sinus. Mild-to-moderate mucosal thickening within bilateral ethmoid air cells. Mild mucosal thickening within the bilateral sphenoid sinuses. Mild mucosal thickening within the bilateral maxillary sinuses at the imaged levels. IMPRESSION: 1.  No evidence of an acute intracranial abnormality. 2. Moderate chronic small vessel ischemic changes within the cerebral white matter. 3. Moderate generalized cerebral atrophy. 4. Paranasal sinus disease at the imaged levels, as described. Electronically Signed   By: Kellie Simmering D.O.   On: 12/05/2022 11:34   DG Chest Portable 1 View  Result Date: 12/05/2022 CLINICAL DATA:  Cough, short of EXAM: PORTABLE CHEST 1 VIEW COMPARISON:  02/28/2021 FINDINGS: Prominent cardiac silhouette. Normal pulmonary vasculature. No evidence of effusion, infiltrate, or pneumothorax. No acute bony abnormality. IMPRESSION: No acute cardiopulmonary process. Electronically Signed  By: Suzy Bouchard M.D.   On: 12/05/2022 10:53      Subjective: Patient seen and examined at bedside today.  Hemodynamically stable.  On room air.  Denies any worsening shortness of breath or cough.  Having bowel movements.  Medically stable for discharge back to ILF today.  I called her niece today and updated about the discharge planning  Discharge Exam: Vitals:   12/09/22 2115 12/10/22 0525  BP: (!) 145/80 132/67  Pulse: (!) 51 (!) 48  Resp: 20 18  Temp: 98.1 F (36.7 C) 97.7 F (36.5 C)  SpO2: 97% 93%   Vitals:   12/09/22 0511 12/09/22 1428 12/09/22 2115 12/10/22 0525  BP: (!) 151/75 137/65 (!) 145/80 132/67  Pulse: (!) 46 (!) 48 (!) 51 (!) 48  Resp: 20 16  20 18   Temp: 98.4 F (36.9 C) 98.2 F (36.8 C) 98.1 F (36.7 C) 97.7 F (36.5 C)  TempSrc:  Oral Oral Oral  SpO2: 98% 96% 97% 93%  Weight:      Height:        General: Pt is alert, awake, not in acute distress Cardiovascular: RRR, S1/S2 +, no rubs, no gallops Respiratory: CTA bilaterally, no wheezing, no rhonchi Abdominal: Soft, NT, ND, bowel sounds + Extremities: no edema, no cyanosis    The results of significant diagnostics from this hospitalization (including imaging, microbiology, ancillary and laboratory) are listed below for reference.     Microbiology: Recent Results (from the past 240 hour(s))  Resp panel by RT-PCR (RSV, Flu A&B, Covid) Anterior Nasal Swab     Status: Abnormal   Collection Time: 12/05/22 10:30 AM   Specimen: Anterior Nasal Swab  Result Value Ref Range Status   SARS Coronavirus 2 by RT PCR POSITIVE (A) NEGATIVE Final    Comment: (NOTE) SARS-CoV-2 target nucleic acids are DETECTED.  The SARS-CoV-2 RNA is generally detectable in upper respiratory specimens during the acute phase of infection. Positive results are indicative of the presence of the identified virus, but do not rule out bacterial infection or co-infection with other pathogens not detected by the test. Clinical correlation with patient history and other diagnostic information is necessary to determine patient infection status. The expected result is Negative.  Fact Sheet for Patients: EntrepreneurPulse.com.au  Fact Sheet for Healthcare Providers: IncredibleEmployment.be  This test is not yet approved or cleared by the [place] FDA and  has been authorized for detection and/or diagnosis of SARS-CoV-2 by FDA under an Emergency Use Authorization (EUA).  This EUA will remain in effect (meaning this test can be used) for the duration of  the COVID-19 declaration under Section 564(b)(1) of the A ct, 21 U.S.C. section 360bbb-3(b)(1), unless the  authorization is terminated or revoked sooner.     Influenza A by PCR NEGATIVE NEGATIVE Final   Influenza B by PCR NEGATIVE NEGATIVE Final    Comment: (NOTE) The Xpert Xpress SARS-CoV-2/FLU/RSV plus assay is intended as an aid in the diagnosis of influenza from Nasopharyngeal swab specimens and should not be used as a sole basis for treatment. Nasal washings and aspirates are unacceptable for Xpert Xpress SARS-CoV-2/FLU/RSV testing.  Fact Sheet for Patients: EntrepreneurPulse.com.au  Fact Sheet for Healthcare Providers: IncredibleEmployment.be  This test is not yet approved or cleared by the [place] FDA and has been authorized for detection and/or diagnosis of SARS-CoV-2 by FDA under an Emergency Use Authorization (EUA). This EUA will remain in effect (meaning this test can be used) for the duration of the COVID-19 declaration  under Section 564(b)(1) of the Act, 21 U.S.C. section 360bbb-3(b)(1), unless the authorization is terminated or revoked.     Resp Syncytial Virus by PCR NEGATIVE NEGATIVE Final    Comment: (NOTE) Fact Sheet for Patients: EntrepreneurPulse.com.au  Fact Sheet for Healthcare Providers: IncredibleEmployment.be  This test is not yet approved or cleared by the [place] FDA and has been authorized for detection and/or diagnosis of SARS-CoV-2 by FDA under an Emergency Use Authorization (EUA). This EUA will remain in effect (meaning this test can be used) for the duration of the COVID-19 declaration under Section 564(b)(1) of the Act, 21 U.S.C. section 360bbb-3(b)(1), unless the authorization is terminated or revoked.  Performed at [facility], [place] [address], [place], [place] [address]   Blood culture (routine x 2)     Status: None   Collection Time: 12/05/22 12:15 PM   Specimen: BLOOD  Result Value Ref Range Status   Specimen Description   Final     BLOOD SITE NOT SPECIFIED Performed at [facility] [address], [place], [place] [address]    Special Requests   Final    BOTTLES DRAWN AEROBIC AND ANAEROBIC Blood Culture adequate volume Performed at [facility] [address], [place], [place] [address]    Culture   Final    NO GROWTH 5 DAYS Performed at [facility], [place] [address] [place], [place] [address]    Report Status 12/10/2022 FINAL  Final  Blood culture (routine x 2)     Status: None   Collection Time: 12/05/22  5:58 PM   Specimen: BLOOD  Result Value Ref Range Status   Specimen Description   Final    BLOOD BLOOD RIGHT ARM Performed at [facility] [address] [place], [place] [address]    Special Requests   Final    BOTTLES DRAWN AEROBIC AND ANAEROBIC Blood Culture results may not be optimal due to an inadequate volume of blood received in culture bottles Performed at [facility] [address] [place], [place] [address]    Culture   Final    NO GROWTH 5 DAYS Performed at [facility], [place] [address] [place], [place] [address]    Report Status 12/10/2022 FINAL  Final     Labs: BNP (last 3 results) No results for input(s): "BNP" in the last 8760 hours. Basic Metabolic Panel: Recent Labs  Lab 12/06/22 0539 12/07/22 0518 12/08/22 0533 12/09/22 0604 12/10/22 0557  NA 138 135 136 139 139  K 5.0 5.6* 5.5* 4.7 5.1  CL 109 108 107 110 112*  CO2 21* 19* 22 22 20*  GLUCOSE 91 130* 96 85 86  BUN 35* 40* 44* 45* 36*  CREATININE 1.69* 1.98* 1.93* 1.96* 1.60*  CALCIUM 8.4* 8.6* 8.3* 8.3* 8.2*   Liver Function Tests: Recent Labs  Lab 12/05/22 1215 12/08/22 0533 12/09/22 0604 12/10/22 0557  AST 18 15 18  35  ALT 11 9 13  39  ALKPHOS 60 44 43 40  BILITOT 1.0 0.8 0.5 0.7  PROT 7.8 6.6 6.5 6.1*  ALBUMIN 3.8 3.0* 3.1* 2.7*   No results for input(s): "LIPASE", "AMYLASE" in the last 168 hours. No results for  input(s): "AMMONIA" in the last 168 hours. CBC: Recent Labs  Lab 12/05/22 1215 12/05/22 1758 12/06/22 0539 12/07/22 0518 12/08/22 0533 12/09/22 0604  WBC 8.4 6.5 8.9 7.1 9.7 8.6  NEUTROABS 5.6  --   --   --   --   --  HGB 12.3 12.2 11.5* 11.5* 10.5* 10.7*  HCT 40.7 40.7 37.5 37.7 33.4* 34.2*  MCV 83.9 84.3 83.5 82.5 80.9 81.0  PLT 149* 135* 123* 134* 148* 152   Cardiac Enzymes: No results for input(s): "CKTOTAL", "CKMB", "CKMBINDEX", "TROPONINI" in the last 168 hours. BNP: Invalid input(s): "POCBNP" CBG: Recent Labs  Lab 12/05/22 1017  GLUCAP 84   D-Dimer Recent Labs    12/08/22 0533  DDIMER 1.23*   Hgb A1c No results for input(s): "HGBA1C" in the last 72 hours. Lipid Profile No results for input(s): "CHOL", "HDL", "LDLCALC", "TRIG", "CHOLHDL", "LDLDIRECT" in the last 72 hours. Thyroid function studies No results for input(s): "TSH", "T4TOTAL", "T3FREE", "THYROIDAB" in the last 72 hours.  Invalid input(s): "FREET3" Anemia work up No results for input(s): "VITAMINB12", "FOLATE", "FERRITIN", "TIBC", "IRON", "RETICCTPCT" in the last 72 hours. Urinalysis    Component Value Date/Time   COLORURINE YELLOW 06/22/2022 0625   APPEARANCEUR CLEAR 06/22/2022 0625   LABSPEC 1.014 06/22/2022 0625   PHURINE 5.0 06/22/2022 0625   GLUCOSEU NEGATIVE 06/22/2022 0625   HGBUR SMALL (A) 06/22/2022 0625   BILIRUBINUR NEGATIVE 06/22/2022 0625   KETONESUR NEGATIVE 06/22/2022 0625   PROTEINUR >=300 (A) 06/22/2022 0625   UROBILINOGEN 0.2 03/08/2021 1316   NITRITE NEGATIVE 06/22/2022 0625   LEUKOCYTESUR NEGATIVE 06/22/2022 0625   Sepsis Labs Recent Labs  Lab 12/06/22 0539 12/07/22 0518 12/08/22 0533 12/09/22 0604  WBC 8.9 7.1 9.7 8.6   Microbiology Recent Results (from the past 240 hour(s))  Resp panel by RT-PCR (RSV, Flu A&B, Covid) Anterior Nasal Swab     Status: Abnormal   Collection Time: 12/05/22 10:30 AM   Specimen: Anterior Nasal Swab  Result Value Ref Range Status    SARS Coronavirus 2 by RT PCR POSITIVE (A) NEGATIVE Final    Comment: (NOTE) SARS-CoV-2 target nucleic acids are DETECTED.  The SARS-CoV-2 RNA is generally detectable in upper respiratory specimens during the acute phase of infection. Positive results are indicative of the presence of the identified virus, but do not rule out bacterial infection or co-infection with other pathogens not detected by the test. Clinical correlation with patient history and other diagnostic information is necessary to determine patient infection status. The expected result is Negative.  Fact Sheet for Patients: EntrepreneurPulse.com.au  Fact Sheet for Healthcare Providers: IncredibleEmployment.be  This test is not yet approved or cleared by the [place] FDA and  has been authorized for detection and/or diagnosis of SARS-CoV-2 by FDA under an Emergency Use Authorization (EUA).  This EUA will remain in effect (meaning this test can be used) for the duration of  the COVID-19 declaration under Section 564(b)(1) of the A ct, 21 U.S.C. section 360bbb-3(b)(1), unless the authorization is terminated or revoked sooner.     Influenza A by PCR NEGATIVE NEGATIVE Final   Influenza B by PCR NEGATIVE NEGATIVE Final    Comment: (NOTE) The Xpert Xpress SARS-CoV-2/FLU/RSV plus assay is intended as an aid in the diagnosis of influenza from Nasopharyngeal swab specimens and should not be used as a sole basis for treatment. Nasal washings and aspirates are unacceptable for Xpert Xpress SARS-CoV-2/FLU/RSV testing.  Fact Sheet for Patients: EntrepreneurPulse.com.au  Fact Sheet for Healthcare Providers: IncredibleEmployment.be  This test is not yet approved or cleared by the [place] FDA and has been authorized for detection and/or diagnosis of SARS-CoV-2 by FDA under an Emergency Use Authorization (EUA). This EUA will remain in effect  (meaning this test can be used) for the duration of the  COVID-19 declaration under Section 564(b)(1) of the Act, 21 U.S.C. section 360bbb-3(b)(1), unless the authorization is terminated or revoked.     Resp Syncytial Virus by PCR NEGATIVE NEGATIVE Final    Comment: (NOTE) Fact Sheet for Patients: EntrepreneurPulse.com.au  Fact Sheet for Healthcare Providers: IncredibleEmployment.be  This test is not yet approved or cleared by the [place] FDA and has been authorized for detection and/or diagnosis of SARS-CoV-2 by FDA under an Emergency Use Authorization (EUA). This EUA will remain in effect (meaning this test can be used) for the duration of the COVID-19 declaration under Section 564(b)(1) of the Act, 21 U.S.C. section 360bbb-3(b)(1), unless the authorization is terminated or revoked.  Performed at [facility], [place] [address] [place], [place] [address]   Blood culture (routine x 2)     Status: None   Collection Time: 12/05/22 12:15 PM   Specimen: BLOOD  Result Value Ref Range Status   Specimen Description   Final    BLOOD SITE NOT SPECIFIED Performed at [facility] [address] [place], [place] [address]    Special Requests   Final    BOTTLES DRAWN AEROBIC AND ANAEROBIC Blood Culture adequate volume Performed at [facility] [address], [place], [place] [address]    Culture   Final    NO GROWTH 5 DAYS Performed at [facility], [place] [address] [place], [place] [address]    Report Status 12/10/2022 FINAL  Final  Blood culture (routine x 2)     Status: None   Collection Time: 12/05/22  5:58 PM   Specimen: BLOOD  Result Value Ref Range Status   Specimen Description   Final    BLOOD BLOOD RIGHT ARM Performed at [facility] [address], [place], [place] [address]    Special Requests   Final    BOTTLES DRAWN AEROBIC AND ANAEROBIC Blood  Culture results may not be optimal due to an inadequate volume of blood received in culture bottles Performed at [facility] [address] [place], [place] [address]    Culture   Final    NO GROWTH 5 DAYS Performed at [facility], [place] [address], [place],  [address]    Report Status 12/10/2022 FINAL  Final    Please note: You were cared for by a hospitalist during your hospital stay. Once you are discharged, your primary care physician will handle any further medical issues. Please note that NO REFILLS for any discharge medications will be authorized once you are discharged, as it is imperative that you return to your primary care physician (or establish a relationship with a primary care physician if you do not have one) for your post hospital discharge needs so that they can reassess your need for medications and monitor your lab values.    Time coordinating discharge: 40 minutes  SIGNED:   Shelly Coss, MD  [facility] 12/10/2022, 9:51 AM Pager [phone]  If 7PM-7AM, please contact night-coverage [email or web address] Password [email or web address]

## 2022-12-11 ENCOUNTER — Telehealth: Payer: Self-pay

## 2022-12-11 NOTE — Transitions of Care (Post Inpatient/ED Visit) (Signed)
   12/11/2022  Name: Diane Skinner MRN: [id number] DOB: 1923-01-25  Today's TOC FU Call Status: Today's TOC FU Call Status:: Successful TOC FU Call Competed Unsuccessful Call (1st Attempt) Date: 12/11/22 [facility] FU Call Complete Date: 12/11/22  Transition Care Management Follow-up Telephone Call Date of Discharge: 12/10/22 Discharge Facility: [facility]) Type of Discharge: Inpatient Admission Primary Inpatient Discharge Diagnosis:: fall How have you been since you were released from the hospital?: Better Any questions or concerns?: No  Items Reviewed: Did you receive and understand the discharge instructions provided?: Yes Medications obtained and verified?: Yes (Medications Reviewed) Any new allergies since your discharge?: No Dietary orders reviewed?: Yes  Home Care and Equipment/Supplies: [facility] Ordered?: Yes Name of [place]:: unknown Has Agency set up a time to come to your home?: No EMR reviewed for Home Health Orders: Orders present/patient has not received call (refer to CM for follow-up) Any new equipment or medical supplies ordered?: NA  Functional Questionnaire: Do you need assistance with bathing/showering or dressing?: Yes Do you need assistance with meal preparation?: No Do you need assistance with eating?: No Do you have difficulty maintaining continence: Yes Do you need assistance with getting out of bed/getting out of a chair/moving?: Yes Do you have difficulty managing or taking your medications?: Yes  Follow up appointments reviewed: PCP Follow-up appointment confirmed?: No (no avail appt times, sent staff to schedule) MD Provider Line Number:[phone] Given: No [facility] Follow-up appointment confirmed?: NA Do you need transportation to your follow-up appointment?: No Do you understand care options if your condition(s) worsen?: Yes-patient verbalized understanding    [place], [address] Direct Dial [phone]

## 2022-12-11 NOTE — Transitions of Care (Post Inpatient/ED Visit) (Signed)
   12/11/2022  Name: Diane Skinner MRN: [id number] DOB: 1923-05-25  Today's TOC FU Call Status: Today's TOC FU Call Status:: Unsuccessul Call (1st Attempt) Unsuccessful Call (1st Attempt) Date: 12/11/22  Attempted to reach the patient regarding the most recent Inpatient/ED visit.  Follow Up Plan: Additional outreach attempts will be made to reach the patient to complete the Transitions of Care (Post Inpatient/ED visit) call.   Signature Juanda [place]rum[place], [place] Direct Dial [phone]

## 2023-07-01 ENCOUNTER — Ambulatory Visit
Admission: RE | Admit: 2023-07-01 | Discharge: 2023-07-01 | Disposition: A | Discharge status: Home / Self Care | Payer: Medicare PPO | Source: Ambulatory Visit | Attending: Family Medicine | Admitting: Family Medicine

## 2023-07-01 ENCOUNTER — Other Ambulatory Visit: Payer: Self-pay | Admitting: Family Medicine

## 2023-07-01 DIAGNOSIS — M542 Cervicalgia: Secondary | ICD-10-CM

## 2023-09-11 ENCOUNTER — Encounter (HOSPITAL_COMMUNITY): Payer: Self-pay

## 2023-09-11 ENCOUNTER — Emergency Department (HOSPITAL_COMMUNITY): Payer: Medicare PPO

## 2023-09-11 ENCOUNTER — Other Ambulatory Visit: Payer: Self-pay

## 2023-09-11 ENCOUNTER — Inpatient Hospital Stay (HOSPITAL_COMMUNITY)
Admission: EM | Admit: 2023-09-11 | Discharge: 2023-09-14 | DRG: 092 | Disposition: A | Discharge status: Home / Self Care | Payer: Medicare PPO | Attending: Internal Medicine | Admitting: Internal Medicine

## 2023-09-11 DIAGNOSIS — R531 Weakness: Secondary | ICD-10-CM | POA: Diagnosis not present

## 2023-09-11 DIAGNOSIS — I10 Essential (primary) hypertension: Secondary | ICD-10-CM | POA: Diagnosis present

## 2023-09-11 DIAGNOSIS — T43225A Adverse effect of selective serotonin reuptake inhibitors, initial encounter: Secondary | ICD-10-CM | POA: Diagnosis present

## 2023-09-11 DIAGNOSIS — D631 Anemia in chronic kidney disease: Secondary | ICD-10-CM | POA: Diagnosis present

## 2023-09-11 DIAGNOSIS — F0280 Dementia in other diseases classified elsewhere without behavioral disturbance: Secondary | ICD-10-CM

## 2023-09-11 DIAGNOSIS — G309 Alzheimer's disease, unspecified: Secondary | ICD-10-CM | POA: Diagnosis present

## 2023-09-11 DIAGNOSIS — R001 Bradycardia, unspecified: Secondary | ICD-10-CM | POA: Diagnosis present

## 2023-09-11 DIAGNOSIS — M8580 Other specified disorders of bone density and structure, unspecified site: Secondary | ICD-10-CM | POA: Diagnosis present

## 2023-09-11 DIAGNOSIS — Z886 Allergy status to analgesic agent status: Secondary | ICD-10-CM

## 2023-09-11 DIAGNOSIS — I77819 Aortic ectasia, unspecified site: Secondary | ICD-10-CM | POA: Diagnosis present

## 2023-09-11 DIAGNOSIS — R251 Tremor, unspecified: Secondary | ICD-10-CM | POA: Diagnosis present

## 2023-09-11 DIAGNOSIS — F32A Depression, unspecified: Secondary | ICD-10-CM

## 2023-09-11 DIAGNOSIS — F5105 Insomnia due to other mental disorder: Secondary | ICD-10-CM | POA: Diagnosis present

## 2023-09-11 DIAGNOSIS — N189 Chronic kidney disease, unspecified: Secondary | ICD-10-CM | POA: Diagnosis present

## 2023-09-11 DIAGNOSIS — M1990 Unspecified osteoarthritis, unspecified site: Secondary | ICD-10-CM | POA: Diagnosis present

## 2023-09-11 DIAGNOSIS — N179 Acute kidney failure, unspecified: Secondary | ICD-10-CM | POA: Diagnosis not present

## 2023-09-11 DIAGNOSIS — I1310 Hypertensive heart and chronic kidney disease without heart failure, with stage 1 through stage 4 chronic kidney disease, or unspecified chronic kidney disease: Secondary | ICD-10-CM | POA: Diagnosis present

## 2023-09-11 DIAGNOSIS — F0283 Dementia in other diseases classified elsewhere, unspecified severity, with mood disturbance: Secondary | ICD-10-CM | POA: Diagnosis present

## 2023-09-11 DIAGNOSIS — Z9049 Acquired absence of other specified parts of digestive tract: Secondary | ICD-10-CM

## 2023-09-11 DIAGNOSIS — E875 Hyperkalemia: Secondary | ICD-10-CM | POA: Diagnosis not present

## 2023-09-11 DIAGNOSIS — G251 Drug-induced tremor: Principal | ICD-10-CM | POA: Diagnosis present

## 2023-09-11 DIAGNOSIS — D649 Anemia, unspecified: Secondary | ICD-10-CM | POA: Diagnosis present

## 2023-09-11 DIAGNOSIS — E860 Dehydration: Secondary | ICD-10-CM | POA: Diagnosis present

## 2023-09-11 DIAGNOSIS — Z79899 Other long term (current) drug therapy: Secondary | ICD-10-CM

## 2023-09-11 DIAGNOSIS — Z8249 Family history of ischemic heart disease and other diseases of the circulatory system: Secondary | ICD-10-CM

## 2023-09-11 DIAGNOSIS — F02818 Dementia in other diseases classified elsewhere, unspecified severity, with other behavioral disturbance: Secondary | ICD-10-CM | POA: Diagnosis present

## 2023-09-11 DIAGNOSIS — N184 Chronic kidney disease, stage 4 (severe): Secondary | ICD-10-CM | POA: Diagnosis present

## 2023-09-11 LAB — COMPREHENSIVE METABOLIC PANEL
ALT: 8 U/L (ref 0–44)
AST: 15 U/L (ref 15–41)
Albumin: 3.3 g/dL — ABNORMAL LOW (ref 3.5–5.0)
Alkaline Phosphatase: 60 U/L (ref 38–126)
Anion gap: 9 (ref 5–15)
BUN: 48 mg/dL — ABNORMAL HIGH (ref 8–23)
CO2: 19 mmol/L — ABNORMAL LOW (ref 22–32)
Calcium: 8.2 mg/dL — ABNORMAL LOW (ref 8.9–10.3)
Chloride: 106 mmol/L (ref 98–111)
Creatinine, Ser: 2.69 mg/dL — ABNORMAL HIGH (ref 0.44–1.00)
GFR, Estimated: 16 mL/min — ABNORMAL LOW (ref >60)
Glucose, Bld: 88 mg/dL (ref 70–99)
Potassium: 5.3 mmol/L — ABNORMAL HIGH (ref 3.5–5.1)
Sodium: 134 mmol/L — ABNORMAL LOW (ref 135–145)
Total Bilirubin: 0.5 mg/dL (ref <1.2)
Total Protein: 6.4 g/dL — ABNORMAL LOW (ref 6.5–8.1)

## 2023-09-11 LAB — URINALYSIS, ROUTINE W REFLEX MICROSCOPIC
Bilirubin Urine: NEGATIVE
Glucose, UA: NEGATIVE mg/dL
Hgb urine dipstick: NEGATIVE
Ketones, ur: NEGATIVE mg/dL
Leukocytes,Ua: NEGATIVE
Nitrite: NEGATIVE
Protein, ur: NEGATIVE mg/dL
Specific Gravity, Urine: 1.004 — ABNORMAL LOW (ref 1.005–1.030)
pH: 5 (ref 5.0–8.0)

## 2023-09-11 LAB — CBC WITH DIFFERENTIAL/PLATELET
Abs Immature Granulocytes: 0.04 10*3/uL (ref 0.00–0.07)
Basophils Absolute: 0 10*3/uL (ref 0.0–0.1)
Basophils Relative: 0 %
Eosinophils Absolute: 0 10*3/uL (ref 0.0–0.5)
Eosinophils Relative: 0 %
HCT: 37.1 % (ref 36.0–46.0)
Hemoglobin: 11.4 g/dL — ABNORMAL LOW (ref 12.0–15.0)
Immature Granulocytes: 0 %
Lymphocytes Relative: 22 %
Lymphs Abs: 2.1 10*3/uL (ref 0.7–4.0)
MCH: 25.6 pg — ABNORMAL LOW (ref 26.0–34.0)
MCHC: 30.7 g/dL (ref 30.0–36.0)
MCV: 83.2 fL (ref 80.0–100.0)
Monocytes Absolute: 0.6 10*3/uL (ref 0.1–1.0)
Monocytes Relative: 7 %
Neutro Abs: 6.7 10*3/uL (ref 1.7–7.7)
Neutrophils Relative %: 71 %
Platelets: 143 10*3/uL — ABNORMAL LOW (ref 150–400)
RBC: 4.46 MIL/uL (ref 3.87–5.11)
RDW: 14.6 % (ref 11.5–15.5)
WBC: 9.5 10*3/uL (ref 4.0–10.5)
nRBC: 0 % (ref 0.0–0.2)

## 2023-09-11 LAB — RESP PANEL BY RT-PCR (RSV, FLU A&B, COVID)  RVPGX2
Influenza A by PCR: NEGATIVE
Influenza B by PCR: NEGATIVE
Resp Syncytial Virus by PCR: NEGATIVE
SARS Coronavirus 2 by RT PCR: NEGATIVE

## 2023-09-11 LAB — URINALYSIS, W/ REFLEX TO CULTURE (INFECTION SUSPECTED)
Bacteria, UA: NONE SEEN
Bilirubin Urine: NEGATIVE
Glucose, UA: NEGATIVE mg/dL
Hgb urine dipstick: NEGATIVE
Ketones, ur: NEGATIVE mg/dL
Leukocytes,Ua: NEGATIVE
Nitrite: NEGATIVE
Protein, ur: NEGATIVE mg/dL
Specific Gravity, Urine: 1.005 (ref 1.005–1.030)
pH: 5 (ref 5.0–8.0)

## 2023-09-11 LAB — TROPONIN I (HIGH SENSITIVITY)
Troponin I (High Sensitivity): 14 ng/L (ref <18)
Troponin I (High Sensitivity): 13 ng/L (ref <18)

## 2023-09-11 LAB — MAGNESIUM: Magnesium: 2.4 mg/dL (ref 1.7–2.4)

## 2023-09-11 LAB — CK: Total CK: 36 U/L — ABNORMAL LOW (ref 38–234)

## 2023-09-11 LAB — TSH: TSH: 3.156 u[IU]/mL (ref 0.350–4.500)

## 2023-09-11 LAB — MRSA NEXT GEN BY PCR, NASAL: MRSA by PCR Next Gen: NOT DETECTED

## 2023-09-11 MED ORDER — AMLODIPINE BESYLATE 10 MG PO TABS
10.0000 mg | ORAL_TABLET | Freq: Every day | ORAL | Status: DC
  Administered 2023-09-12 – 2023-09-14 (×3): 10 mg via ORAL
  Filled 2023-09-11 (×3): qty 1

## 2023-09-11 MED ORDER — TRAZODONE HCL 50 MG PO TABS
50.0000 mg | ORAL_TABLET | Freq: Every day | ORAL | Status: DC
  Administered 2023-09-11 – 2023-09-13 (×3): 50 mg via ORAL
  Filled 2023-09-11 (×3): qty 1

## 2023-09-11 MED ORDER — CALCIUM GLUCONATE-NACL 2-0.675 GM/100ML-% IV SOLN
2.0000 g | Freq: Once | INTRAVENOUS | Status: AC
  Administered 2023-09-11: 2000 mg via INTRAVENOUS
  Filled 2023-09-11: qty 100

## 2023-09-11 MED ORDER — ACETAMINOPHEN 650 MG RE SUPP: 650.0000 mg | Freq: Four times a day (QID) | RECTAL | Status: DC | PRN

## 2023-09-11 MED ORDER — SODIUM CHLORIDE 0.9% FLUSH
3.0000 mL | Freq: Two times a day (BID) | INTRAVENOUS | Status: DC
  Administered 2023-09-12 – 2023-09-13 (×4): 3 mL via INTRAVENOUS

## 2023-09-11 MED ORDER — SODIUM CHLORIDE 0.9 % IV BOLUS
500.0000 mL | Freq: Once | INTRAVENOUS | Status: AC
  Administered 2023-09-11: 500 mL via INTRAVENOUS

## 2023-09-11 MED ORDER — ENOXAPARIN SODIUM 30 MG/0.3ML IJ SOSY
30.0000 mg | PREFILLED_SYRINGE | INTRAMUSCULAR | Status: DC
  Administered 2023-09-11 – 2023-09-13 (×3): 30 mg via SUBCUTANEOUS
  Filled 2023-09-11 (×3): qty 0.3

## 2023-09-11 MED ORDER — SODIUM CHLORIDE 0.9 % IV SOLN: INTRAVENOUS | Status: DC

## 2023-09-11 MED ORDER — ALBUTEROL SULFATE (2.5 MG/3ML) 0.083% IN NEBU: 2.5000 mg | INHALATION_SOLUTION | Freq: Four times a day (QID) | RESPIRATORY_TRACT | Status: DC | PRN

## 2023-09-11 MED ORDER — CITALOPRAM HYDROBROMIDE 20 MG PO TABS: 20.0000 mg | ORAL_TABLET | Freq: Every day | ORAL | Status: DC

## 2023-09-11 MED ORDER — ACETAMINOPHEN 325 MG PO TABS: 650.0000 mg | ORAL_TABLET | Freq: Four times a day (QID) | ORAL | Status: DC | PRN

## 2023-09-11 NOTE — ED Triage Notes (Signed)
Pt BIB EMS from [facility] for weakness and shaking for 2 days. Usually independent with walker and has had increased weakness. [email or web address]

## 2023-09-11 NOTE — H&P (Addendum)
History and Physical    Patient: Diane Skinner SAY:[id number] DOB: Dec 21, 1922 DOA: 09/11/2023 DOS: the patient was seen and examined on 09/11/2023 PCP: Renaye Rakers, MD  Patient coming from: [facility] via EMS  Chief Complaint:  Chief Complaint  Patient presents with   Weakness   HPI: Diane Skinner is a 89 y.o. female with medical history significant of  chronic kidney disease, hypertension, and depression who presents with new onset tremors and weakness. The patient's caregiver reported that she had been experiencing these symptoms, which were severe enough to interfere with dressing. She had not been feeling well for a few days prior to the hospital admission.  She had recently started on a new medication, Celexa, prescribed by her primary care physician, Dr. Parke Simmers, for depression and sleep disturbances. Her other medications include Gabapentin, two unspecified blood pressure medications, a potassium supplement, and B12.  Her appetite has reportedly decreased, and she has been eating smaller meals. She also has a history of bloating and discomfort if she consumes too much food at once. Her fluid intake has been inadequate, leading to concerns about dehydration.  Her kidney function has reportedly decreased, and she has a history of chronic kidney disease. Despite this, she has never required dialysis. Her electrolyte levels were also found to be abnormal, with elevated potassium and low calcium levels.  She lives in a care facility and is primarily cared for by a caregiver named Marylene Land. Her great-niece, who is also her power of attorney, assists in coordinating her care. Her medications are prepackaged by a local pharmacy and delivered to her residence.  In the emergency department patient was noted to be afebrile with pulse 48-51, and all other vital signs maintained..  Labs significant for sodium 134, potassium 5.3, BUN 48, creatinine 2.69, high-sensitivity troponin negative x  2.  Influenza, COVID-19, and RSV screening were negative.  Chest x-ray noted enlarged heart with some vascular congestion, and a ectatic tortuous aorta.  Patient had been given 500 mL bolus normal saline IV fluids.   Review of Systems: As mentioned in the history of present illness. All other systems reviewed and are negative. Past Medical History:  Diagnosis Date   Arthritis    Hypertension    Renal disorder    Past Surgical History:  Procedure Laterality Date   BREAST SURGERY     CHOLECYSTECTOMY     Social History:  reports that she has never smoked. She has never used smokeless tobacco. She reports that she does not currently use alcohol. She reports that she does not currently use drugs.  Allergies  Allergen Reactions   Ibuprofen Swelling    Family History  Problem Relation Age of Onset   Hypertension Mother    Heart disease Mother    Breast cancer Sister     Prior to Admission medications   Medication Sig Start Date End Date Taking? Authorizing Provider  amLODipine (NORVASC) 10 MG tablet TAKE 1 TABLET BY MOUTH EVERY DAY 02/25/22  Yes Ellison Carwin, MD  citalopram (CELEXA) 20 MG tablet Take 20 mg by mouth daily. 08/27/23  Yes [provider]  Cyanocobalamin (VITAMIN B12 PO) Take by mouth at bedtime.   Yes [provider]  gabapentin (NEURONTIN) 300 MG capsule Take 1 capsule (300 mg total) by mouth at bedtime. Patient taking differently: Take 600 mg by mouth at bedtime. 12/10/22  Yes Burnadette Pop, MD  Multiple Vitamins-Minerals (THERA-M PO) Take by mouth daily.   Yes [provider]  traZODone (  DESYREL) 50 MG tablet Take 1 tablet (50 mg total) by mouth at bedtime. 12/10/22  Yes Burnadette Pop, MD  valsartan-hydrochlorothiazide (DIOVAN-HCT) 160-25 MG tablet Take 1 tablet by mouth daily. 09/02/23  Yes [provider]    Physical Exam: Vitals:   09/11/23 1300 09/11/23 1345 09/11/23 1518 09/11/23 1521  BP: 113/85 120/80 137/64   Pulse: (!)  48 83 (!) 49   Resp: 16 19 20    Temp:   98.4 F (36.9 C)   TempSrc:   Oral   SpO2: 93% 90% 99%   Weight:    55.7 kg  Height:   5\' 5"  (1.651 m)     Constitutional: Elderly female who appears to be in no acute distress Eyes: PERRL, lids and conjunctivae normal ENMT: Mucous membranes are dry.  Very hard of hearing hearing aids not in place. Neck: normal, supple Respiratory: clear to auscultation bilaterally, no wheezing, no crackles. Normal respiratory effort. No accessory muscle use.  Cardiovascular: Bradycardic.  2+ pedal pulses. Abdomen: no tenderness, no masses palpated.Bowel sounds positive.  Musculoskeletal: no clubbing / cyanosis. No joint deformity upper and lower extremities. Good ROM, no contractures. Normal muscle tone.  Skin: no rashes, lesions, ulcers.  Skin tenting appreciated. Neurologic: CN 2-12 grossly intact.  Strength 4/5 Psychiatric: Normal judgment and insight. Alert and oriented x 3. Normal mood.   Data Reviewed:  Reviewed labs, imaging, and pertinent records as documented  Assessment and Plan: Tremors and Weakness New onset.  Patient reports recent tremor and increased weakness to the point which she is unable to get around with use of her cane/walker as she previously could.  Possibly related to recent medication changes including initiation of Celexa. No chest pain reported. -Admit to a medical telemetry bed -Check TSH -Correct electrolyte abnormalities -Held Celexa as tremor is a known side effect -Physical and occupational therapy consultation for mobility assessment and improvement.  Acute kidney injury superimposed on chronic Kidney disease stage IV Creatinine noted to be elevated up to 2.69 with BUN 48.  Baseline creatinine previously noted to be around 1.6-1.9.  Decreased kidney function noted, possibly due to dehydration. Chronic condition, no history of dialysis. -Administer IV fluids for rehydration. -Monitor kidney  function.  Hypocalcemia Hyperkalemia Mildly elevated potassium 5.3 and low calcium levels of 8.2 detected, which may contribute to muscle aches and tremors. -Correct electrolyte imbalances with appropriate supplementation.  Bradycardia Heart rates currently 48-83.  Blood pressures otherwise maintained.  Patient is not on any rate controlling medicines. -Follow-up telemetry overnight  Normocytic anemia Chronic.  Hemoglobin 11.4 and appears similar to prior. -Continue to monitor  Essential hypertension Home blood pressure regimen includes amlodipine 10 mg daily and losartan hydrochlorothiazide 160-25 mg daily. -Hold losartan-hydrochlorothiazide as patient to be dehydrated with worsening kidney function and family reporting decreased p.o. intake -Continue amlodipine as tolerated   Depression Insomnia Patient had just recently been started on Celexa and one of the side effects is tremor. -Held Celexa due to tremor -Continue trazodone  DVT prophylaxis: Lovenox Advance Care Planning:   Code Status: Full Code.  Consults: None Family Communication: Discussed with patient's POA over the phone  Severity of Illness:  The appropriate patient status for this patient is OBSERVATION. Observation status is judged to be reasonable and necessary in order to provide the required intensity of service to ensure the patient's safety. The patient's presenting symptoms, physical exam findings, and initial radiographic and laboratory data in the context of their medical condition is felt to place them at  decreased risk for further clinical deterioration. Furthermore, it is anticipated that the patient will be medically stable for discharge from the hospital within 2 midnights of admission.  Author: Clydie Braun, MD 09/11/2023 5:58 PM  For on call review [email or web address].

## 2023-09-11 NOTE — Evaluation (Signed)
Physical Therapy Evaluation Patient Details Name: Diane Skinner MRN: [id number] DOB: [birth year] Today's Date: 09/11/2023  History of Present Illness  89 y.o. female presents to [facility] hospital on 09/11/2023 with weakness and shakes. Lab values concerning for AKI. PMH includes HTN, CKD III, OA.  Clinical Impression  Pt presents to PT with deficits in strength, power, endurance, gait, balance, cognition. Pt appears to be an unreliable historian, having difficulty reporting which level of care she is in at [facility] or what assistance is available to her. Pt is currently able to ambulate with support of rollator for short household distances, however when going to the bathroom pt abandons her rollator and then requires assistance to maintain balance. Pt does continue to demonstrate intermittent BUE and trunk tremors. Pt will benefit from frequent mobilization with staff assistance in an effort to improve activity tolerance and stability. PT recommends inpatient PT services at the time of discharge due to unclear caregiver support. If pt has frequent assistance for out of bed mobility then the pt may be able to discharge home with HHPT.        If plan is discharge home, recommend the following: A little help with walking and/or transfers;A lot of help with bathing/dressing/bathroom;Assistance with cooking/housework;Direct supervision/assist for medications management;Direct supervision/assist for financial management;Assist for transportation;Help with stairs or ramp for entrance   Can travel by private vehicle   Yes    Equipment Recommendations None recommended by PT  Recommendations for Other Services       Functional Status Assessment Patient has had a recent decline in their functional status and demonstrates the ability to make significant improvements in function in a reasonable and predictable amount of time.     Precautions / Restrictions Precautions Precautions:  Fall Restrictions Weight Bearing Restrictions Per Provider Order: No      Mobility  Bed Mobility Overal bed mobility: Needs Assistance Bed Mobility: Supine to Sit, Sit to Supine     Supine to sit: Supervision Sit to supine: Min assist        Transfers Overall transfer level: Needs assistance Equipment used: Rollator (4 wheels) Transfers: Sit to/from Stand Sit to Stand: Contact guard assist                Ambulation/Gait Ambulation/Gait assistance: Contact guard assist, Min assist Gait Distance (Feet): 100 Feet (additional 10' with hand hold requiring minA) Assistive device: Rollator (4 wheels), 1 person hand held assist Gait Pattern/deviations: Step-to pattern Gait velocity: reduced Gait velocity interpretation: <1.8 ft/sec, indicate of risk for recurrent falls   General Gait Details: pt with slowed step-to gait, fair balance with support of rollator. when heading into bathroom pt abandons rollator and requires minA when without BUE support  Stairs            Wheelchair Mobility     Tilt Bed    Modified Rankin (Stroke Patients Only)       Balance Overall balance assessment: Needs assistance Sitting-balance support: No upper extremity supported, Feet supported Sitting balance-Leahy Scale: Good     Standing balance support: Bilateral upper extremity supported, Reliant on assistive device for balance Standing balance-Leahy Scale: Poor                               Pertinent Vitals/Pain Pain Assessment Pain Assessment: No/denies pain    Home Living Family/patient expects to be discharged to:: Other (Comment)  Additional Comments: Pt resides at [facility], she is unable to report what level of care she receives there, whether ILF, ALF or memory care    Prior Function Prior Level of Function : Needs assist;Patient poor historian/Family not available             Mobility Comments: pt reports  ambulating with use of a rollator ADLs Comments: pt reports she does most things for herself other than cook meal, she has a hard time recalling if anyone assists her with her medications     Extremity/Trunk Assessment   Upper Extremity Assessment Upper Extremity Assessment: Generalized weakness (pt with intermittent BUE tremors and coordination impairment)    Lower Extremity Assessment Lower Extremity Assessment: Generalized weakness    Cervical / Trunk Assessment Cervical / Trunk Assessment: Kyphotic  Communication   Communication Communication: No apparent difficulties Cueing Techniques: Verbal cues  Cognition Arousal: Alert Behavior During Therapy: WFL for tasks assessed/performed Overall Cognitive Status: No family/caregiver present to determine baseline cognitive functioning                                 General Comments: pt is alert and oriented to person only. Pt appears to have some short term memory deficits at this time, with poor ability to describer her PLOF or available caregiver support        General Comments General comments (skin integrity, edema, etc.): VSS on RA, HR in 50s during activity    Exercises     Assessment/Plan    PT Assessment Patient needs continued PT services  PT Problem List Decreased strength;Decreased activity tolerance;Decreased mobility;Decreased balance;Decreased knowledge of use of DME;Decreased cognition;Decreased safety awareness;Decreased knowledge of precautions       PT Treatment Interventions DME instruction;Gait training;Functional mobility training;Therapeutic activities;Therapeutic exercise;Balance training;Neuromuscular re-education;Cognitive remediation;Patient/family education    PT Goals (Current goals can be found in the Care Plan section)  Acute Rehab PT Goals Patient Stated Goal: to improve strength, reduce falls risk PT Goal Formulation: With patient Time For Goal Achievement: 09/25/23 Potential  to Achieve Goals: Fair    Frequency Min 1X/week     Co-evaluation               AM-PAC PT "6 Clicks" Mobility  Outcome Measure Help needed turning from your back to your side while in a flat bed without using bedrails?: A Little Help needed moving from lying on your back to sitting on the side of a flat bed without using bedrails?: A Little Help needed moving to and from a bed to a chair (including a wheelchair)?: A Little Help needed standing up from a chair using your arms (e.g., wheelchair or bedside chair)?: A Little Help needed to walk in hospital room?: A Little Help needed climbing 3-5 steps with a railing? : Total 6 Click Score: 16    End of Session Equipment Utilized During Treatment: Gait belt Activity Tolerance: Patient tolerated treatment well Patient left: in bed;with call bell/phone within reach;with bed alarm set Nurse Communication: Mobility status PT Visit Diagnosis: Other abnormalities of gait and mobility (R26.89);Muscle weakness (generalized) (M62.81)    Time: 1027-2536 PT Time Calculation (min) (ACUTE ONLY): 25 min   Charges:   PT Evaluation $PT Eval Low Complexity: 1 Low   PT General Charges $$ ACUTE PT VISIT: 1 Visit         Arlyss Gandy, PT, DPT Acute Rehabilitation Office [phone]   Wyvonnia Dusky  Nedra Hai 09/11/2023, 5:08 PM

## 2023-09-11 NOTE — Plan of Care (Signed)
Patient is a new admit from  the ED. Patient comes from a nursing  home . Patient  is alert and oreinted x 4. On room air, patient denies chest pain, chest pressure or sob, afebrile. Patient in no acute distress.  Safety precaution initiated, bed lock in lowest position, oriented to call bell, verbalized understanding.   Problem: Education: Goal: Knowledge of General Education information will improve Description: Including pain rating scale, medication(s)/side effects and non-pharmacologic comfort measures Outcome: Progressing   Problem: Health Behavior/Discharge Planning: Goal: Ability to manage health-related needs will improve Outcome: Progressing   Problem: Clinical Measurements: Goal: Ability to maintain clinical measurements within normal limits will improve Outcome: Progressing Goal: Will remain free from infection Outcome: Progressing Goal: Diagnostic test results will improve Outcome: Progressing Goal: Respiratory complications will improve Outcome: Progressing Goal: Cardiovascular complication will be avoided Outcome: Progressing   Problem: Activity: Goal: Risk for activity intolerance will decrease Outcome: Progressing   Problem: Nutrition: Goal: Adequate nutrition will be maintained Outcome: Progressing   Problem: Coping: Goal: Level of anxiety will decrease Outcome: Progressing   Problem: Elimination: Goal: Will not experience complications related to bowel motility Outcome: Progressing Goal: Will not experience complications related to urinary retention Outcome: Progressing   Problem: Pain Management: Goal: General experience of comfort will improve Outcome: Progressing   Problem: Safety: Goal: Ability to remain free from injury will improve Outcome: Progressing   Problem: Skin Integrity: Goal: Risk for impaired skin integrity will decrease Outcome: Progressing

## 2023-09-11 NOTE — Plan of Care (Signed)

## 2023-09-11 NOTE — ED Provider Notes (Signed)
[place] EMERGENCY DEPARTMENT AT [facility] Provider Note   CSN: [id number] Arrival date & time: 09/11/23  4098     History Chief Complaint  Patient presents with   Weakness    Diane Skinner is a 89 y.o. female with h/o thrombocytopenia, HTN, bradycardia presents to the ER for evaluation of generalized weakness over the past few days. She reports that she feels too shaky to walk. She reports that she has felt like this for the past few days.  Denies any focal weakness.  She denies any chest pain, shortness of breath, abdominal pain, nausea, vomit, dysuria, hematuria, fever, cough.  She denies any recent cough or cold symptoms.  Reports that she has been eating and drinking well yesterday but has not had any thing to eat or drink today.  She denies any syncopal episodes or any lightheadedness or dizziness.  She denies any weakness on one side of her body.     Weakness Associated symptoms: no abdominal pain, no chest pain, no cough, no dysuria, no fever, no headaches, no nausea, no shortness of breath and no vomiting        Home Medications Prior to Admission medications   Medication Sig Start Date End Date Taking? Authorizing Provider  citalopram (CELEXA) 20 MG tablet Take 20 mg by mouth daily. 08/27/23  Yes [provider]  valsartan-hydrochlorothiazide (DIOVAN-HCT) 160-25 MG tablet Take 1 tablet by mouth daily. 09/02/23  Yes [provider]  amLODipine (NORVASC) 10 MG tablet TAKE 1 TABLET BY MOUTH EVERY DAY 02/25/22  Yes Ellison Carwin, MD  Cyanocobalamin (VITAMIN B12 PO) Take by mouth at bedtime.   Yes [provider]  gabapentin (NEURONTIN) 300 MG capsule Take 1 capsule (300 mg total) by mouth at bedtime. Patient taking differently: Take 600 mg by mouth at bedtime. 12/10/22  Yes Burnadette Pop, MD  Multiple Vitamins-Minerals (THERA-M PO) Take by mouth daily.   Yes [provider]  traZODone (DESYREL) 50 MG tablet Take 1 tablet (50  mg total) by mouth at bedtime. 12/10/22  Yes Burnadette Pop, MD      Allergies    Ibuprofen    Review of Systems   Review of Systems  Constitutional:  Negative for chills and fever.  HENT:  Negative for congestion and rhinorrhea.   Respiratory:  Negative for cough and shortness of breath.   Cardiovascular:  Negative for chest pain.  Gastrointestinal:  Negative for abdominal pain, nausea and vomiting.  Genitourinary:  Negative for dysuria and hematuria.  Neurological:  Positive for weakness. Negative for syncope and headaches.    Physical Exam Updated Vital Signs BP (!) 127/54   Pulse (!) 48   Temp 98.3 F (36.8 C) (Temporal)   Resp 17   Ht 5\' 5"  (1.651 m)   Wt 59 kg   SpO2 99%   BMI 21.63 kg/m  Physical Exam Vitals and nursing note reviewed.  Constitutional:      General: She is not in acute distress.    Appearance: She is not ill-appearing or toxic-appearing.  HENT:     Mouth/Throat:     Mouth: Mucous membranes are dry.  Cardiovascular:     Rate and Rhythm: Bradycardia present.     Pulses: Normal pulses.  Pulmonary:     Effort: Pulmonary effort is normal. No respiratory distress.  Abdominal:     Palpations: Abdomen is soft.     Tenderness: There is no abdominal tenderness. There is no guarding or rebound.  Musculoskeletal:  Right lower leg: No edema.     Left lower leg: No edema.  Skin:    General: Skin is warm and dry.  Neurological:     Mental Status: She is alert and oriented to person, place, and time. Mental status is at baseline.     Sensory: No sensory deficit.     Motor: Weakness present.     Comments: Generalized weakness in the upper and lower bilateral extremities.  No focal deficit noted.  Sensation reportedly intact and symmetric in the lower extremities per patient.  She is oriented x 4.     ED Results / Procedures / Treatments   Labs (all labs ordered are listed, but only abnormal results are displayed) Labs Reviewed  CBC WITH  DIFFERENTIAL/PLATELET - Abnormal; Notable for the following components:      Result Value   Hemoglobin 11.4 (*)    MCH 25.6 (*)    Platelets 143 (*)    All other components within normal limits  COMPREHENSIVE METABOLIC PANEL - Abnormal; Notable for the following components:   Sodium 134 (*)    Potassium 5.3 (*)    CO2 19 (*)    BUN 48 (*)    Creatinine, Ser 2.69 (*)    Calcium 8.2 (*)    Total Protein 6.4 (*)    Albumin 3.3 (*)    GFR, Estimated 16 (*)    All other components within normal limits  RESP PANEL BY RT-PCR (RSV, FLU A&B, COVID)  RVPGX2  MAGNESIUM  URINALYSIS, ROUTINE W REFLEX MICROSCOPIC  TROPONIN I (HIGH SENSITIVITY)  TROPONIN I (HIGH SENSITIVITY)    EKG EKG Interpretation Date/Time:  Friday September 11 2023 09:51:16 EST Ventricular Rate:  53 PR Interval:  172 QRS Duration:  91 QT Interval:  457 QTC Calculation: 430 R Axis:   -34  Text Interpretation: Sinus rhythm Left axis deviation Low voltage, precordial leads No significant change since last tracing Confirmed by Linwood Dibbles [phone]) on 09/11/2023 9:57:35 AM  Radiology No results found.  Procedures Procedures   Medications Ordered in ED Medications  sodium chloride 0.9 % bolus 500 mL (500 mLs Intravenous New Bag/Given 09/11/23 1125)    ED Course/ Medical Decision Making/ A&P    Medical Decision Making Amount and/or Complexity of Data Reviewed Labs: ordered. Radiology: ordered.  Risk Decision regarding hospitalization.   89 y.o. female presents to the ER for evaluation of generalized weakness. Differential diagnosis includes but is not limited to electrolyte abnormality, dehydration, advanced age. Vital signs show mildly. Physical exam as noted above.   On previous chart evaluation, I see that the patient's had bradycardia before.  This is not a new finding.  I independently reviewed and interpreted the patient's labs.  CBC shows mild anemia 11.4 and mildly decreased platelets at 143 however  appears to be chronic.  Magnesium within normal limits.  Troponin at 14.  CMP shows sodium 134.  Potassium mildly elevated 5.3.  Bicarb at 19 with a anion gap within normal limits.  BUN appears to be chronically elevated however patient does have a new AKI with a creatinine of 2.69 with new in comparison at 1.9.  Mildly decreased calcium, total protein, and albumin.  No other electrolyte or LFT abnormalities.  COVID, flu, RSV negative.  Urinalysis still needs to be collected.  Bladder scan revealed 260 cc of urine.  I likely think the elevated potassium is minor and likely due to the patient's AKI.  Will need to be treated with fluids and  recheck.  The patient does not have any focal deficits.  She is alert and oriented x 4.  She does have some generalized weakness throughout however there is no focal weakness I appreciate.  Patient will need to be admitted for AKI.  [facility] hospitalist to admit.   Portions of this report may have been transcribed using voice recognition software. Every effort was made to ensure accuracy; however, inadvertent computerized transcription errors may be present.   Final Clinical Impression(s) / ED Diagnoses Final diagnoses:  AKI (acute kidney injury) (HCC)  Generalized weakness  Bradycardia    Rx / DC Orders ED Discharge Orders     None         Achille Rich, PA-C 09/11/23 1642    Linwood Dibbles, MD 09/14/23 [phone]

## 2023-09-11 NOTE — ED Notes (Signed)
ED TO INPATIENT HANDOFF REPORT  ED Nurse Name and Phone #: [phone], Tori   S Name/Age/Gender Diane Skinner 89 y.o. female Room/Bed: 032C/032C  Code Status   Code Status: Full Code  Home/SNF/Other Nursing Home Patient oriented to: self, place, time, and situation Is this baseline? Yes   Triage Complete: Triage complete  Chief Complaint AKI (acute kidney injury) (HCC) [N17.9]  Triage Note Pt BIB EMS from [facility] for weakness and shaking for 2 days. Usually independent with walker and has had increased weakness. axox4   Allergies Allergies  Allergen Reactions   Ibuprofen Swelling    Level of Care/Admitting Diagnosis ED Disposition     ED Disposition  Admit   Condition  --   Comment  Hospital Area: [facility] [[id number]]  Level of Care: Telemetry Medical [104]  May place patient in observation at [facility] or [facility] if equivalent level of care is available:: No  Covid Evaluation: Asymptomatic - no recent exposure (last 10 days) testing not required  Diagnosis: AKI (acute kidney injury) [facility]) [[id number]]  Admitting Physician: Clydie Braun [[id number]]  Attending Physician: Clydie Braun [[id number]]          B Medical/Surgery History Past Medical History:  Diagnosis Date   Arthritis    Hypertension    Renal disorder    Past Surgical History:  Procedure Laterality Date   BREAST SURGERY     CHOLECYSTECTOMY       A IV Location/Drains/Wounds Patient Lines/Drains/Airways Status     Active Line/Drains/Airways     Name Placement date Placement time Site Days   Peripheral IV 09/11/23 20 G Anterior;Proximal;Right Forearm 09/11/23  1000  Forearm  less than 1   Pressure Injury 12/05/22 Sacrum Stage 1 -  Intact skin with non-blanchable redness of a localized area usually over a bony prominence. 12/05/22  1752  -- 280            Intake/Output Last 24 hours No intake or output data in the 24 hours ending 09/11/23  1359  Labs/Imaging Results for orders placed or performed during the hospital encounter of 09/11/23 (from the past 48 hours)  CBC with Differential     Status: Abnormal   Collection Time: 09/11/23 10:00 AM  Result Value Ref Range   WBC 9.5 4.0 - 10.5 K/uL   RBC 4.46 3.87 - 5.11 MIL/uL   Hemoglobin 11.4 (L) 12.0 - 15.0 g/dL   HCT 21.3 08.6 - 57.8 %   MCV 83.2 80.0 - 100.0 fL   MCH 25.6 (L) 26.0 - 34.0 pg   MCHC 30.7 30.0 - 36.0 g/dL   RDW 46.9 62.9 - 52.8 %   Platelets 143 (L) 150 - 400 K/uL    Comment: REPEATED TO VERIFY   nRBC 0.0 0.0 - 0.2 %   Neutrophils Relative % 71 %   Neutro Abs 6.7 1.7 - 7.7 K/uL   Lymphocytes Relative 22 %   Lymphs Abs 2.1 0.7 - 4.0 K/uL   Monocytes Relative 7 %   Monocytes Absolute 0.6 0.1 - 1.0 K/uL   Eosinophils Relative 0 %   Eosinophils Absolute 0.0 0.0 - 0.5 K/uL   Basophils Relative 0 %   Basophils Absolute 0.0 0.0 - 0.1 K/uL   Immature Granulocytes 0 %   Abs Immature Granulocytes 0.04 0.00 - 0.07 K/uL    Comment: Performed at [facility], [address] [place], [place] [address]  Comprehensive metabolic panel  Status: Abnormal   Collection Time: 09/11/23 10:00 AM  Result Value Ref Range   Sodium 134 (L) 135 - 145 mmol/L   Potassium 5.3 (H) 3.5 - 5.1 mmol/L   Chloride 106 98 - 111 mmol/L   CO2 19 (L) 22 - 32 mmol/L   Glucose, Bld 88 70 - 99 mg/dL    Comment: Glucose reference range applies only to samples taken after fasting for at least 8 hours.   BUN 48 (H) 8 - 23 mg/dL   Creatinine, Ser 8.29 (H) 0.44 - 1.00 mg/dL   Calcium 8.2 (L) 8.9 - 10.3 mg/dL   Total Protein 6.4 (L) 6.5 - 8.1 g/dL   Albumin 3.3 (L) 3.5 - 5.0 g/dL   AST 15 15 - 41 U/L   ALT 8 0 - 44 U/L   Alkaline Phosphatase 60 38 - 126 U/L   Total Bilirubin 0.5 <1.2 mg/dL   GFR, Estimated 16 (L) >60 mL/min    Comment: (NOTE) Calculated using the CKD-EPI Creatinine Equation (2021)    Anion gap 9 5 - 15    Comment: Performed at [facility], [address], [place], [place] [address]  Magnesium     Status: None   Collection Time: 09/11/23 10:00 AM  Result Value Ref Range   Magnesium 2.4 1.7 - 2.4 mg/dL    Comment: Performed at [facility], [address] [place], [place] [address]  Resp panel by RT-PCR (RSV, Flu A&B, Covid) Anterior Nasal Swab     Status: None   Collection Time: 09/11/23 10:00 AM   Specimen: Anterior Nasal Swab  Result Value Ref Range   SARS Coronavirus 2 by RT PCR NEGATIVE NEGATIVE   Influenza A by PCR NEGATIVE NEGATIVE   Influenza B by PCR NEGATIVE NEGATIVE    Comment: (NOTE) The Xpert Xpress SARS-CoV-2/FLU/RSV plus assay is intended as an aid in the diagnosis of influenza from Nasopharyngeal swab specimens and should not be used as a sole basis for treatment. Nasal washings and aspirates are unacceptable for Xpert Xpress SARS-CoV-2/FLU/RSV testing.  Fact Sheet for Patients: BloggerCourse.com  Fact Sheet for Healthcare Providers: SeriousBroker.it  This test is not yet approved or cleared by the [place] FDA and has been authorized for detection and/or diagnosis of SARS-CoV-2 by FDA under an Emergency Use Authorization (EUA). This EUA will remain in effect (meaning this test can be used) for the duration of the COVID-19 declaration under Section 564(b)(1) of the Act, 21 U.S.C. section 360bbb-3(b)(1), unless the authorization is terminated or revoked.     Resp Syncytial Virus by PCR NEGATIVE NEGATIVE    Comment: (NOTE) Fact Sheet for Patients: BloggerCourse.com  Fact Sheet for Healthcare Providers: SeriousBroker.it  This test is not yet approved or cleared by the [place] FDA and has been authorized for detection and/or diagnosis of SARS-CoV-2 by FDA under an Emergency Use Authorization (EUA). This EUA will remain in effect (meaning this test can be used) for the duration of the COVID-19  declaration under Section 564(b)(1) of the Act, 21 U.S.C. section 360bbb-3(b)(1), unless the authorization is terminated or revoked.  Performed at [facility], [address], [place], [place] [address]   Troponin I (High Sensitivity)     Status: None   Collection Time: 09/11/23 10:00 AM  Result Value Ref Range   Troponin I (High Sensitivity) 14 <18 ng/L    Comment: (NOTE) Elevated high sensitivity troponin I (hsTnI) values and significant  changes across serial  measurements may suggest ACS but many other  chronic and acute conditions are known to elevate hsTnI results.  Refer to the "Links" section for chest pain algorithms and additional  guidance. Performed at [facility], [address] [place], [place] [address]   Troponin I (High Sensitivity)     Status: None   Collection Time: 09/11/23 11:51 AM  Result Value Ref Range   Troponin I (High Sensitivity) 13 <18 ng/L    Comment: (NOTE) Elevated high sensitivity troponin I (hsTnI) values and significant  changes across serial measurements may suggest ACS but many other  chronic and acute conditions are known to elevate hsTnI results.  Refer to the "Links" section for chest pain algorithms and additional  guidance. Performed at [facility], [address] [place], [place] [address]    DG Pelvis 1-2 Views Result Date: 09/11/2023 CLINICAL DATA:  Weakness.  No reported history of trauma EXAM: PELVIS - 1 VIEW COMPARISON:  X-ray 12/05/2022. FINDINGS: Severe osteopenia. Slight joint space loss of the hips. Mild degenerative changes of the sacroiliac joints as well. No fracture or dislocation. Degenerative changes along the lower lumbar spine. Overlapping cardiac leads. IMPRESSION: Osteopenia.  Degenerative changes. Electronically Signed   By: Karen Kays M.D.   On: 09/11/2023 11:45   DG Chest 2 View Result Date: 09/11/2023 CLINICAL DATA:  Weakness EXAM: CHEST - 2 VIEW COMPARISON:  X-ray 12/05/2022. FINDINGS:  Enlarged cardiopericardial silhouette. Tortuous and ectatic aorta. Vascular congestion. No pneumothorax or effusion. No edema. Overlapping cardiac leads. Curvature and degenerative changes along the spine. IMPRESSION: Enlarged heart with some vascular congestion. Tortuous ectatic aorta. Curvature and degenerative changes along the spine. Electronically Signed   By: Karen Kays M.D.   On: 09/11/2023 11:44    Pending Labs Unresulted Labs (From admission, onward)     Start     Ordered   09/12/23 0500  CBC  Tomorrow morning,   R        09/11/23 1336   09/12/23 0500  Basic metabolic panel  Tomorrow morning,   R        09/11/23 1336   09/11/23 1337  Urinalysis, w/ Reflex to Culture (Infection Suspected) -Urine, Clean Catch  (Urine Labs)  Once,   R       Question:  Specimen Source  Answer:  Urine, Clean Catch   09/11/23 1336   09/11/23 1334  CK  Add-on,   AD        09/11/23 1336   09/11/23 0950  Urinalysis, Routine w reflex microscopic -Urine, Clean Catch  Once,   URGENT       Question:  Specimen Source  Answer:  Urine, Clean Catch   09/11/23 0950            Vitals/Pain Today's Vitals   09/11/23 1126 09/11/23 1215 09/11/23 1300 09/11/23 1345  BP:  124/62 113/85 120/80  Pulse:  (!) 51 (!) 48 83  Resp:  19 16 19   Temp:      TempSrc:      SpO2:  99% 93% 90%  Weight:      Height:      PainSc: 0-No pain       Isolation Precautions Airborne and Contact precautions  Medications Medications  enoxaparin (LOVENOX) injection 30 mg (has no administration in time range)  sodium chloride flush (NS) 0.9 % injection 3 mL (3 mLs Intravenous Not Given 09/11/23 1350)  acetaminophen (TYLENOL) tablet 650 mg (has no administration in time  range)    Or  acetaminophen (TYLENOL) suppository 650 mg (has no administration in time range)  albuterol (PROVENTIL) (2.5 MG/3ML) 0.083% nebulizer solution 2.5 mg (has no administration in time range)  sodium chloride 0.9 % bolus 500 mL (0 mLs Intravenous  Stopped 09/11/23 1212)    Mobility walks with device     Focused Assessments Neuro Assessment Handoff:  Swallow screen pass? Yes          Neuro Assessment:   Neuro Checks:      Has TPA been given? No If patient is a Neuro Trauma and patient is going to OR before floor call report to 4N Charge nurse: [phone] or [phone]   R Recommendations: See Admitting Provider Note  Report given to:   Additional Notes: axox4, VSS

## 2023-09-12 DIAGNOSIS — R531 Weakness: Secondary | ICD-10-CM | POA: Diagnosis present

## 2023-09-12 DIAGNOSIS — M1990 Unspecified osteoarthritis, unspecified site: Secondary | ICD-10-CM | POA: Diagnosis present

## 2023-09-12 DIAGNOSIS — Z8249 Family history of ischemic heart disease and other diseases of the circulatory system: Secondary | ICD-10-CM | POA: Diagnosis not present

## 2023-09-12 DIAGNOSIS — E860 Dehydration: Secondary | ICD-10-CM | POA: Diagnosis present

## 2023-09-12 DIAGNOSIS — Z79899 Other long term (current) drug therapy: Secondary | ICD-10-CM | POA: Diagnosis not present

## 2023-09-12 DIAGNOSIS — F02818 Dementia in other diseases classified elsewhere, unspecified severity, with other behavioral disturbance: Secondary | ICD-10-CM | POA: Diagnosis present

## 2023-09-12 DIAGNOSIS — E875 Hyperkalemia: Secondary | ICD-10-CM | POA: Diagnosis present

## 2023-09-12 DIAGNOSIS — D631 Anemia in chronic kidney disease: Secondary | ICD-10-CM | POA: Diagnosis present

## 2023-09-12 DIAGNOSIS — R251 Tremor, unspecified: Secondary | ICD-10-CM | POA: Diagnosis not present

## 2023-09-12 DIAGNOSIS — G309 Alzheimer's disease, unspecified: Secondary | ICD-10-CM

## 2023-09-12 DIAGNOSIS — I77819 Aortic ectasia, unspecified site: Secondary | ICD-10-CM | POA: Diagnosis present

## 2023-09-12 DIAGNOSIS — Z9049 Acquired absence of other specified parts of digestive tract: Secondary | ICD-10-CM | POA: Diagnosis not present

## 2023-09-12 DIAGNOSIS — N184 Chronic kidney disease, stage 4 (severe): Secondary | ICD-10-CM | POA: Diagnosis present

## 2023-09-12 DIAGNOSIS — F32A Depression, unspecified: Secondary | ICD-10-CM | POA: Diagnosis present

## 2023-09-12 DIAGNOSIS — G251 Drug-induced tremor: Secondary | ICD-10-CM | POA: Diagnosis present

## 2023-09-12 DIAGNOSIS — I10 Essential (primary) hypertension: Secondary | ICD-10-CM | POA: Diagnosis not present

## 2023-09-12 DIAGNOSIS — F0283 Dementia in other diseases classified elsewhere, unspecified severity, with mood disturbance: Secondary | ICD-10-CM | POA: Diagnosis present

## 2023-09-12 DIAGNOSIS — Z886 Allergy status to analgesic agent status: Secondary | ICD-10-CM | POA: Diagnosis not present

## 2023-09-12 DIAGNOSIS — R001 Bradycardia, unspecified: Secondary | ICD-10-CM | POA: Diagnosis present

## 2023-09-12 DIAGNOSIS — I1310 Hypertensive heart and chronic kidney disease without heart failure, with stage 1 through stage 4 chronic kidney disease, or unspecified chronic kidney disease: Secondary | ICD-10-CM | POA: Diagnosis present

## 2023-09-12 DIAGNOSIS — N179 Acute kidney failure, unspecified: Secondary | ICD-10-CM | POA: Diagnosis present

## 2023-09-12 DIAGNOSIS — F0280 Dementia in other diseases classified elsewhere without behavioral disturbance: Secondary | ICD-10-CM

## 2023-09-12 DIAGNOSIS — T43225A Adverse effect of selective serotonin reuptake inhibitors, initial encounter: Secondary | ICD-10-CM | POA: Diagnosis present

## 2023-09-12 DIAGNOSIS — F5105 Insomnia due to other mental disorder: Secondary | ICD-10-CM | POA: Diagnosis present

## 2023-09-12 DIAGNOSIS — M8580 Other specified disorders of bone density and structure, unspecified site: Secondary | ICD-10-CM | POA: Diagnosis present

## 2023-09-12 LAB — CBC
HCT: 37.7 % (ref 36.0–46.0)
Hemoglobin: 11.4 g/dL — ABNORMAL LOW (ref 12.0–15.0)
MCH: 25.4 pg — ABNORMAL LOW (ref 26.0–34.0)
MCHC: 30.2 g/dL (ref 30.0–36.0)
MCV: 84 fL (ref 80.0–100.0)
Platelets: 143 10*3/uL — ABNORMAL LOW (ref 150–400)
RBC: 4.49 MIL/uL (ref 3.87–5.11)
RDW: 14.6 % (ref 11.5–15.5)
WBC: 7.8 10*3/uL (ref 4.0–10.5)
nRBC: 0 % (ref 0.0–0.2)

## 2023-09-12 LAB — BASIC METABOLIC PANEL
Anion gap: 6 (ref 5–15)
BUN: 47 mg/dL — ABNORMAL HIGH (ref 8–23)
CO2: 19 mmol/L — ABNORMAL LOW (ref 22–32)
Calcium: 8.4 mg/dL — ABNORMAL LOW (ref 8.9–10.3)
Chloride: 116 mmol/L — ABNORMAL HIGH (ref 98–111)
Creatinine, Ser: 2.32 mg/dL — ABNORMAL HIGH (ref 0.44–1.00)
GFR, Estimated: 19 mL/min — ABNORMAL LOW (ref >60)
Glucose, Bld: 90 mg/dL (ref 70–99)
Potassium: 5.7 mmol/L — ABNORMAL HIGH (ref 3.5–5.1)
Sodium: 141 mmol/L (ref 135–145)

## 2023-09-12 MED ORDER — GUAIFENESIN-DM 100-10 MG/5ML PO SYRP: 5.0000 mL | ORAL_SOLUTION | ORAL | Status: DC | PRN

## 2023-09-12 MED ORDER — SODIUM BICARBONATE 650 MG PO TABS
650.0000 mg | ORAL_TABLET | Freq: Two times a day (BID) | ORAL | Status: DC
  Administered 2023-09-12 – 2023-09-14 (×5): 650 mg via ORAL
  Filled 2023-09-12 (×5): qty 1

## 2023-09-12 MED ORDER — SODIUM ZIRCONIUM CYCLOSILICATE 10 G PO PACK
10.0000 g | PACK | Freq: Once | ORAL | Status: AC
  Administered 2023-09-12: 10 g via ORAL
  Filled 2023-09-12: qty 1

## 2023-09-12 NOTE — Subjective & Objective (Signed)
Pt seen and examined. Stable. I had talked to pt's niece India yesterday via phone.  Plan for DC back to independent living tomorrow.  AKI has improved. Scr back to 2.09. baseline around 2.0  Pt walked 150 feet today with RW.  CM aware of DC back to independent living tomorrow. Pt will need ambulance at discharge.

## 2023-09-12 NOTE — Evaluation (Signed)
Occupational Therapy Evaluation Patient Details Name: Diane Skinner MRN: [id number] DOB: [birth year] Today's Date: 09/12/2023   History of Present Illness 89 y.o. female presents to [facility] hospital on 09/11/2023 with weakness and shakes. Lab values concerning for AKI. PMH includes HTN, CKD III, OA.   Clinical Impression   Pt questionable historian, reports she is ind with ADL, though has assist for IADLs, ambulates with rollator. Per chart reivew, pt from [facility]. Pt needing set up - mod A for ADLs, supervision - CGA for bed mobility and CGA for transfers with RW. Pt HOH, needs incr cues/repetition for task sequencing. Pt presenting with impairments listed below, will follow acutely. Patient will benefit from continued inpatient follow up therapy, <3 hours/day to maximize safety/ind with ADL/functional mobility.        If plan is discharge home, recommend the following: A little help with walking and/or transfers;A lot of help with bathing/dressing/bathroom;Assistance with cooking/housework;Direct supervision/assist for medications management;Direct supervision/assist for financial management;Assist for transportation;Help with stairs or ramp for entrance;Supervision due to cognitive status    Functional Status Assessment  Patient has had a recent decline in their functional status and demonstrates the ability to make significant improvements in function in a reasonable and predictable amount of time.  Equipment Recommendations  Other (comment) (defer)    Recommendations for Other Services PT consult     Precautions / Restrictions Precautions Precautions: Fall Restrictions Weight Bearing Restrictions Per Provider Order: No      Mobility Bed Mobility Overal bed mobility: Needs Assistance Bed Mobility: Supine to Sit, Sit to Supine     Supine to sit: Supervision Sit to supine: Contact guard assist        Transfers Overall transfer level: Needs assistance Equipment  used: Rolling walker (2 wheels) Transfers: Sit to/from Stand Sit to Stand: Contact guard assist                  Balance Overall balance assessment: Needs assistance Sitting-balance support: No upper extremity supported, Feet supported Sitting balance-Leahy Scale: Good     Standing balance support: Bilateral upper extremity supported, Reliant on assistive device for balance Standing balance-Leahy Scale: Poor                             ADL either performed or assessed with clinical judgement   ADL Overall ADL's : Needs assistance/impaired Eating/Feeding: Set up;Sitting   Grooming: Minimal assistance;Standing   Upper Body Bathing: Moderate assistance;Standing   Lower Body Bathing: Moderate assistance;Sitting/lateral leans   Upper Body Dressing : Minimal assistance;Sitting   Lower Body Dressing: Moderate assistance;Sitting/lateral leans   Toilet Transfer: Contact guard assist;Ambulation;Rolling walker (2 wheels);Regular Toilet   Toileting- Clothing Manipulation and Hygiene: Contact guard assist       Functional mobility during ADLs: Contact guard assist;Rolling walker (2 wheels)       Vision   Vision Assessment?: No apparent visual deficits     Perception Perception: Not tested       Praxis Praxis: Not tested       Pertinent Vitals/Pain Pain Assessment Pain Assessment: No/denies pain     Extremity/Trunk Assessment Upper Extremity Assessment Upper Extremity Assessment: Generalized weakness (decr FMC, tremoring movements)   Lower Extremity Assessment Lower Extremity Assessment: Defer to PT evaluation   Cervical / Trunk Assessment Cervical / Trunk Assessment: Kyphotic   Communication Communication Communication: No apparent difficulties   Cognition Arousal: Alert Behavior During Therapy: WFL for tasks assessed/performed Overall Cognitive  Status: No family/caregiver present to determine baseline cognitive functioning                                  General Comments: pt is alert and oriented to person only. Pt appears to have some short term memory deficits at this time, with poor ability to describer her PLOF or available caregiver support. Also HOH which may be affecting cognition. Aware she is at the hospital for "shaking"     General Comments  VSS    Exercises     Shoulder Instructions      Home Living                                   Additional Comments: Pt resides at [facility], she is unable to report what level of care she receives there, whether ILF, ALF or memory care      Prior Functioning/Environment Prior Level of Function : Needs assist;Patient poor historian/Family not available  Cognitive Assist : Mobility (cognitive)           Mobility Comments: pt reports ambulating with use of a rollator ADLs Comments: pt reports she does most things for herself other than cook meal, she has a hard time recalling if anyone assists her with her medications        OT Problem List: Decreased strength;Decreased range of motion;Decreased activity tolerance      OT Treatment/Interventions: Self-care/ADL training;Therapeutic exercise;Energy conservation;DME and/or AE instruction;Therapeutic activities;Patient/family education;Balance training;Cognitive remediation/compensation    OT Goals(Current goals can be found in the care plan section) Acute Rehab OT Goals Patient Stated Goal: none stated OT Goal Formulation: With patient Time For Goal Achievement: 09/26/23 Potential to Achieve Goals: Good ADL Goals Pt Will Perform Upper Body Dressing: with contact guard assist Pt Will Perform Lower Body Dressing: with contact guard assist;sit to/from stand;sitting/lateral leans Pt Will Transfer to Toilet: ambulating;regular height toilet;with supervision Pt Will Perform Tub/Shower Transfer: Tub transfer;Shower transfer;with contact guard assist;ambulating;rolling walker  OT  Frequency: Min 1X/week    Co-evaluation              AM-PAC OT "6 Clicks" Daily Activity     Outcome Measure Help from another person eating meals?: A Little Help from another person taking care of personal grooming?: A Little Help from another person toileting, which includes using toliet, bedpan, or urinal?: A Little Help from another person bathing (including washing, rinsing, drying)?: A Lot Help from another person to put on and taking off regular upper body clothing?: A Little Help from another person to put on and taking off regular lower body clothing?: A Lot 6 Click Score: 16   End of Session Equipment Utilized During Treatment: Gait belt;Rolling walker (2 wheels) Nurse Communication: Mobility status (confirmed pt can eat breakfast)  Activity Tolerance: Patient tolerated treatment well Patient left: in bed;with call bell/phone within reach;with bed alarm set  OT Visit Diagnosis: Unsteadiness on feet (R26.81);Other abnormalities of gait and mobility (R26.89);Muscle weakness (generalized) (M62.81)                Time: 4098-1191 OT Time Calculation (min): 17 min Charges:  OT General Charges $OT Visit: 1 Visit OT Evaluation $OT Eval Low Complexity: 1 Low  Sourish Allender K, OTD, OTR/L SecureChat Preferred Acute Rehab [phone]   Carver Fila Koonce 09/12/2023, 10:49 AM

## 2023-09-12 NOTE — Plan of Care (Signed)

## 2023-09-12 NOTE — Assessment & Plan Note (Signed)
09-12-2023 K 5.7 today. Give 1 dose of lokelma. Continue with IVF. Pt was on Diovan-HCT as outpatient. This ARB/hydrochlorothiazide class is probably not the best choice in this 89 yo patient with known CKD stage 4. She will need alternative hypertensive medications.  09-13-2023 resolved. Pt needs to stay off ARB and hydrochlorothiazide at discharge.

## 2023-09-12 NOTE — TOC Initial Note (Addendum)
Transition of Care [facility]) - Initial/Assessment Note    Patient Details  Name: Diane Skinner MRN: [id number] Date of Birth: 1923-06-13  [facility] CM/SW Contact:    Carley Hammed, LCSW Phone Number: 09/12/2023, 10:56 AM  Clinical Narrative:                  CSW attempted to speak with pt, pt working with mobility and unable to hear well, requesting CSW speak with Niece. CSW left VM for Niece. CSW confirmed with facility that pt is from [facility] with paid caregiver supports. Pt walked 18ft CGA/MinA. CSW to speak with niece on preferred next venue when able. TOC will continue to follow.    11:45 CSW spoke with India who noted that pt has caregivers throughout the day at [facility] during the week. During the weekend, caregivers are not there as often. CSW discussed SNF vs. PLOC with Niece. Niece notes if possible, they would like to keep pt in a familiar environment. Niece notes pt would be fine during the week, but will speak with family members about extra supports for pt on the weekend. Niece notes that if additional supports cannot be put into place, they would like [facility], [facility] or [facility]. TOC to follow up with Niece to determine DC plans after discussions have been had. TOC continues to follow.  Barriers to Discharge: Other (must enter comment) (Needing family communication)   Patient Goals and CMS Choice            Expected Discharge Plan and Services In-house Referral: Clinical Social Work   Post Acute Care Choice:  (TBD) Living arrangements for the past 2 months: Independent Living Facility                                      Prior Living Arrangements/Services Living arrangements for the past 2 months: Independent Living Facility Lives with:: Facility Resident Patient language and need for interpreter reviewed:: Yes Do you feel safe going back to the place where you live?: Yes      Need for Family Participation  in Patient Care: Yes (Comment) Care giver support system in place?: Yes (comment) Current home services: Homehealth aide Criminal Activity/Legal Involvement Pertinent to Current Situation/Hospitalization: No - Comment as needed  Activities of Daily Living   ADL Screening (condition at time of admission) Independently performs ADLs?: No Does the patient have a NEW difficulty with bathing/dressing/toileting/self-feeding that is expected to last >3 days?: Yes (Initiates electronic notice to provider for possible OT consult) Does the patient have a NEW difficulty with getting in/out of bed, walking, or climbing stairs that is expected to last >3 days?: Yes (Initiates electronic notice to provider for possible PT consult) Does the patient have a NEW difficulty with communication that is expected to last >3 days?: Yes (Initiates electronic notice to provider for possible SLP consult) Is the patient deaf or have difficulty hearing?: Yes (hearing aids) Does the patient have difficulty seeing, even when wearing glasses/contacts?: No Does the patient have difficulty concentrating, remembering, or making decisions?: Yes  Permission Sought/Granted Permission sought to share information with : Family Supports Permission granted to share information with : Yes, Verbal Permission Granted  Share Information with NAME: Adair Laundry     Permission granted to share info w Relationship: Niece     Emotional Assessment Appearance:: Appears stated age Attitude/Demeanor/Rapport: Engaged Affect (typically observed): Appropriate  Orientation: : Oriented to Self, Oriented to Place, Oriented to  Time, Oriented to Situation Alcohol / Substance Use: Not Applicable Psych Involvement: No (comment)  Admission diagnosis:  Bradycardia [R00.1] Generalized weakness [R53.1] AKI (acute kidney injury) (HCC) [N17.9] Patient Active Problem List   Diagnosis Date Noted   Acute kidney injury superimposed on chronic kidney disease  (HCC) 09/11/2023   Tremor 09/11/2023   Weakness 09/11/2023   Hypocalcemia 09/11/2023   Hyperkalemia 09/11/2023   Normocytic anemia 09/11/2023   Depression 09/11/2023   COVID 12/08/2022   Pressure injury of skin 12/08/2022   COVID-19 12/05/2022   Hip pain, acute, right 12/05/2022   Respiratory failure with hypoxia (HCC) 12/05/2022   Fall at home, initial encounter 12/05/2022   Peripheral neuropathy 12/05/2022   Sacral wound, initial encounter 04/17/2021   Healthcare maintenance 04/17/2021   Chronic sinus bradycardia 04/17/2021   Thrombocytopenia (HCC) 03/15/2021   Renal disorder    Hypertension    PCP:  Renaye Rakers, MD Pharmacy:   [id number] [place], [place] - [address] [place] [address] Phone: [phone] Fax: [phone]     Social Drivers of Health (SDOH) Social History: SDOH Screenings   Food Insecurity: No Food Insecurity (09/11/2023)  Housing: Low Risk  (09/11/2023)  Transportation Needs: No Transportation Needs (09/11/2023)  Utilities: Not At Risk (09/11/2023)  Tobacco Use: Low Risk  (09/11/2023)   SDOH Interventions:     Readmission Risk Interventions     No data to display

## 2023-09-12 NOTE — Progress Notes (Signed)
Mobility Specialist Progress Note:   09/12/23 1100  Mobility  Activity Ambulated with assistance in hallway  Level of Assistance Minimal assist, patient does 75% or more  Assistive Device Front wheel walker  Distance Ambulated (ft) 110 ft  Activity Response Tolerated well  Mobility Referral Yes  Mobility visit 1 Mobility  Mobility Specialist Start Time (ACUTE ONLY) 1100  Mobility Specialist Stop Time (ACUTE ONLY) 1116  Mobility Specialist Time Calculation (min) (ACUTE ONLY) 16 min   Pt agreeable to mobility session. Required minA to stand from EOB, MinG to ambulate. No c/o throughout session. Pt back in bed with all needs met.   Diane Skinner Mobility Specialist Please contact via SecureChat or  Rehab office at [phone]

## 2023-09-12 NOTE — Assessment & Plan Note (Signed)
09-12-2023 stable. Baseline Hrs in the 50s.  Therefore betablockers and non-dihydropyridine CCB(cardizem, verapamil) not an option for HTN management.

## 2023-09-12 NOTE — Progress Notes (Signed)
   Notified by bedside Rn that pt's niece Adair Laundry wanted an update. I called Latonya @ [phone]. Gave her update. Notified niece that pt walked 110 feet with RW today. Only needs minimal assistance with walking.  Niece states that pt has caregivers M-F but only limited visits on the weekend. Would be better for patient to go home on Monday instead of Sunday.   Carollee Herter, DO [facility]

## 2023-09-12 NOTE — Care Management Obs Status (Signed)
MEDICARE OBSERVATION STATUS NOTIFICATION   Patient Details  Name: Diane Skinner MRN: [id number] Date of Birth: [birth year]   Medicare Observation Status Notification Given:  Yes    Lawerance Sabal, RN 09/12/2023, 9:40 AM

## 2023-09-12 NOTE — NC FL2 (Signed)
[place] MEDICAID FL2 LEVEL OF CARE FORM     IDENTIFICATION  Patient Name: Diane Skinner Birthdate: August 20, 1923 Sex: female Admission Date (Current Location): 09/11/2023  [facility] and [place] Number:  Producer, television/film/video and Address:  [facility], [address], [place], [place] [address]      Provider Number: [id number]  Attending Physician Name and Address:  Carollee Herter, DO  Relative Name and Phone Number:       Current Level of Care: Hospital Recommended Level of Care: Skilled Nursing Facility Prior Approval Number:    Date Approved/Denied:   PASRR Number: [id number]  Discharge Plan: SNF    Current Diagnoses: Patient Active Problem List   Diagnosis Date Noted   Acute kidney injury superimposed on chronic kidney disease (HCC) 09/11/2023   Tremor 09/11/2023   Weakness 09/11/2023   Hypocalcemia 09/11/2023   Hyperkalemia 09/11/2023   Normocytic anemia 09/11/2023   Depression 09/11/2023   COVID 12/08/2022   Pressure injury of skin 12/08/2022   COVID-19 12/05/2022   Hip pain, acute, right 12/05/2022   Respiratory failure with hypoxia (HCC) 12/05/2022   Fall at home, initial encounter 12/05/2022   Peripheral neuropathy 12/05/2022   Sacral wound, initial encounter 04/17/2021   Healthcare maintenance 04/17/2021   Chronic sinus bradycardia 04/17/2021   Thrombocytopenia (HCC) 03/15/2021   Renal disorder    Hypertension     Orientation RESPIRATION BLADDER Height & Weight     Self, Time, Situation, Place  Normal Continent Weight: 122 lb 12.7 oz (55.7 kg) Height:  5\' 5"  (165.1 cm)  BEHAVIORAL SYMPTOMS/MOOD NEUROLOGICAL BOWEL NUTRITION STATUS      Continent Diet (See DC summary)  AMBULATORY STATUS COMMUNICATION OF NEEDS Skin   Limited Assist Verbally PU Stage and Appropriate Care PU Stage 1 Dressing:  (Sac[address] St 1 PU)                     Personal Care Assistance Level of Assistance  Bathing, Feeding, Dressing Bathing  Assistance: Limited assistance Feeding assistance: Independent Dressing Assistance: Limited assistance     Functional Limitations Info  Sight, Hearing, Speech Sight Info: Adequate Hearing Info: Impaired Speech Info: Adequate    SPECIAL CARE FACTORS FREQUENCY  PT (By licensed PT), OT (By licensed OT)     PT Frequency: 5x week OT Frequency: 5x week            Contractures Contractures Info: Not present    Additional Factors Info  Code Status, Allergies Code Status Info: Full Allergies Info: Ibuprofen           Current Medications (09/12/2023):  This is the current hospital active medication list Current Facility-Administered Medications  Medication Dose Route Frequency Provider Last Rate Last Admin   0.9 %  sodium chloride infusion   Intravenous Continuous Madelyn Flavors A, MD 50 mL/hr at 09/12/23 0456 Infusion Verify at 09/12/23 0456   acetaminophen (TYLENOL) tablet 650 mg  650 mg Oral Q6H PRN Clydie Braun, MD       Or   acetaminophen (TYLENOL) suppository 650 mg  650 mg Rectal Q6H PRN Madelyn Flavors A, MD       albuterol (PROVENTIL) (2.5 MG/3ML) 0.083% nebulizer solution 2.5 mg  2.5 mg Nebulization Q6H PRN Katrinka Blazing, Rondell A, MD       amLODipine (NORVASC) tablet 10 mg  10 mg Oral Daily Elvenia Godden, Rondell A, MD   10 mg at 09/12/23 0809   enoxaparin (LOVENOX) injection 30 mg  30 mg Subcutaneous Q24H Madelyn Flavors A, MD   30 mg at 09/11/23 2117   guaiFENesin-dextromethorphan (ROBITUSSIN DM) 100-10 MG/5ML syrup 5 mL  5 mL Oral Q4H PRN Madelyn Flavors A, MD       sodium bicarbonate tablet 650 mg  650 mg Oral BID Carollee Herter, DO   650 mg at 09/12/23 4010   sodium chloride flush (NS) 0.9 % injection 3 mL  3 mL Intravenous Q12H Tupac Jeffus, Rondell A, MD   3 mL at 09/12/23 0807   traZODone (DESYREL) tablet 50 mg  50 mg Oral QHS Madelyn Flavors A, MD   50 mg at 09/11/23 2117     Discharge Medications: Please see discharge summary for a list of discharge medications.  Relevant  Imaging Results:  Relevant Lab Results:   Additional Information SS# [id number], [place]

## 2023-09-12 NOTE — Hospital Course (Signed)
HPI: Diane Skinner is a 89 y.o. female with medical history significant of  chronic kidney disease, hypertension, and depression who presents with new onset tremors and weakness. The patient's caregiver reported that she had been experiencing these symptoms, which were severe enough to interfere with dressing. She had not been feeling well for a few days prior to the hospital admission.   She had recently started on a new medication, Celexa, prescribed by her primary care physician, Dr. Parke Simmers, for depression and sleep disturbances. Her other medications include Gabapentin, two unspecified blood pressure medications, a potassium supplement, and B12.   Her appetite has reportedly decreased, and she has been eating smaller meals. She also has a history of bloating and discomfort if she consumes too much food at once. Her fluid intake has been inadequate, leading to concerns about dehydration.   Her kidney function has reportedly decreased, and she has a history of chronic kidney disease. Despite this, she has never required dialysis. Her electrolyte levels were also found to be abnormal, with elevated potassium and low calcium levels.   She lives in a care facility and is primarily cared for by a caregiver named Marylene Land. Her great-niece, who is also her power of attorney, assists in coordinating her care. Her medications are prepackaged by a local pharmacy and delivered to her residence.  In the emergency department patient was noted to be afebrile with pulse 48-51, and all other vital signs maintained..  Labs significant for sodium 134, potassium 5.3, BUN 48, creatinine 2.69, high-sensitivity troponin negative x 2.  Influenza, COVID-19, and RSV screening were negative.  Chest x-ray noted enlarged heart with some vascular congestion, and a ectatic tortuous aorta.  Patient had been given 500 mL bolus normal saline IV fluids.   Significant Events: Admitted 09/11/2023 for tremors/weakness, AKI on CKD  4   Significant Labs: Wbc 9.5, Hgb 11.4, Mg 2.4, BUN 48, Scr 2.69, K 5.3  Significant Imaging Studies: Cxr Enlarged heart with some vascular congestion. Tortuous ectatic aorta. Pelvic xr Osteopenia. Degenerative changes   Antibiotic Therapy: Anti-infectives (From admission, onward)    None       Procedures:   Consultants:

## 2023-09-12 NOTE — Assessment & Plan Note (Signed)
09-12-2023 pt probably had reaction to celexa. Hold off on any antidepressants for now.  09-13-2023 discussed with pt's niece LaTonya yesterday. Do not start any antidepressants.  She is favor of a "less meds is better" strategy in this 89 yo patient.

## 2023-09-12 NOTE — Assessment & Plan Note (Signed)
09-12-2023 stable.

## 2023-09-12 NOTE — Assessment & Plan Note (Signed)
09-12-2023 chronic.

## 2023-09-12 NOTE — Assessment & Plan Note (Addendum)
09-12-2023 given pt's known CKD stage 4. ARB/ACEI are contraindicated. Especially in the face of her hyperkalemia. Hydrochlorothiazide also not a great choice in this 89 yo WF. She is probably prone to dehydration.  Better alternatives would be norvasc, hydralazine, long acting imdur. Will hold off adding any HTN meds until she gets off IVF to see what her real BP is.  09-13-2023 BP still elevated off IVF. Will start hydralazine 25 mg tid. Do not restart ARB or hydrochlorothiazide at discharge due to it causing hyperkalemia.

## 2023-09-12 NOTE — Assessment & Plan Note (Signed)
09-12-2023 continue with PT/OT efforts. Pt is 89 yo.  09-13-2023 improved off Celexa. Pt walked 150 feet with RW. Assisted by mobility aide.

## 2023-09-12 NOTE — Assessment & Plan Note (Addendum)
09-12-2023 baseline Scr 1.6. Scr 2.32, BUN 47. Continue with IVF for now. Repeat CMP in AM.

## 2023-09-12 NOTE — Progress Notes (Signed)
PROGRESS NOTE    LANAIYA GRIZZEL  WUJ:[id number] DOB: January 04, 1923 DOA: 09/11/2023 PCP: Renaye Rakers, MD  Subjective: Pt seen and examined. She is ordering her lunch with NT. She ordered a Coke with lunch.  No complaints.  Pt lives in independent living at [facility] in [place].     Hospital Course: HPI: CORTNE KOWALKE is a 89 y.o. female with medical history significant of  chronic kidney disease, hypertension, and depression who presents with new onset tremors and weakness. The patient's caregiver reported that she had been experiencing these symptoms, which were severe enough to interfere with dressing. She had not been feeling well for a few days prior to the hospital admission.   She had recently started on a new medication, Celexa, prescribed by her primary care physician, Dr. Parke Simmers, for depression and sleep disturbances. Her other medications include Gabapentin, two unspecified blood pressure medications, a potassium supplement, and B12.   Her appetite has reportedly decreased, and she has been eating smaller meals. She also has a history of bloating and discomfort if she consumes too much food at once. Her fluid intake has been inadequate, leading to concerns about dehydration.   Her kidney function has reportedly decreased, and she has a history of chronic kidney disease. Despite this, she has never required dialysis. Her electrolyte levels were also found to be abnormal, with elevated potassium and low calcium levels.   She lives in a care facility and is primarily cared for by a caregiver named Marylene Land. Her great-niece, who is also her power of attorney, assists in coordinating her care. Her medications are prepackaged by a local pharmacy and delivered to her residence.  In the emergency department patient was noted to be afebrile with pulse 48-51, and all other vital signs maintained..  Labs significant for sodium 134, potassium 5.3, BUN 48, creatinine 2.69,  high-sensitivity troponin negative x 2.  Influenza, COVID-19, and RSV screening were negative.  Chest x-ray noted enlarged heart with some vascular congestion, and a ectatic tortuous aorta.  Patient had been given 500 mL bolus normal saline IV fluids.   Significant Events: Admitted 09/11/2023 for tremors/weakness, AKI on CKD 4   Significant Labs: Wbc 9.5, Hgb 11.4, Mg 2.4, BUN 48, Scr 2.69, K 5.3  Significant Imaging Studies: Cxr Enlarged heart with some vascular congestion. Tortuous ectatic aorta. Pelvic xr Osteopenia. Degenerative changes   Antibiotic Therapy: Anti-infectives (From admission, onward)    None       Procedures:   Consultants:     Assessment and Plan: * Tremor 09-12-2023 may have been caused by new medication Celexa. Have stopped celexa. Continue with PT/OT.  Acute kidney injury superimposed on chronic kidney disease (HCC) - stage 4. baseline Scr 1.6 09-12-2023 baseline Scr 1.6. Scr 2.32, BUN 47. Continue with IVF for now. Repeat CMP in AM.  Hyperkalemia 09-12-2023 K 5.7 today. Give 1 dose of lokelma. Continue with IVF. Pt was on Diovan-HCT as outpatient. This ARB/hydrochlorothiazide class is probably not the best choice in this 89 yo patient with known CKD stage 4. She will need alternative hypertensive medications.  Hypocalcemia 09-12-2023 stable.  Weakness 09-12-2023 continue with PT/OT efforts. Pt is 89 yo.  Alzheimer's dementia without behavioral disturbance (HCC) 09-12-2023 chronic  Depression 09-12-2023 pt probably had reaction to celexa. Hold off on any antidepressants for now.  Normocytic anemia 09-12-2023 stable.  Chronic sinus bradycardia 09-12-2023 stable. Baseline Hrs in the 50s.  Therefore betablockers and non-dihydropyridine CCB(cardizem, verapamil) not an option for HTN management.  Essential hypertension 09-12-2023 given pt's known CKD stage 4. ARB/ACEI are contraindicated. Especially in the face of her hyperkalemia.  Hydrochlorothiazide also not a great choice in this 89 yo WF. She is probably prone to dehydration.  Better alternatives would be norvasc, hydralazine, long acting imdur. Will hold off adding any HTN meds until she gets off IVF to see what her real BP is.    DVT prophylaxis: enoxaparin (LOVENOX) injection 30 mg Start: 09/11/23 2200     Code Status: Full Code Family Communication: no family at bedside Disposition Plan: return to independent living [facility] Reason for continuing need for hospitalization: remains on IVF.  Objective: Vitals:   09/12/23 0423 09/12/23 0808 09/12/23 0809 09/12/23 1157  BP: 128/64 127/60 127/60 (!) 122/57  Pulse: 61 62 63 (!) 54  Resp: 18 17 18 18   Temp: 98.7 F (37.1 C) 98.2 F (36.8 C) 98.2 F (36.8 C) 97.9 F (36.6 C)  TempSrc: Oral Oral Oral Oral  SpO2: 94% 95% 95% 95%  Weight:      Height:        Intake/Output Summary (Last 24 hours) at 09/12/2023 1338 Last data filed at 09/12/2023 0456 Gross per 24 hour  Intake 498 ml  Output --  Net 498 ml   Filed Weights   09/11/23 0947 09/11/23 1521  Weight: 59 kg 55.7 kg    Examination:  Physical Exam Vitals and nursing note reviewed.  Constitutional:      General: She is not in acute distress.    Appearance: She is not toxic-appearing or diaphoretic.  HENT:     Head: Normocephalic and atraumatic.  Neurological:     Mental Status: She is alert.     Data Reviewed: I have personally reviewed following labs and imaging studies  CBC: Recent Labs  Lab 09/11/23 1000 09/12/23 0553  WBC 9.5 7.8  NEUTROABS 6.7  --   HGB 11.4* 11.4*  HCT 37.1 37.7  MCV 83.2 84.0  PLT 143* 143*   Basic Metabolic Panel: Recent Labs  Lab 09/11/23 1000 09/12/23 0553  NA 134* 141  K 5.3* 5.7*  CL 106 116*  CO2 19* 19*  GLUCOSE 88 90  BUN 48* 47*  CREATININE 2.69* 2.32*  CALCIUM 8.2* 8.4*  MG 2.4  --    GFR: Estimated Creatinine Clearance: 11.9 mL/min (A) (by C-G formula based on SCr of  2.32 mg/dL (H)). Liver Function Tests: Recent Labs  Lab 09/11/23 1000  AST 15  ALT 8  ALKPHOS 60  BILITOT 0.5  PROT 6.4*  ALBUMIN 3.3*   Cardiac Enzymes: Recent Labs  Lab 09/11/23 1524  CKTOTAL 36*   Thyroid Function Tests: Recent Labs    09/11/23 1524  TSH 3.156    Recent Results (from the past 240 hours)  Resp panel by RT-PCR (RSV, Flu A&B, Covid) Anterior Nasal Swab     Status: None   Collection Time: 09/11/23 10:00 AM   Specimen: Anterior Nasal Swab  Result Value Ref Range Status   SARS Coronavirus 2 by RT PCR NEGATIVE NEGATIVE Final   Influenza A by PCR NEGATIVE NEGATIVE Final   Influenza B by PCR NEGATIVE NEGATIVE Final    Comment: (NOTE) The Xpert Xpress SARS-CoV-2/FLU/RSV plus assay is intended as an aid in the diagnosis of influenza from Nasopharyngeal swab specimens and should not be used as a sole basis for treatment. Nasal washings and aspirates are unacceptable for Xpert Xpress SARS-CoV-2/FLU/RSV testing.  Fact Sheet for Patients: BloggerCourse.com  Fact Sheet for  Healthcare Providers: SeriousBroker.it  This test is not yet approved or cleared by the [place] and has been authorized for detection and/or diagnosis of SARS-CoV-2 by FDA under an Emergency Use Authorization (EUA). This EUA will remain in effect (meaning this test can be used) for the duration of the COVID-19 declaration under Section 564(b)(1) of the Act, 21 U.S.C. section 360bbb-3(b)(1), unless the authorization is terminated or revoked.     Resp Syncytial Virus by PCR NEGATIVE NEGATIVE Final    Comment: (NOTE) Fact Sheet for Patients: BloggerCourse.com  Fact Sheet for Healthcare Providers: SeriousBroker.it  This test is not yet approved or cleared by the [place] FDA and has been authorized for detection and/or diagnosis of SARS-CoV-2 by FDA under an Emergency Use  Authorization (EUA). This EUA will remain in effect (meaning this test can be used) for the duration of the COVID-19 declaration under Section 564(b)(1) of the Act, 21 U.S.C. section 360bbb-3(b)(1), unless the authorization is terminated or revoked.  Performed at [facility], [address] [place], [place] [address]   MRSA Next Gen by PCR, Nasal     Status: None   Collection Time: 09/11/23  4:01 PM   Specimen: Nasal Mucosa; Nasal Swab  Result Value Ref Range Status   MRSA by PCR Next Gen NOT DETECTED NOT DETECTED Final    Comment: (NOTE) The GeneXpert MRSA Assay (FDA approved for NASAL specimens only), is one component of a comprehensive MRSA colonization surveillance program. It is not intended to diagnose MRSA infection nor to guide or monitor treatment for MRSA infections. Test performance is not FDA approved in patients less than 42 years old. Performed at [facility], [address] [place], [place] [address]      Radiology Studies: DG Pelvis 1-2 Views Result Date: 09/11/2023 CLINICAL DATA:  Weakness.  No reported history of trauma EXAM: PELVIS - 1 VIEW COMPARISON:  X-ray 12/05/2022. FINDINGS: Severe osteopenia. Slight joint space loss of the hips. Mild degenerative changes of the sacroiliac joints as well. No fracture or dislocation. Degenerative changes along the lower lumbar spine. Overlapping cardiac leads. IMPRESSION: Osteopenia.  Degenerative changes. Electronically Signed   By: Karen Kays M.D.   On: 09/11/2023 11:45   DG Chest 2 View Result Date: 09/11/2023 CLINICAL DATA:  Weakness EXAM: CHEST - 2 VIEW COMPARISON:  X-ray 12/05/2022. FINDINGS: Enlarged cardiopericardial silhouette. Tortuous and ectatic aorta. Vascular congestion. No pneumothorax or effusion. No edema. Overlapping cardiac leads. Curvature and degenerative changes along the spine. IMPRESSION: Enlarged heart with some vascular congestion. Tortuous ectatic aorta. Curvature and degenerative  changes along the spine. Electronically Signed   By: Karen Kays M.D.   On: 09/11/2023 11:44    Scheduled Meds:  amLODipine  10 mg Oral Daily   enoxaparin (LOVENOX) injection  30 mg Subcutaneous Q24H   sodium bicarbonate  650 mg Oral BID   sodium chloride flush  3 mL Intravenous Q12H   traZODone  50 mg Oral QHS   Continuous Infusions:  sodium chloride 50 mL/hr at 09/12/23 0456     LOS: 0 days   Time spent: 40 minutes  Carollee Herter, DO  [facility]  09/12/2023, 1:38 PM

## 2023-09-12 NOTE — Assessment & Plan Note (Signed)
09-12-2023 may have been caused by new medication Celexa. Have stopped celexa. Continue with PT/OT.  09-13-2023 improved off Celexa.  Pt walked 150 feet today with PT. Will add Celexa to allergy list

## 2023-09-13 DIAGNOSIS — N179 Acute kidney failure, unspecified: Secondary | ICD-10-CM | POA: Diagnosis not present

## 2023-09-13 DIAGNOSIS — E875 Hyperkalemia: Secondary | ICD-10-CM | POA: Diagnosis not present

## 2023-09-13 DIAGNOSIS — R251 Tremor, unspecified: Secondary | ICD-10-CM | POA: Diagnosis not present

## 2023-09-13 DIAGNOSIS — I10 Essential (primary) hypertension: Secondary | ICD-10-CM | POA: Diagnosis not present

## 2023-09-13 LAB — COMPREHENSIVE METABOLIC PANEL
ALT: 7 U/L (ref 0–44)
AST: 14 U/L — ABNORMAL LOW (ref 15–41)
Albumin: 2.8 g/dL — ABNORMAL LOW (ref 3.5–5.0)
Alkaline Phosphatase: 57 U/L (ref 38–126)
Anion gap: 5 (ref 5–15)
BUN: 38 mg/dL — ABNORMAL HIGH (ref 8–23)
CO2: 22 mmol/L (ref 22–32)
Calcium: 8.4 mg/dL — ABNORMAL LOW (ref 8.9–10.3)
Chloride: 114 mmol/L — ABNORMAL HIGH (ref 98–111)
Creatinine, Ser: 2.09 mg/dL — ABNORMAL HIGH (ref 0.44–1.00)
GFR, Estimated: 21 mL/min — ABNORMAL LOW (ref >60)
Glucose, Bld: 90 mg/dL (ref 70–99)
Potassium: 4.6 mmol/L (ref 3.5–5.1)
Sodium: 141 mmol/L (ref 135–145)
Total Bilirubin: 0.7 mg/dL (ref <1.2)
Total Protein: 5.9 g/dL — ABNORMAL LOW (ref 6.5–8.1)

## 2023-09-13 LAB — CBC WITH DIFFERENTIAL/PLATELET
Abs Immature Granulocytes: 0.02 10*3/uL (ref 0.00–0.07)
Basophils Absolute: 0 10*3/uL (ref 0.0–0.1)
Basophils Relative: 1 %
Eosinophils Absolute: 0 10*3/uL (ref 0.0–0.5)
Eosinophils Relative: 0 %
HCT: 34.4 % — ABNORMAL LOW (ref 36.0–46.0)
Hemoglobin: 10.5 g/dL — ABNORMAL LOW (ref 12.0–15.0)
Immature Granulocytes: 0 %
Lymphocytes Relative: 35 %
Lymphs Abs: 2.1 10*3/uL (ref 0.7–4.0)
MCH: 24.9 pg — ABNORMAL LOW (ref 26.0–34.0)
MCHC: 30.5 g/dL (ref 30.0–36.0)
MCV: 81.7 fL (ref 80.0–100.0)
Monocytes Absolute: 0.5 10*3/uL (ref 0.1–1.0)
Monocytes Relative: 8 %
Neutro Abs: 3.3 10*3/uL (ref 1.7–7.7)
Neutrophils Relative %: 56 %
Platelets: 146 10*3/uL — ABNORMAL LOW (ref 150–400)
RBC: 4.21 MIL/uL (ref 3.87–5.11)
RDW: 14.6 % (ref 11.5–15.5)
WBC: 5.9 10*3/uL (ref 4.0–10.5)
nRBC: 0 % (ref 0.0–0.2)

## 2023-09-13 LAB — MAGNESIUM: Magnesium: 2.2 mg/dL (ref 1.7–2.4)

## 2023-09-13 MED ORDER — HYDRALAZINE HCL 25 MG PO TABS
25.0000 mg | ORAL_TABLET | Freq: Three times a day (TID) | ORAL | 0 refills | Status: DC
  Filled 2023-09-13: qty 270, 90d supply, fill #0

## 2023-09-13 MED ORDER — HYDRALAZINE HCL 25 MG PO TABS
25.0000 mg | ORAL_TABLET | Freq: Three times a day (TID) | ORAL | Status: DC
  Administered 2023-09-13 – 2023-09-14 (×3): 25 mg via ORAL
  Filled 2023-09-13 (×3): qty 1

## 2023-09-13 MED ORDER — HYDRALAZINE HCL 25 MG PO TABS: 25.0000 mg | ORAL_TABLET | Freq: Three times a day (TID) | ORAL | 0 refills | Status: DC

## 2023-09-13 NOTE — Progress Notes (Signed)
Mobility Specialist Progress Note:   09/13/23 0905  Mobility  Activity Ambulated with assistance in hallway  Level of Assistance Contact guard assist, steadying assist  Assistive Device Front wheel walker  Distance Ambulated (ft) 150 ft  Activity Response Tolerated well  Mobility Referral Yes  Mobility visit 1 Mobility  Mobility Specialist Start Time (ACUTE ONLY) [id number]  Mobility Specialist Stop Time (ACUTE ONLY) 0915  Mobility Specialist Time Calculation (min) (ACUTE ONLY) 10 min   Pt agreeable to mobility session. Required only contact guard assistance throughout ambulation. No c/o throughout, pt back in bed with all needs met.   Addison Lank Mobility Specialist Please contact via SecureChat or  Rehab office at [phone]

## 2023-09-13 NOTE — TOC Progression Note (Signed)
Transition of Care [facility]) - Progression Note    Patient Details  Name: Diane Skinner MRN: [id number] Date of Birth: [birth year]  Transition of Care [facility]) CM/SW Contact  Carley Hammed, LCSW Phone Number: 09/13/2023, 12:12 PM  Clinical Narrative:    CSW spoke with pt's niece who noted pt had done much better with mobility today and plans on pt returning to ILF tomorrow. Niece requests PTAR transport back to facility. TOC will continue to follow.      Barriers to Discharge: Other (must enter comment) (Needing family communication)  Expected Discharge Plan and Services In-house Referral: Clinical Social Work   Post Acute Care Choice:  (TBD) Living arrangements for the past 2 months: Independent Living Facility                                       Social Determinants of Health (SDOH) Interventions SDOH Screenings   Food Insecurity: No Food Insecurity (09/11/2023)  Housing: Low Risk  (09/11/2023)  Transportation Needs: No Transportation Needs (09/11/2023)  Utilities: Not At Risk (09/11/2023)  Tobacco Use: Low Risk  (09/11/2023)    Readmission Risk Interventions     No data to display

## 2023-09-13 NOTE — Progress Notes (Signed)
PROGRESS NOTE    Diane Skinner  WUX:[id number] DOB: 1922-12-17 DOA: 09/11/2023 PCP: Diane Rakers, MD  Subjective: Pt seen and examined. Stable. I had talked to pt's niece Diane Skinner yesterday via phone.  Plan for DC back to independent living tomorrow.  AKI has improved. Scr back to 2.09. baseline around 2.0  Pt walked 150 feet today with RW.  CM aware of DC back to independent living tomorrow. Pt will need ambulance at discharge.   Hospital Course: HPI: Diane Skinner is a 89 y.o. female with medical history significant of  chronic kidney disease, hypertension, and depression who presents with new onset tremors and weakness. The patient's caregiver reported that she had been experiencing these symptoms, which were severe enough to interfere with dressing. She had not been feeling well for a few days prior to the hospital admission.   She had recently started on a new medication, Celexa, prescribed by her primary care physician, Diane Skinner, for depression and sleep disturbances. Her other medications include Gabapentin, two unspecified blood pressure medications, a potassium supplement, and B12.   Her appetite has reportedly decreased, and she has been eating smaller meals. She also has a history of bloating and discomfort if she consumes too much food at once. Her fluid intake has been inadequate, leading to concerns about dehydration.   Her kidney function has reportedly decreased, and she has a history of chronic kidney disease. Despite this, she has never required dialysis. Her electrolyte levels were also found to be abnormal, with elevated potassium and low calcium levels.   She lives in a care facility and is primarily cared for by a caregiver named Diane Skinner. Her great-niece, who is also her power of attorney, assists in coordinating her care. Her medications are prepackaged by a local pharmacy and delivered to her residence.  In the emergency department patient was noted to be  afebrile with pulse 48-51, and all other vital signs maintained..  Labs significant for sodium 134, potassium 5.3, BUN 48, creatinine 2.69, high-sensitivity troponin negative x 2.  Influenza, COVID-19, and RSV screening were negative.  Chest x-ray noted enlarged heart with some vascular congestion, and a ectatic tortuous aorta.  Patient had been given 500 mL bolus normal saline IV fluids.   Significant Events: Admitted 09/11/2023 for tremors/weakness, AKI on CKD 4   Significant Labs: Wbc 9.5, Hgb 11.4, Mg 2.4, BUN 48, Scr 2.69, K 5.3  Significant Imaging Studies: Cxr Enlarged heart with some vascular congestion. Tortuous ectatic aorta. Pelvic xr Osteopenia. Degenerative changes   Antibiotic Therapy: Anti-infectives (From admission, onward)    None       Procedures:   Consultants:     Assessment and Plan: * Tremor 09-12-2023 may have been caused by new medication Celexa. Have stopped celexa. Continue with PT/OT.  09-13-2023 improved off Celexa.  Pt walked 150 feet today with PT. Will add Celexa to allergy list  Acute kidney injury superimposed on chronic kidney disease (HCC) - stage 4. baseline Scr 1.6-2.0 09-12-2023 baseline 1.6 to 2.0. Scr 2.32, BUN 47. Continue with IVF for now. Repeat CMP in AM. 09-13-2023 Scr 2.09 today.  Baseline 1.6-2.0  Hyperkalemia 09-12-2023 K 5.7 today. Give 1 dose of lokelma. Continue with IVF. Pt was on Diovan-HCT as outpatient. This ARB/hydrochlorothiazide class is probably not the best choice in this 89 yo patient with known CKD stage 4. She will need alternative hypertensive medications.  09-13-2023 resolved. Pt needs to stay off ARB and hydrochlorothiazide at discharge.  Hypocalcemia 09-12-2023 stable.  Weakness 09-12-2023 continue with PT/OT efforts. Pt is 89 yo.  09-13-2023 improved off Celexa. Pt walked 150 feet with RW. Assisted by mobility aide.  Alzheimer's dementia without behavioral disturbance (HCC) 09-12-2023  chronic  Depression 09-12-2023 pt probably had reaction to celexa. Hold off on any antidepressants for now.  09-13-2023 discussed with pt's niece Diane Skinner yesterday. Do not start any antidepressants.  She is favor of a "less meds is better" strategy in this 89 yo patient.  Normocytic anemia 09-12-2023 stable.  Chronic sinus bradycardia 09-12-2023 stable. Baseline Hrs in the 50s.  Therefore betablockers and non-dihydropyridine CCB(cardizem, verapamil) not an option for HTN management.  Essential hypertension 09-12-2023 given pt's known CKD stage 4. ARB/ACEI are contraindicated. Especially in the face of her hyperkalemia. Hydrochlorothiazide also not a great choice in this 89 yo WF. She is probably prone to dehydration.  Better alternatives would be norvasc, hydralazine, long acting imdur. Will hold off adding any HTN meds until she gets off IVF to see what her real BP is.  09-13-2023 BP still elevated off IVF. Will start hydralazine 25 mg tid. Do not restart ARB or hydrochlorothiazide at discharge due to it causing hyperkalemia.       DVT prophylaxis: enoxaparin (LOVENOX) injection 30 mg Start: 09/11/23 2200    Code Status: Full Code Family Communication: no family at bedside. I talked with pt's niece Diane Skinner on 09-12-2023 Disposition Plan: return to independent living Reason for continuing need for hospitalization: medically stable for DC  Objective: Vitals:   09/12/23 2119 09/13/23 0023 09/13/23 0426 09/13/23 0745  BP: (!) 136/52 127/65 (!) 156/63 (!) 150/67  Pulse: (!) 54 (!) 57 (!) 53 (!) 56  Resp: 17 18 17 18   Temp: 98.1 F (36.7 C) 98.1 F (36.7 C) (!) 97.2 F (36.2 C) 97.9 F (36.6 C)  TempSrc: Oral Oral Oral   SpO2: 97% 98% 100% 95%  Weight:      Height:        Intake/Output Summary (Last 24 hours) at 09/13/2023 1439 Last data filed at 09/12/2023 2319 Gross per 24 hour  Intake 3 ml  Output --  Net 3 ml   Filed Weights   09/11/23 0947 09/11/23 1521   Weight: 59 kg 55.7 kg    Examination:  Physical Exam Vitals and nursing note reviewed.  Constitutional:      General: She is not in acute distress.    Appearance: She is not diaphoretic.  HENT:     Head: Normocephalic and atraumatic.     Nose: Nose normal.  Cardiovascular:     Rate and Rhythm: Normal rate and regular rhythm.  Pulmonary:     Effort: Pulmonary effort is normal.  Abdominal:     General: Abdomen is flat.  Skin:    General: Skin is warm and dry.     Capillary Refill: Capillary refill takes less than 2 seconds.  Neurological:     Mental Status: She is alert.     Data Reviewed: I have personally reviewed following labs and imaging studies  CBC: Recent Labs  Lab 09/11/23 1000 09/12/23 0553 09/13/23 0553  WBC 9.5 7.8 5.9  NEUTROABS 6.7  --  3.3  HGB 11.4* 11.4* 10.5*  HCT 37.1 37.7 34.4*  MCV 83.2 84.0 81.7  PLT 143* 143* 146*   Basic Metabolic Panel: Recent Labs  Lab 09/11/23 1000 09/12/23 0553 09/13/23 0553  NA 134* 141 141  K 5.3* 5.7* 4.6  CL 106 116* 114*  CO2 19* 19* 22  GLUCOSE 88 90 90  BUN 48* 47* 38*  CREATININE 2.69* 2.32* 2.09*  CALCIUM 8.2* 8.4* 8.4*  MG 2.4  --  2.2   GFR: Estimated Creatinine Clearance: 13.2 mL/min (A) (by C-G formula based on SCr of 2.09 mg/dL (H)). Liver Function Tests: Recent Labs  Lab 09/11/23 1000 09/13/23 0553  AST 15 14*  ALT 8 7  ALKPHOS 60 57  BILITOT 0.5 0.7  PROT 6.4* 5.9*  ALBUMIN 3.3* 2.8*   Cardiac Enzymes: Recent Labs  Lab 09/11/23 1524  CKTOTAL 36*   Thyroid Function Tests: Recent Labs    09/11/23 1524  TSH 3.156    Recent Results (from the past 240 hours)  Resp panel by RT-PCR (RSV, Flu A&B, Covid) Anterior Nasal Swab     Status: None   Collection Time: 09/11/23 10:00 AM   Specimen: Anterior Nasal Swab  Result Value Ref Range Status   SARS Coronavirus 2 by RT PCR NEGATIVE NEGATIVE Final   Influenza A by PCR NEGATIVE NEGATIVE Final   Influenza B by PCR NEGATIVE  NEGATIVE Final    Comment: (NOTE) The Xpert Xpress SARS-CoV-2/FLU/RSV plus assay is intended as an aid in the diagnosis of influenza from Nasopharyngeal swab specimens and should not be used as a sole basis for treatment. Nasal washings and aspirates are unacceptable for Xpert Xpress SARS-CoV-2/FLU/RSV testing.  Fact Sheet for Patients: BloggerCourse.com  Fact Sheet for Healthcare Providers: SeriousBroker.it  This test is not yet approved or cleared by the [place] FDA and has been authorized for detection and/or diagnosis of SARS-CoV-2 by FDA under an Emergency Use Authorization (EUA). This EUA will remain in effect (meaning this test can be used) for the duration of the COVID-19 declaration under Section 564(b)(1) of the Act, 21 U.S.C. section 360bbb-3(b)(1), unless the authorization is terminated or revoked.     Resp Syncytial Virus by PCR NEGATIVE NEGATIVE Final    Comment: (NOTE) Fact Sheet for Patients: BloggerCourse.com  Fact Sheet for Healthcare Providers: SeriousBroker.it  This test is not yet approved or cleared by the [place] FDA and has been authorized for detection and/or diagnosis of SARS-CoV-2 by FDA under an Emergency Use Authorization (EUA). This EUA will remain in effect (meaning this test can be used) for the duration of the COVID-19 declaration under Section 564(b)(1) of the Act, 21 U.S.C. section 360bbb-3(b)(1), unless the authorization is terminated or revoked.  Performed at [facility], [address] [place], [place] [address]   MRSA Next Gen by PCR, Nasal     Status: None   Collection Time: 09/11/23  4:01 PM   Specimen: Nasal Mucosa; Nasal Swab  Result Value Ref Range Status   MRSA by PCR Next Gen NOT DETECTED NOT DETECTED Final    Comment: (NOTE) The GeneXpert MRSA Assay (FDA approved for NASAL specimens only), is one component  of a comprehensive MRSA colonization surveillance program. It is not intended to diagnose MRSA infection nor to guide or monitor treatment for MRSA infections. Test performance is not FDA approved in patients less than 87 years old. Performed at [facility], [address] [place], [place] [address]      Radiology Studies: No results found.  Scheduled Meds:  amLODipine  10 mg Oral Daily   enoxaparin (LOVENOX) injection  30 mg Subcutaneous Q24H   hydrALAZINE  25 mg Oral Q8H   sodium bicarbonate  650 mg Oral BID   sodium chloride flush  3 mL Intravenous Q12H   traZODone  50 mg Oral QHS   Continuous Infusions:   LOS: 1 day   Time spent: 40 minutes  Carollee Herter, DO  [facility]  09/13/2023, 2:39 PM

## 2023-09-13 NOTE — Plan of Care (Signed)

## 2023-09-13 NOTE — Discharge Summary (Signed)
[facility] Physician Discharge Summary   Patient name: Diane Skinner  Admit date:     09/11/2023  Discharge date: 09/14/2023  Attending Physician: Imogene Burn, ERIC [[id number]]  Discharge Physician: Joseph Art   PCP: Renaye Rakers, MD  Admitted From: [facility] Disposition:   [facility]  Recommendations for Outpatient Follow-up:  Follow up with PCP in 1-2 weeks for HTN management Home health BMP/CBC 1 week  Discharge Condition:Stable CODE STATUS:FULL Diet recommendation: Heart Healthy Fluid Restriction: None  Hospital Summary: HPI: Diane Skinner is a 89 y.o. female with medical history significant of  chronic kidney disease, hypertension, and depression who presents with new onset tremors and weakness. The patient's caregiver reported that she had been experiencing these symptoms, which were severe enough to interfere with dressing. She had not been feeling well for a few days prior to the hospital admission.   She had recently started on a new medication, Celexa, prescribed by her primary care physician, Dr. Parke Simmers, for depression and sleep disturbances. Her other medications include Gabapentin, two unspecified blood pressure medications, a potassium supplement, and B12.   Her appetite has reportedly decreased, and she has been eating smaller meals. She also has a history of bloating and discomfort if she consumes too much food at once. Her fluid intake has been inadequate, leading to concerns about dehydration.   Her kidney function has reportedly decreased, and she has a history of chronic kidney disease. Despite this, she has never required dialysis. Her electrolyte levels were also found to be abnormal, with elevated potassium and low calcium levels.   She lives in a care facility and is primarily cared for by a caregiver named Diane Skinner. Her great-niece, who is also her power of attorney, assists in coordinating her care. Her medications are prepackaged by a  local pharmacy and delivered to her residence.  In the emergency department patient was noted to be afebrile with pulse 48-51, and all other vital signs maintained..  Labs significant for sodium 134, potassium 5.3, BUN 48, creatinine 2.69, high-sensitivity troponin negative x 2.  Influenza, COVID-19, and RSV screening were negative.  Chest x-ray noted enlarged heart with some vascular congestion, and a ectatic tortuous aorta.  Patient had been given 500 mL bolus normal saline IV fluids.   Significant Imaging Studies: Cxr Enlarged heart with some vascular congestion. Tortuous ectatic aorta. Pelvic xr Osteopenia. Degenerative changes      Hospital Course by Problem: * Tremor 09-12-2023 may have been caused by new medication Celexa. Have stopped celexa. Continue with PT/OT.  09-13-2023 improved off Celexa.  Pt walked 150 feet today with PT. Will add Celexa to allergy list  Acute kidney injury superimposed on chronic kidney disease (HCC) - stage 4. baseline Scr 1.6-2.0 09-12-2023 baseline 1.6 to 2.0. Scr 2.32, BUN 47. Continue with IVF for now. Repeat CMP in AM. 09-13-2023 Scr 2.09 today.  Baseline 1.6-2.0  Hyperkalemia 09-12-2023 K 5.7 today. Give 1 dose of lokelma. Continue with IVF. Pt was on Diovan-HCT as outpatient. This ARB/hydrochlorothiazide class is probably not the best choice in this 89 yo patient with known CKD stage 4. She will need alternative hypertensive medications.  09-13-2023 resolved. Pt needs to stay off ARB and hydrochlorothiazide at discharge.  Hypocalcemia 09-12-2023 stable.  Weakness 09-12-2023 continue with PT/OT efforts. Pt is 89 yo.  09-13-2023 improved off Celexa. Pt walked 150 feet with RW. Assisted by mobility aide.  Alzheimer's dementia without behavioral disturbance (HCC) 09-12-2023 chronic  Depression 09-12-2023 pt probably  had reaction to celexa. Hold off on any antidepressants for now.  09-13-2023 Dr. Imogene Burn discussed with pt's niece Diane Skinner  yesterday. Do not start any antidepressants.  She is favor of a "less meds is better" strategy in this 89 yo patient.  Normocytic anemia 09-12-2023 stable.  Chronic sinus bradycardia 09-12-2023 stable. Baseline Hrs in the 50s.  Therefore betablockers and non-dihydropyridine CCB(cardizem, verapamil) not an option for HTN management.  Essential hypertension 09-12-2023 given pt's known CKD stage 4. ARB/ACEI are contraindicated. Especially in the face of her hyperkalemia. Hydrochlorothiazide also not a great choice in this 89 yo WF. She is probably prone to dehydration.  Better alternatives would be norvasc, hydralazine, long acting imdur.   09-13-2023 BP still elevated off IVF. Will start hydralazine 25 mg tid. Do not restart ARB or hydrochlorothiazide at discharge due to it causing hyperkalemia.    Discharge Diagnoses:  Principal Problem:   Tremor Active Problems:   Acute kidney injury superimposed on chronic kidney disease (HCC) - stage 4. baseline Scr 1.6-2.0   Weakness   Hypocalcemia   Hyperkalemia   Essential hypertension   Chronic sinus bradycardia   Normocytic anemia   Depression   Alzheimer's dementia without behavioral disturbance [facility])   Discharge Instructions  Discharge Instructions     Call MD for:  extreme fatigue   Complete by: As directed    Call MD for:  persistant dizziness or light-headedness   Complete by: As directed    Call MD for:  persistant nausea and vomiting   Complete by: As directed    Call MD for:  severe uncontrolled pain   Complete by: As directed    Call MD for:  temperature >100.4   Complete by: As directed    Diet - low sodium heart healthy   Complete by: As directed    Discharge instructions   Complete by: As directed    1. Follow up with your primary care provider in 1-2 weeks following hospital discharge for High Blood Pressure management. 2. Do NOT restart Diovan-HCT(valsartan-hydrochlorothiazide) due to it causing hyperkalemia    Face-to-face encounter (required for Medicare/Medicaid patients)   Complete by: As directed    I Carollee Herter certify that this patient is under my care and that I, or a nurse practitioner or physician's assistant working with me, had a face-to-face encounter that meets the physician face-to-face encounter requirements with this patient on 09/13/2023. The encounter with the patient was in whole, or in part for the following medical condition(s) which is the primary reason for home health care (List medical condition): debility   The encounter with the patient was in whole, or in part, for the following medical condition, which is the primary reason for home health care: debility   I certify that, based on my findings, the following services are medically necessary home health services: Physical therapy   Reason for Medically Necessary Home Health Services:  Therapy- Investment banker, operational, Patent examiner Therapy- Instruction on use of Assistive Device for Ambulation on all Surfaces     My clinical findings support the need for the above services: Cognitive impairments, dementia, or mental confusion  that make it unsafe to leave home   Further, I certify that my clinical findings support that this patient is homebound due to:  Ambulates short distances less than 300 feet Mental confusion     Home Health   Complete by: As directed    Send to [facility] in-house PT/OT company. ?? [facility]??  To provide the following care/treatments:  PT OT     Increase activity slowly   Complete by: As directed       Allergies as of 09/14/2023       Reactions   Ibuprofen Swelling   Celexa [citalopram] Other (See Comments)   Tremors of hands/legs   Valsartan-hydrochlorothiazide Other (See Comments)   Hyperkalemia        Medication List     STOP taking these medications    citalopram 20 MG tablet Commonly known as: CELEXA   valsartan-hydrochlorothiazide 160-25 MG tablet Commonly  known as: DIOVAN-HCT       TAKE these medications    amLODipine 10 MG tablet Commonly known as: NORVASC TAKE 1 TABLET BY MOUTH EVERY DAY   gabapentin 300 MG capsule Commonly known as: NEURONTIN Take 1 capsule (300 mg total) by mouth at bedtime. What changed: how much to take   hydrALAZINE 25 MG tablet Commonly known as: APRESOLINE Take 1 tablet (25 mg total) by mouth every 8 (eight) hours.   THERA-M PO Take by mouth daily.   traZODone 50 MG tablet Commonly known as: DESYREL Take 1 tablet (50 mg total) by mouth at bedtime.   VITAMIN B12 PO Take by mouth at bedtime.        Follow-up Information     Renaye Rakers, MD Follow up in 1 week(s).   Specialty: Family Medicine Contact information: [address] [place] [place] [address] [phone]                Allergies  Allergen Reactions   Ibuprofen Swelling   Celexa [Citalopram] Other (See Comments)    Tremors of hands/legs   Valsartan-Hydrochlorothiazide Other (See Comments)    Hyperkalemia    Discharge Exam: Vitals:   09/14/23 0618 09/14/23 0750  BP: (!) 148/64 (!) 146/67  Pulse: 66 63  Resp:    Temp:  99.3 F (37.4 C)  SpO2:  94%    Awake and alert  The results of significant diagnostics from this hospitalization (including imaging, microbiology, ancillary and laboratory) are listed below for reference.    Microbiology: Recent Results (from the past 240 hours)  Resp panel by RT-PCR (RSV, Flu A&B, Covid) Anterior Nasal Swab     Status: None   Collection Time: 09/11/23 10:00 AM   Specimen: Anterior Nasal Swab  Result Value Ref Range Status   SARS Coronavirus 2 by RT PCR NEGATIVE NEGATIVE Final   Influenza A by PCR NEGATIVE NEGATIVE Final   Influenza B by PCR NEGATIVE NEGATIVE Final    Comment: (NOTE) The Xpert Xpress SARS-CoV-2/FLU/RSV plus assay is intended as an aid in the diagnosis of influenza from Nasopharyngeal swab specimens and should not be used as a sole basis for treatment.  Nasal washings and aspirates are unacceptable for Xpert Xpress SARS-CoV-2/FLU/RSV testing.  Fact Sheet for Patients: BloggerCourse.com  Fact Sheet for Healthcare Providers: SeriousBroker.it  This test is not yet approved or cleared by the [place] FDA and has been authorized for detection and/or diagnosis of SARS-CoV-2 by FDA under an Emergency Use Authorization (EUA). This EUA will remain in effect (meaning this test can be used) for the duration of the COVID-19 declaration under Section 564(b)(1) of the Act, 21 U.S.C. section 360bbb-3(b)(1), unless the authorization is terminated or revoked.     Resp Syncytial Virus by PCR NEGATIVE NEGATIVE Final    Comment: (NOTE) Fact Sheet for Patients: BloggerCourse.com  Fact Sheet for Healthcare Providers: SeriousBroker.it  This test is  not yet approved or cleared by the [place] and has been authorized for detection and/or diagnosis of SARS-CoV-2 by FDA under an Emergency Use Authorization (EUA). This EUA will remain in effect (meaning this test can be used) for the duration of the COVID-19 declaration under Section 564(b)(1) of the Act, 21 U.S.C. section 360bbb-3(b)(1), unless the authorization is terminated or revoked.  Performed at [facility], [address], [place], [place] [address]   MRSA Next Gen by PCR, Nasal     Status: None   Collection Time: 09/11/23  4:01 PM   Specimen: Nasal Mucosa; Nasal Swab  Result Value Ref Range Status   MRSA by PCR Next Gen NOT DETECTED NOT DETECTED Final    Comment: (NOTE) The GeneXpert MRSA Assay (FDA approved for NASAL specimens only), is one component of a comprehensive MRSA colonization surveillance program. It is not intended to diagnose MRSA infection nor to guide or monitor treatment for MRSA infections. Test performance is not FDA approved in patients less than 69  years old. Performed at [facility], [address], [place], [place] [address]      Labs:  Basic Metabolic Panel: Recent Labs  Lab 09/11/23 1000 09/12/23 0553 09/13/23 0553  NA 134* 141 141  K 5.3* 5.7* 4.6  CL 106 116* 114*  CO2 19* 19* 22  GLUCOSE 88 90 90  BUN 48* 47* 38*  CREATININE 2.69* 2.32* 2.09*  CALCIUM 8.2* 8.4* 8.4*  MG 2.4  --  2.2   Liver Function Tests: Recent Labs  Lab 09/11/23 1000 09/13/23 0553  AST 15 14*  ALT 8 7  ALKPHOS 60 57  BILITOT 0.5 0.7  PROT 6.4* 5.9*  ALBUMIN 3.3* 2.8*   CBC: Recent Labs  Lab 09/11/23 1000 09/12/23 0553 09/13/23 0553  WBC 9.5 7.8 5.9  NEUTROABS 6.7  --  3.3  HGB 11.4* 11.4* 10.5*  HCT 37.1 37.7 34.4*  MCV 83.2 84.0 81.7  PLT 143* 143* 146*   Cardiac Enzymes: Recent Labs  Lab 09/11/23 1524  CKTOTAL 36*    Recent Labs    09/11/23 1524  TSH 3.156   Urinalysis    Component Value Date/Time   COLORURINE STRAW (A) 09/11/2023 1433   APPEARANCEUR CLEAR 09/11/2023 1433   LABSPEC 1.005 09/11/2023 1433   PHURINE 5.0 09/11/2023 1433   GLUCOSEU NEGATIVE 09/11/2023 1433   HGBUR NEGATIVE 09/11/2023 1433   BILIRUBINUR NEGATIVE 09/11/2023 1433   KETONESUR NEGATIVE 09/11/2023 1433   PROTEINUR NEGATIVE 09/11/2023 1433   UROBILINOGEN 0.2 03/08/2021 1316   NITRITE NEGATIVE 09/11/2023 1433   LEUKOCYTESUR NEGATIVE 09/11/2023 1433   Sepsis Labs Recent Labs  Lab 09/11/23 1000 09/12/23 0553 09/13/23 0553  WBC 9.5 7.8 5.9   Microbiology Recent Results (from the past 240 hours)  Resp panel by RT-PCR (RSV, Flu A&B, Covid) Anterior Nasal Swab     Status: None   Collection Time: 09/11/23 10:00 AM   Specimen: Anterior Nasal Swab  Result Value Ref Range Status   SARS Coronavirus 2 by RT PCR NEGATIVE NEGATIVE Final   Influenza A by PCR NEGATIVE NEGATIVE Final   Influenza B by PCR NEGATIVE NEGATIVE Final    Comment: (NOTE) The Xpert Xpress SARS-CoV-2/FLU/RSV plus assay is intended as an aid in the  diagnosis of influenza from Nasopharyngeal swab specimens and should not be used as a sole basis for treatment. Nasal washings and aspirates are unacceptable for Xpert Xpress SARS-CoV-2/FLU/RSV testing.  Fact Sheet for Patients: BloggerCourse.com  Fact Sheet for Healthcare Providers: SeriousBroker.it  This test is not yet approved or cleared by the [place] FDA and has been authorized for detection and/or diagnosis of SARS-CoV-2 by FDA under an Emergency Use Authorization (EUA). This EUA will remain in effect (meaning this test can be used) for the duration of the COVID-19 declaration under Section 564(b)(1) of the Act, 21 U.S.C. section 360bbb-3(b)(1), unless the authorization is terminated or revoked.     Resp Syncytial Virus by PCR NEGATIVE NEGATIVE Final    Comment: (NOTE) Fact Sheet for Patients: BloggerCourse.com  Fact Sheet for Healthcare Providers: SeriousBroker.it  This test is not yet approved or cleared by the [place] FDA and has been authorized for detection and/or diagnosis of SARS-CoV-2 by FDA under an Emergency Use Authorization (EUA). This EUA will remain in effect (meaning this test can be used) for the duration of the COVID-19 declaration under Section 564(b)(1) of the Act, 21 U.S.C. section 360bbb-3(b)(1), unless the authorization is terminated or revoked.  Performed at [facility], [address], [place], [place] [address]   MRSA Next Gen by PCR, Nasal     Status: None   Collection Time: 09/11/23  4:01 PM   Specimen: Nasal Mucosa; Nasal Swab  Result Value Ref Range Status   MRSA by PCR Next Gen NOT DETECTED NOT DETECTED Final    Comment: (NOTE) The GeneXpert MRSA Assay (FDA approved for NASAL specimens only), is one component of a comprehensive MRSA colonization surveillance program. It is not intended to diagnose MRSA infection nor to  guide or monitor treatment for MRSA infections. Test performance is not FDA approved in patients less than 50 years old. Performed at [facility], [address] [place], [place] [address]     Procedures/Studies: DG Pelvis 1-2 Views Result Date: 09/11/2023 CLINICAL DATA:  Weakness.  No reported history of trauma EXAM: PELVIS - 1 VIEW COMPARISON:  X-ray 12/05/2022. FINDINGS: Severe osteopenia. Slight joint space loss of the hips. Mild degenerative changes of the sacroiliac joints as well. No fracture or dislocation. Degenerative changes along the lower lumbar spine. Overlapping cardiac leads. IMPRESSION: Osteopenia.  Degenerative changes. Electronically Signed   By: Karen Kays M.D.   On: 09/11/2023 11:45   DG Chest 2 View Result Date: 09/11/2023 CLINICAL DATA:  Weakness EXAM: CHEST - 2 VIEW COMPARISON:  X-ray 12/05/2022. FINDINGS: Enlarged cardiopericardial silhouette. Tortuous and ectatic aorta. Vascular congestion. No pneumothorax or effusion. No edema. Overlapping cardiac leads. Curvature and degenerative changes along the spine. IMPRESSION: Enlarged heart with some vascular congestion. Tortuous ectatic aorta. Curvature and degenerative changes along the spine. Electronically Signed   By: Karen Kays M.D.   On: 09/11/2023 11:44    Time coordinating discharge: 45 mins  SIGNED:  Marlin Canary DO [facility] 09/14/23, 9:10 AM

## 2023-09-13 NOTE — Progress Notes (Signed)
   Called and gave pt's niece India update and plans for DC back to [facility] independent living. Home health PT/OT orders placed for in-house home care.  Carollee Herter, DO [facility]

## 2023-09-14 ENCOUNTER — Other Ambulatory Visit (HOSPITAL_COMMUNITY): Payer: Self-pay

## 2023-09-14 DIAGNOSIS — R251 Tremor, unspecified: Secondary | ICD-10-CM | POA: Diagnosis not present

## 2023-09-14 NOTE — TOC Transition Note (Signed)
Transition of Care [facility]) - Discharge Note   Patient Details  Name: Diane Skinner MRN: [id number] Date of Birth: 1923/01/12  Transition of Care [facility]) CM/SW Contact:  Detrice Cales A [place], Theresia Majors Phone Number: 09/14/2023, 11:00 AM   Clinical Narrative:     Patient will DC to: [facility]  Anticipated DC date: 09/14/23  Family notified: Adair Laundry  Transport by: Sharin Mons      Per MD patient ready for DC to [facility]. RN, patient, patient's family, and facility notified of DC. DC packet on chart. Ambulance transport requested for patient.     CSW will sign off for now as social work intervention is no longer needed. Please consult [place] again if new needs arise.    Final next level of care: Other (comment) (Independent Living Facility) Barriers to Discharge: Barriers Resolved   Patient Goals and CMS Choice            Discharge Placement              Patient chooses bed at:  [facility] [place])   Name of family member notified: Latonya Patient and family notified of of transfer: 09/14/23  Discharge Plan and Services Additional resources added to the After Visit Summary for   In-house Referral: Clinical Social Work   Post Acute Care Choice:  (TBD)                               Social Drivers of Health (SDOH) Interventions SDOH Screenings   Food Insecurity: No Food Insecurity (09/11/2023)  Housing: Low Risk  (09/11/2023)  Transportation Needs: No Transportation Needs (09/11/2023)  Utilities: Not At Risk (09/11/2023)  Tobacco Use: Low Risk  (09/11/2023)     Readmission Risk Interventions    09/14/2023   10:22 AM  Readmission Risk Prevention Plan  Transportation Screening Complete  PCP or Specialist Appt within 5-7 Days Complete  Home Care Screening Complete  Medication Review (RN CM) Complete

## 2023-09-14 NOTE — Progress Notes (Signed)
Physical Therapy Treatment Patient Details Name: Diane Skinner MRN: [id number] DOB: 1923-05-06 Today's Date: 09/14/2023   History of Present Illness 89 y.o. female presents to [facility] on 09/11/2023 with weakness and shakes. Lab values concerning for AKI. PMH includes HTN, CKD III, OA.    PT Comments  The pt was seen for mobility training and balance testing prior to anticipated return to her ILF. The pt was able to complete static standing activities without UE support and demos good reaching outside BOS without LOB. She continues to benefit from BUE support for ambulation, but did not need assistance this session to manage RW. Pt reports she has an aide at her ILF and can be assisted by family there as well. Can return with therapy at [facility] once medically stable.     If plan is discharge home, recommend the following: A little help with walking and/or transfers;A lot of help with bathing/dressing/bathroom;Assistance with cooking/housework;Direct supervision/assist for medications management;Direct supervision/assist for financial management;Assist for transportation;Help with stairs or ramp for entrance   Can travel by private vehicle     Yes  Equipment Recommendations  None recommended by PT    Recommendations for Other Services       Precautions / Restrictions Precautions Precautions: Fall Precaution Comments: HOH Restrictions Weight Bearing Restrictions Per Provider Order: No     Mobility  Bed Mobility Overal bed mobility: Needs Assistance Bed Mobility: Supine to Sit     Supine to sit: Min assist     General bed mobility comments: pt reaching for PT, minA through HHA, pt able to move LE without assistance    Transfers Overall transfer level: Needs assistance Equipment used: Rolling walker (2 wheels) Transfers: Sit to/from Stand Sit to Stand: Contact guard assist           General transfer comment: CGA for safety, no overt buckling or LOB     Ambulation/Gait Ambulation/Gait assistance: Contact guard assist Gait Distance (Feet): 150 Feet Assistive device: Rolling walker (2 wheels) Gait Pattern/deviations: Step-through pattern, Decreased stride length, Trunk flexed Gait velocity: reduced     General Gait Details: pt with slowed pace and slight trunk flexion. no overt LOB or need for assist with directions       Balance Overall balance assessment: Needs assistance Sitting-balance support: No upper extremity supported, Feet supported Sitting balance-Leahy Scale: Good     Standing balance support: Bilateral upper extremity supported, Reliant on assistive device for balance Standing balance-Leahy Scale: Poor Standing balance comment: can static stand without UE support, BUE support for gait                            Cognition Arousal: Alert Behavior During Therapy: WFL for tasks assessed/performed Overall Cognitive Status: No family/caregiver present to determine baseline cognitive functioning                                 General Comments: pt is alert and oriented to person only. Pt appears to have some short term memory deficits at this time, with poor ability to describer her PLOF or available caregiver support        Exercises      General Comments General comments (skin integrity, edema, etc.): VSS on RA, getting pt dressed to return to ILF      Pertinent Vitals/Pain Pain Assessment Pain Assessment: No/denies pain     PT  Goals (current goals can now be found in the care plan section) Acute Rehab PT Goals Patient Stated Goal: to improve strength, reduce falls risk PT Goal Formulation: With patient Time For Goal Achievement: 09/25/23 Potential to Achieve Goals: Fair Progress towards PT goals: Progressing toward goals    Frequency    Min 1X/week       AM-PAC PT "6 Clicks" Mobility   Outcome Measure  Help needed turning from your back to your side while in a flat  bed without using bedrails?: A Little Help needed moving from lying on your back to sitting on the side of a flat bed without using bedrails?: A Little Help needed moving to and from a bed to a chair (including a wheelchair)?: A Little Help needed standing up from a chair using your arms (e.g., wheelchair or bedside chair)?: A Little Help needed to walk in hospital room?: A Little Help needed climbing 3-5 steps with a railing? : Total 6 Click Score: 16    End of Session Equipment Utilized During Treatment: Gait belt Activity Tolerance: Patient tolerated treatment well Patient left: in bed;with call bell/phone within reach;with bed alarm set Nurse Communication: Mobility status PT Visit Diagnosis: Other abnormalities of gait and mobility (R26.89);Muscle weakness (generalized) (M62.81)     Time: 9629-5284 PT Time Calculation (min) (ACUTE ONLY): 23 min  Charges:    $Gait Training: 8-22 mins $Therapeutic Exercise: 8-22 mins PT General Charges $$ ACUTE PT VISIT: 1 Visit                     Vickki Muff, PT, DPT   Acute Rehabilitation Department Office [phone] Secure Chat Communication Preferred   Ronnie Derby 09/14/2023, 12:53 PM

## 2023-09-14 NOTE — Progress Notes (Signed)
Transition of Care [facility]) - Inpatient Brief Assessment   Patient Details  Name: ORTHA MERENESS MRN: [id number] Date of Birth: 1922-10-31  Transition of Care [facility]) CM/SW Contact:    Janae Bridgeman, RN Phone Number: 09/14/2023, 10:23 AM   Clinical Narrative: CM called and spoke with the patient's niece by phone and confirmed that niece would like the patient to return to the [facility] by PTAr.  Patient has history of Alzheimer's disease.  PTAR to be arranged.  Home health orders were placed and co-signed by the MD.  PTAR to obtain key from the front desk.  Home health will be set up through [facility].  Clinicals were faxed to [facility] to fax # [phone].  Niece will follow up as well.   Transition of Care Asessment: Insurance and Status: (P) Insurance coverage has been reviewed Patient has primary care physician: (P) Yes Home environment has been reviewed: (P) from [facility] Prior level of function:: (P) assistance at the facility Prior/Current Home Services: (P) No current home services Social Drivers of Health Review: (P) SDOH reviewed interventions complete Readmission risk has been reviewed: (P) Yes Transition of care needs: (P) transition of care needs identified, TOC will continue to follow

## 2023-09-14 NOTE — Progress Notes (Signed)
Mobility Specialist Progress Note:    09/14/23 0944  Mobility  Activity Ambulated with assistance in room;Ambulated with assistance to bathroom;Ambulated with assistance in hallway  Level of Assistance Minimal assist, patient does 75% or more  Assistive Device Front wheel walker  Distance Ambulated (ft) 24 ft  Activity Response Tolerated well  Mobility Referral Yes  Mobility visit 1 Mobility  Mobility Specialist Start Time (ACUTE ONLY) 0940  Mobility Specialist Stop Time (ACUTE ONLY) 0950  Mobility Specialist Time Calculation (min) (ACUTE ONLY) 10 min   Pt received in bed, agreeable to mobility session. Ambulated to bathroom via RW, required MinA to stand, MinG to ambulate. Void successful. Tolerated well, asx throughout. Returned pt to bed with all needs met, call bell in reach, alarm on.    Feliciana Rossetti Mobility Specialist Please contact via Special educational needs teacher or  Rehab office at [phone]

## 2023-09-14 NOTE — Plan of Care (Signed)

## 2023-10-20 ENCOUNTER — Other Ambulatory Visit (HOSPITAL_COMMUNITY): Payer: Self-pay | Admitting: Family Medicine

## 2023-10-20 DIAGNOSIS — R52 Pain, unspecified: Secondary | ICD-10-CM

## 2023-10-21 ENCOUNTER — Ambulatory Visit (HOSPITAL_COMMUNITY)
Admission: RE | Admit: 2023-10-21 | Discharge: 2023-10-21 | Disposition: A | Discharge status: Home / Self Care | Payer: Medicare PPO | Source: Ambulatory Visit | Attending: Family Medicine | Admitting: Family Medicine

## 2023-10-21 DIAGNOSIS — R52 Pain, unspecified: Secondary | ICD-10-CM | POA: Diagnosis present

## 2023-11-17 ENCOUNTER — Encounter (HOSPITAL_COMMUNITY): Payer: Self-pay | Admitting: *Deleted

## 2023-11-17 ENCOUNTER — Ambulatory Visit (INDEPENDENT_AMBULATORY_CARE_PROVIDER_SITE_OTHER): Payer: Medicare PPO

## 2023-11-17 ENCOUNTER — Ambulatory Visit (HOSPITAL_COMMUNITY)
Admission: EM | Admit: 2023-11-17 | Discharge: 2023-11-17 | Disposition: A | Discharge status: Home / Self Care | Payer: Medicare PPO

## 2023-11-17 DIAGNOSIS — R0781 Pleurodynia: Secondary | ICD-10-CM

## 2023-11-17 DIAGNOSIS — R296 Repeated falls: Secondary | ICD-10-CM | POA: Insufficient documentation

## 2023-11-17 MED ORDER — ACETAMINOPHEN 325 MG PO TABS
650.0000 mg | ORAL_TABLET | Freq: Once | ORAL | Status: AC
  Administered 2023-11-17: 650 mg via ORAL

## 2023-11-17 MED ORDER — ACETAMINOPHEN 325 MG PO TABS
ORAL_TABLET | ORAL | Status: AC
  Filled 2023-11-17: qty 2

## 2023-11-17 NOTE — ED Triage Notes (Signed)
 Patients niece states that today pt fell off a stool when sitting. She complains that she is having right back/side pain. Niece states she seems more SOB than normal. Pt states she doesn't know how long she laid there but she denies hitting her head and is unsure what she hit her right side on.   Pt states something came over her and she had a spell then she fell.

## 2023-11-17 NOTE — Discharge Instructions (Addendum)
 Your initial wet read of chest xray does not show any apparent rib fractures, please check my chart for results.  May take Tylenol as label directed for pain, also recommend lidocaine patch as label directed.   Go to the emergency room for further evaluation of unwitnessed fall, unknown cause, with EKG changes.

## 2023-11-17 NOTE — ED Provider Notes (Signed)
 [facility]    CSN: [id number] Arrival date & time: 11/17/23  1603      History   Chief Complaint Chief Complaint  Patient presents with   Fall    HPI DIYANA Skinner is a 89 y.o. female.   89 year old female, Diane Skinner, presents to urgent care with niece after falling off stool in bathroom earlier today.  Per niece fall was unwitnessed, patient stays at [facility].  Patient GCS is 15 at present, patient is complaining of right sided rib pain, and appears short of breath.  Patient reports when she fell she did not know how long she was on the bathroom floor but is pretty sure she did not hit her head, states "something came over (her) and she "had a spell" that caused her to fall out.   PMH: Alzheimer's, CKD, HTN  Pt was recently admitted 12/24 at [facility] with weakness, hypocalcemia, hyperkalemia, anemia, chronic sinus bradycardia, acute kidney injury on chronic kidney disease  The history is provided by the patient and a relative. No language interpreter was used.    Past Medical History:  Diagnosis Date   Arthritis    Hypertension    Renal disorder     Patient Active Problem List   Diagnosis Date Noted   Rib pain on right side 11/17/2023   Alzheimer's dementia without behavioral disturbance (HCC) 09/12/2023   Acute kidney injury superimposed on chronic kidney disease (HCC) - stage 4. baseline Scr 1.6-2.0 09/11/2023   Tremor 09/11/2023   Weakness 09/11/2023   Hypocalcemia 09/11/2023   Hyperkalemia 09/11/2023   Normocytic anemia 09/11/2023   Depression 09/11/2023   Pressure injury of skin 12/08/2022   Unwitnessed fall 12/05/2022   Peripheral neuropathy 12/05/2022   Sacral wound, initial encounter 04/17/2021   Chronic sinus bradycardia 04/17/2021   Thrombocytopenia (HCC) 03/15/2021   Renal disorder    Essential hypertension     Past Surgical History:  Procedure Laterality Date   BREAST SURGERY     CHOLECYSTECTOMY      OB History    No obstetric history on file.      Home Medications    Prior to Admission medications   Medication Sig Start Date End Date Taking? Authorizing Provider  amLODipine (NORVASC) 10 MG tablet TAKE 1 TABLET BY MOUTH EVERY DAY 02/25/22  Yes Ellison Carwin, MD  citalopram (CELEXA) 20 MG tablet Take 20 mg by mouth daily. 09/30/23  Yes [provider]  Cyanocobalamin (VITAMIN B12 PO) Take by mouth at bedtime.   Yes [provider]  gabapentin (NEURONTIN) 300 MG capsule Take 1 capsule (300 mg total) by mouth at bedtime. Patient taking differently: Take 600 mg by mouth at bedtime. 12/10/22  Yes Burnadette Pop, MD  hydrALAZINE (APRESOLINE) 25 MG tablet Take 1 tablet (25 mg total) by mouth every 8 (eight) hours. 09/13/23 12/12/23 Yes Carollee Herter, DO  Multiple Vitamins-Minerals (THERA-M PO) Take by mouth daily.   Yes [provider]  traZODone (DESYREL) 50 MG tablet Take 1 tablet (50 mg total) by mouth at bedtime. 12/10/22  Yes Burnadette Pop, MD  valsartan-hydrochlorothiazide (DIOVAN-HCT) 160-25 MG tablet Take 1 tablet by mouth daily. 10/26/23  Yes [provider]    Family History Family History  Problem Relation Age of Onset   Hypertension Mother    Heart disease Mother    Breast cancer Sister     Social History Social History   Tobacco Use   Smoking status: Never   Smokeless  tobacco: Never  Vaping Use   Vaping status: Never Used  Substance Use Topics   Alcohol use: Not Currently   Drug use: Not Currently     Allergies   Ibuprofen, Celexa [citalopram], and Valsartan-hydrochlorothiazide   Review of Systems Review of Systems  Constitutional:  Negative for fever.  Respiratory:         Rib pain(right sided)  Musculoskeletal:  Positive for myalgias.  All other systems reviewed and are negative.    Physical Exam Triage Vital Signs ED Triage Vitals  Encounter Vitals Group     BP 11/17/23 1755 (!) 175/81     Systolic BP Percentile --       Diastolic BP Percentile --      Pulse Rate 11/17/23 1755 (!) 50     Resp 11/17/23 1755 18     Temp 11/17/23 1755 97.8 F (36.6 C)     Temp Source 11/17/23 1755 Oral     SpO2 11/17/23 1755 93 %     Weight --      Height --      Head Circumference --      Peak Flow --      Pain Score 11/17/23 1753 8     Pain Loc --      Pain Education --      Exclude from Growth Chart --    No data found.  Updated Vital Signs BP (!) 175/81 (BP Location: Left Arm)   Pulse (!) 50   Temp 97.8 F (36.6 C) (Oral)   Resp 18   SpO2 93%   Visual Acuity Right Eye Distance:   Left Eye Distance:   Bilateral Distance:    Right Eye Near:   Left Eye Near:    Bilateral Near:     Physical Exam Vitals and nursing note reviewed.  Constitutional:      General: She is not in acute distress.    Appearance: She is well-developed and well-groomed.  HENT:     Head: Normocephalic and atraumatic.  Eyes:     Conjunctiva/sclera: Conjunctivae normal.  Cardiovascular:     Rate and Rhythm: Normal rate and regular rhythm.     Heart sounds: Normal heart sounds. No murmur heard. Pulmonary:     Effort: Pulmonary effort is normal. No respiratory distress.     Breath sounds: Normal breath sounds and air entry.  Chest:     Chest wall: Tenderness present. No crepitus.       Comments: No ecchymosis, skin intact Abdominal:     Palpations: Abdomen is soft.     Tenderness: There is no abdominal tenderness.  Musculoskeletal:        General: No swelling.     Cervical back: Neck supple.  Skin:    General: Skin is warm and dry.     Capillary Refill: Capillary refill takes less than 2 seconds.  Neurological:     General: No focal deficit present.     Mental Status: She is alert and oriented to person, place, and time.     GCS: GCS eye subscore is 4. GCS verbal subscore is 5. GCS motor subscore is 6.     Cranial Nerves: No cranial nerve deficit.     Sensory: No sensory deficit.  Psychiatric:        Attention and  Perception: Attention normal.        Mood and Affect: Mood normal.        Speech: Speech normal.  Behavior: Behavior normal. Behavior is cooperative.      UC Treatments / Results  Labs (all labs ordered are listed, but only abnormal results are displayed) Labs Reviewed - No data to display  EKG   Radiology DG Ribs Unilateral W/Chest Right Result Date: 11/17/2023 CLINICAL DATA:  Larey Seat, right back and side pain, short of breath EXAM: RIGHT RIBS AND CHEST - 3+ VIEW COMPARISON:  09/11/2023 FINDINGS: Frontal view of the chest as well as frontal and oblique views of the right thoracic cage are obtained. Stable enlargement of the cardiac silhouette. Stable thoracic aortic ectasia and atherosclerosis. Areas of linear consolidation at the lung bases, left greater than right, consistent with subsegmental atelectasis. No airspace disease, effusion, or pneumothorax. There is a minimally displaced right anterolateral tenth rib fracture. No other acute bony abnormalities. IMPRESSION: 1. Minimally displaced right anterolateral tenth rib fracture. 2. Bibasilar hypoventilatory changes. 3. No effusion or pneumothorax. Electronically Signed   By: Sharlet Salina M.D.   On: 11/17/2023 19:04    Procedures Procedures (including critical care time)  Medications Ordered in UC Medications  acetaminophen (TYLENOL) tablet 650 mg (650 mg Oral Given 11/17/23 1855)    Initial Impression / Assessment and Plan / UC Course  I have reviewed the triage vital signs and the nursing notes.  Pertinent labs & imaging results that were available during my care of the patient were reviewed by me and considered in my medical decision making (see chart for details).  Clinical Course as of 11/17/23 1928  Tue Nov 17, 2023  1833 HR 47, QTC is 417, EKG changes in lead II/V5 when compared to 09/14/2023 EKG. discussed exam findings and plan of care with patient and niece, due to past medical history and unwitnessed fall with  unknown cause recommend further ER evaluation(labs, CT,etc).  Patient and niece both verbalized understanding to this provider but want to try lidocaine patch and discuss with her MD tomorrow [JD]  1924 Xray resulted,called and spoke with Niece,LaTonya, regarding xray result(+rib fracture) and stressed if pt had worsening symptoms would need to go to ER for further evaluation. [JD]    Clinical Course User Index [JD] Algernon Mundie, Para March, NP    Ddx:Fall, rib pain, rib contusion,rib fracture Final Clinical Impressions(s) / UC Diagnoses   Final diagnoses:  Rib pain on right side  Unwitnessed fall     Discharge Instructions      Your initial wet read of chest xray does not show any apparent rib fractures, please check my chart for results.  May take Tylenol as label directed for pain, also recommend lidocaine patch as label directed.   Go to the emergency room for further evaluation of unwitnessed fall, unknown cause, with EKG changes.     ED Prescriptions   None    PDMP not reviewed this encounter.   Clancy Gourd, NP 11/17/23 [phone]

## 2023-11-17 NOTE — ED Notes (Signed)
 Patient is being discharged from the Urgent Care and sent to the Emergency Department via self . Per provider, patient is in need of higher level of care due to unwitnessed fall. Patient is aware and verbalizes understanding of plan of care.  Vitals:   11/17/23 1755  BP: (!) 175/81  Pulse: (!) 50  Resp: 18  Temp: 97.8 F (36.6 C)  SpO2: 93%

## 2023-11-22 ENCOUNTER — Other Ambulatory Visit: Payer: Self-pay

## 2023-11-22 ENCOUNTER — Emergency Department (HOSPITAL_COMMUNITY)
Admission: EM | Admit: 2023-11-22 | Discharge: 2023-11-22 | Disposition: A | Discharge status: Skilled Nursing Facility | Attending: Emergency Medicine | Admitting: Emergency Medicine

## 2023-11-22 ENCOUNTER — Encounter (HOSPITAL_COMMUNITY): Payer: Self-pay

## 2023-11-22 DIAGNOSIS — Z79899 Other long term (current) drug therapy: Secondary | ICD-10-CM | POA: Diagnosis not present

## 2023-11-22 DIAGNOSIS — R197 Diarrhea, unspecified: Secondary | ICD-10-CM | POA: Diagnosis not present

## 2023-11-22 DIAGNOSIS — R112 Nausea with vomiting, unspecified: Secondary | ICD-10-CM | POA: Diagnosis present

## 2023-11-22 LAB — RESP PANEL BY RT-PCR (RSV, FLU A&B, COVID)  RVPGX2
Influenza A by PCR: NEGATIVE
Influenza B by PCR: NEGATIVE
Resp Syncytial Virus by PCR: NEGATIVE
SARS Coronavirus 2 by RT PCR: NEGATIVE

## 2023-11-22 LAB — COMPREHENSIVE METABOLIC PANEL
ALT: 12 U/L (ref 0–44)
AST: 21 U/L (ref 15–41)
Albumin: 3.6 g/dL (ref 3.5–5.0)
Alkaline Phosphatase: 64 U/L (ref 38–126)
Anion gap: 8 (ref 5–15)
BUN: 43 mg/dL — ABNORMAL HIGH (ref 8–23)
CO2: 21 mmol/L — ABNORMAL LOW (ref 22–32)
Calcium: 8.4 mg/dL — ABNORMAL LOW (ref 8.9–10.3)
Chloride: 111 mmol/L (ref 98–111)
Creatinine, Ser: 2.1 mg/dL — ABNORMAL HIGH (ref 0.44–1.00)
GFR, Estimated: 21 mL/min — ABNORMAL LOW (ref >60)
Glucose, Bld: 93 mg/dL (ref 70–99)
Potassium: 5 mmol/L (ref 3.5–5.1)
Sodium: 140 mmol/L (ref 135–145)
Total Bilirubin: 0.7 mg/dL (ref 0.0–1.2)
Total Protein: 7.1 g/dL (ref 6.5–8.1)

## 2023-11-22 LAB — CBC WITH DIFFERENTIAL/PLATELET
Abs Immature Granulocytes: 0.03 10*3/uL (ref 0.00–0.07)
Basophils Absolute: 0 10*3/uL (ref 0.0–0.1)
Basophils Relative: 0 %
Eosinophils Absolute: 0 10*3/uL (ref 0.0–0.5)
Eosinophils Relative: 0 %
HCT: 45.9 % (ref 36.0–46.0)
Hemoglobin: 13.7 g/dL (ref 12.0–15.0)
Immature Granulocytes: 0 %
Lymphocytes Relative: 7 %
Lymphs Abs: 0.6 10*3/uL — ABNORMAL LOW (ref 0.7–4.0)
MCH: 25.1 pg — ABNORMAL LOW (ref 26.0–34.0)
MCHC: 29.8 g/dL — ABNORMAL LOW (ref 30.0–36.0)
MCV: 84.1 fL (ref 80.0–100.0)
Monocytes Absolute: 0.2 10*3/uL (ref 0.1–1.0)
Monocytes Relative: 3 %
Neutro Abs: 7.4 10*3/uL (ref 1.7–7.7)
Neutrophils Relative %: 90 %
Platelets: 147 10*3/uL — ABNORMAL LOW (ref 150–400)
RBC: 5.46 MIL/uL — ABNORMAL HIGH (ref 3.87–5.11)
RDW: 15.1 % (ref 11.5–15.5)
WBC: 8.2 10*3/uL (ref 4.0–10.5)
nRBC: 0 % (ref 0.0–0.2)

## 2023-11-22 LAB — MAGNESIUM: Magnesium: 2.5 mg/dL — ABNORMAL HIGH (ref 1.7–2.4)

## 2023-11-22 LAB — TROPONIN I (HIGH SENSITIVITY): Troponin I (High Sensitivity): 13 ng/L (ref <18)

## 2023-11-22 MED ORDER — DICYCLOMINE HCL 10 MG/ML IM SOLN
20.0000 mg | Freq: Once | INTRAMUSCULAR | Status: AC
  Administered 2023-11-22: 20 mg via INTRAMUSCULAR
  Filled 2023-11-22: qty 2

## 2023-11-22 MED ORDER — ONDANSETRON HCL 4 MG/2ML IJ SOLN
4.0000 mg | Freq: Once | INTRAMUSCULAR | Status: AC
  Administered 2023-11-22: 4 mg via INTRAVENOUS
  Filled 2023-11-22: qty 2

## 2023-11-22 MED ORDER — DICYCLOMINE HCL 20 MG PO TABS: 20.0000 mg | ORAL_TABLET | Freq: Two times a day (BID) | ORAL | 0 refills | Status: DC | PRN

## 2023-11-22 MED ORDER — SODIUM CHLORIDE 0.9 % IV BOLUS
1000.0000 mL | Freq: Once | INTRAVENOUS | Status: AC
  Administered 2023-11-22: 1000 mL via INTRAVENOUS

## 2023-11-22 MED ORDER — ONDANSETRON HCL 4 MG PO TABS: 4.0000 mg | ORAL_TABLET | Freq: Three times a day (TID) | ORAL | 0 refills | Status: DC | PRN

## 2023-11-22 NOTE — ED Provider Notes (Signed)
 [facility] AT [facility] Provider Note   CSN: [id number] Arrival date & time: 11/22/23  1605     History  Chief Complaint  Patient presents with   Nausea   Emesis   Diarrhea    Diane Skinner is a 89 y.o. female presenting from nursing facility with nausea vomiting and diarrhea.  Per EMS report there is a suspected viral illness, potential norovirus "going around the facility".  Patient is hard of hearing but does not have any acute complaints.  She denies abdominal pain or dizziness, sore throat or headache to me.  HPI     Home Medications Prior to Admission medications   Medication Sig Start Date End Date Taking? Authorizing Provider  dicyclomine (BENTYL) 20 MG tablet Take 1 tablet (20 mg total) by mouth 2 (two) times daily as needed for up to 10 doses for spasms. Cramping abdominal pain and diarrhea 11/22/23  Yes Shona Pardo, Kermit Balo, MD  ondansetron (ZOFRAN) 4 MG tablet Take 1 tablet (4 mg total) by mouth every 8 (eight) hours as needed for up to 15 doses for nausea or vomiting. 11/22/23  Yes Terald Sleeper, MD  amLODipine (NORVASC) 10 MG tablet TAKE 1 TABLET BY MOUTH EVERY DAY 02/25/22   Ellison Carwin, MD  citalopram (CELEXA) 20 MG tablet Take 20 mg by mouth daily. 09/30/23   [provider]  Cyanocobalamin (VITAMIN B12 PO) Take by mouth at bedtime.    [provider]  gabapentin (NEURONTIN) 300 MG capsule Take 1 capsule (300 mg total) by mouth at bedtime. Patient taking differently: Take 600 mg by mouth at bedtime. 12/10/22   Burnadette Pop, MD  hydrALAZINE (APRESOLINE) 25 MG tablet Take 1 tablet (25 mg total) by mouth every 8 (eight) hours. 09/13/23 12/12/23  Carollee Herter, DO  Multiple Vitamins-Minerals (THERA-M PO) Take by mouth daily.    [provider]  traZODone (DESYREL) 50 MG tablet Take 1 tablet (50 mg total) by mouth at bedtime. 12/10/22   Burnadette Pop, MD  valsartan-hydrochlorothiazide (DIOVAN-HCT) 160-25 MG  tablet Take 1 tablet by mouth daily. 10/26/23   [provider]      Allergies    Ibuprofen, Celexa [citalopram], and Valsartan-hydrochlorothiazide    Review of Systems   Review of Systems  Physical Exam Updated Vital Signs BP 127/74   Pulse 78   Temp (!) 97.4 F (36.3 C) (Oral)   Resp 17   SpO2 100%  Physical Exam Constitutional:      General: She is not in acute distress.    Comments: Hard of hearing  HENT:     Head: Normocephalic and atraumatic.  Eyes:     Conjunctiva/sclera: Conjunctivae normal.     Pupils: Pupils are equal, round, and reactive to light.  Cardiovascular:     Rate and Rhythm: Normal rate and regular rhythm.  Pulmonary:     Effort: Pulmonary effort is normal. No respiratory distress.  Abdominal:     General: There is no distension.     Tenderness: There is no abdominal tenderness.  Skin:    General: Skin is warm and dry.  Neurological:     General: No focal deficit present.     Mental Status: She is alert. Mental status is at baseline.  Psychiatric:        Mood and Affect: Mood normal.        Behavior: Behavior normal.     ED Results / Procedures / Treatments   Labs (all labs  ordered are listed, but only abnormal results are displayed) Labs Reviewed  CBC WITH DIFFERENTIAL/PLATELET - Abnormal; Notable for the following components:      Result Value   RBC 5.46 (*)    MCH 25.1 (*)    MCHC 29.8 (*)    Platelets 147 (*)    Lymphs Abs 0.6 (*)    All other components within normal limits  COMPREHENSIVE METABOLIC PANEL - Abnormal; Notable for the following components:   CO2 21 (*)    BUN 43 (*)    Creatinine, Ser 2.10 (*)    Calcium 8.4 (*)    GFR, Estimated 21 (*)    All other components within normal limits  MAGNESIUM - Abnormal; Notable for the following components:   Magnesium 2.5 (*)    All other components within normal limits  RESP PANEL BY RT-PCR (RSV, FLU A&B, COVID)  RVPGX2  TROPONIN I (HIGH SENSITIVITY)    EKG EKG  Interpretation Date/Time:  Sunday November 22 2023 17:17:25 EST Ventricular Rate:  60 PR Interval:  158 QRS Duration:  102 QT Interval:  435 QTC Calculation: 435 R Axis:   -55  Text Interpretation: Sinus rhythm LAD, consider left anterior fascicular block Confirmed by Alvester Chou [phone]) on 11/22/2023 5:24:45 PM  Radiology No results found.  Procedures Procedures    Medications Ordered in ED Medications  sodium chloride 0.9 % bolus 1,000 mL (0 mLs Intravenous Stopped 11/22/23 2023)  ondansetron (ZOFRAN) injection 4 mg (4 mg Intravenous Given 11/22/23 1722)  dicyclomine (BENTYL) injection 20 mg (20 mg Intramuscular Given by Other 11/22/23 1724)    ED Course/ Medical Decision Making/ A&P Clinical Course as of 11/22/23 2234  Sun Nov 22, 2023  1858 Labs hemolyzed or lost - resent by paramedic provider  [MT]  2002 Labs are largely unremarkable.  Creatinine near baseline levels.  Patient did receive some fluids, Zofran and Bentyl.  Will attempt p.o. challenge [MT]  2025 Patient was easily tolerating p.o. here in the ED.  I do not see any emergent findings on her blood work.  She is stable for discharge back to her facility.  Suspect likely viral gastroenteritis [MT]    Clinical Course User Index [MT] Caddie Randle, Kermit Balo, MD                                 Medical Decision Making Amount and/or Complexity of Data Reviewed Labs: ordered. ECG/medicine tests: ordered.  Risk Prescription drug management.   Patient is here with concern for nausea, vomiting diarrhea, diminished appetite.  Differential is broad but include viral gastroenteritis versus foodborne illness versus atypical ACS versus other.  She does not have any active complaints of chest pain, abdominal pain.  There is initial report of hypoxia but on my assessment the patient is quite stable on room air, and I suspect this was an erroneous reading.  She is not requiring supplemental oxygen and is not have any respiratory  symptoms.  Supplemental history provided by EMS.  I personally reviewed and interpreted the patient's labs, notable for no emergent findings.  Patient's labs are baseline levels including chronic kidney disease.  She has no leukocytosis or acute anemia.  Electrolytes are grossly unremarkable.  Flu and COVID test are negative.  Troponin negative.  I have a lower suspicion for ACS, AAA, mesenteric ischemia, or other life-threatening emergency.  IV fluids, Bentyl and Zofran were ordered for symptomatic control.  On reassessment  patient was easily able to tolerate food and fluid.  She has not had significant or copious diarrhea in the ED.  Low suspicion for C. difficile.        Final Clinical Impression(s) / ED Diagnoses Final diagnoses:  Nausea vomiting and diarrhea    Rx / DC Orders ED Discharge Orders          Ordered    ondansetron (ZOFRAN) 4 MG tablet  Every 8 hours PRN        11/22/23 2025    dicyclomine (BENTYL) 20 MG tablet  2 times daily PRN        11/22/23 2025              Terald Sleeper, MD 11/22/23 2235

## 2023-11-22 NOTE — ED Triage Notes (Addendum)
 Pt BIB EMS from [facility] due to nausea, vomiting, and diarrhea. Facility reports norovirus is going around the facility. Baseline AAOx4. Pt is hard of hearing; pt wears hearing aids in both ears.  BP 130/64 CBG 141 20ga IV LAC 100 mL LR bolus

## 2023-11-22 NOTE — ED Notes (Signed)
 Pt passed PO challenge. Pt denied any current abdominal pain or nausea.

## 2023-11-22 NOTE — Discharge Instructions (Addendum)
 Diane Skinner's blood tests did not show signs of dangerous infection or dehydration.  She was given IV fluids, Zofran for nausea and Bentyl for cramping abdominal pain.  She was easily able to eat and drink here in the emergency department was not having any other complaints.  She may have a viral illness, which typically last about 3 to 5 days.  I prescribed her Zofran and Bentyl to use as needed for nausea and cramping abdominal pain or diarrhea for the next 3 to 5 days.  Please arrange to have her seen by her primary care provider or the nursing facility provider this week.  Thank you.

## 2023-11-22 NOTE — ED Notes (Signed)
 Pt niece called. She asked to be contacted with pt updates and be informed if pt was to be admitted. Her # listed in pt's contact list.

## 2024-02-02 ENCOUNTER — Encounter (HOSPITAL_COMMUNITY): Payer: Self-pay

## 2024-02-02 ENCOUNTER — Other Ambulatory Visit: Payer: Self-pay

## 2024-02-02 ENCOUNTER — Emergency Department (HOSPITAL_COMMUNITY)
Admission: EM | Admit: 2024-02-02 | Discharge: 2024-02-03 | Disposition: A | Discharge status: Home / Self Care | Attending: Emergency Medicine | Admitting: Emergency Medicine

## 2024-02-02 ENCOUNTER — Emergency Department (HOSPITAL_COMMUNITY)

## 2024-02-02 DIAGNOSIS — I10 Essential (primary) hypertension: Secondary | ICD-10-CM | POA: Insufficient documentation

## 2024-02-02 DIAGNOSIS — R059 Cough, unspecified: Secondary | ICD-10-CM | POA: Diagnosis not present

## 2024-02-02 DIAGNOSIS — R350 Frequency of micturition: Secondary | ICD-10-CM | POA: Diagnosis present

## 2024-02-02 DIAGNOSIS — N3001 Acute cystitis with hematuria: Secondary | ICD-10-CM

## 2024-02-02 DIAGNOSIS — Z79899 Other long term (current) drug therapy: Secondary | ICD-10-CM | POA: Insufficient documentation

## 2024-02-02 DIAGNOSIS — G309 Alzheimer's disease, unspecified: Secondary | ICD-10-CM | POA: Diagnosis not present

## 2024-02-02 LAB — CBC WITH DIFFERENTIAL/PLATELET
Abs Immature Granulocytes: 0.02 10*3/uL (ref 0.00–0.07)
Basophils Absolute: 0 10*3/uL (ref 0.0–0.1)
Basophils Relative: 1 %
Eosinophils Absolute: 0 10*3/uL (ref 0.0–0.5)
Eosinophils Relative: 0 %
HCT: 38.9 % (ref 36.0–46.0)
Hemoglobin: 11.8 g/dL — ABNORMAL LOW (ref 12.0–15.0)
Immature Granulocytes: 0 %
Lymphocytes Relative: 34 %
Lymphs Abs: 2.3 10*3/uL (ref 0.7–4.0)
MCH: 25.8 pg — ABNORMAL LOW (ref 26.0–34.0)
MCHC: 30.3 g/dL (ref 30.0–36.0)
MCV: 84.9 fL (ref 80.0–100.0)
Monocytes Absolute: 0.5 10*3/uL (ref 0.1–1.0)
Monocytes Relative: 8 %
Neutro Abs: 3.8 10*3/uL (ref 1.7–7.7)
Neutrophils Relative %: 57 %
Platelets: 155 10*3/uL (ref 150–400)
RBC: 4.58 MIL/uL (ref 3.87–5.11)
RDW: 15.4 % (ref 11.5–15.5)
WBC: 6.6 10*3/uL (ref 4.0–10.5)
nRBC: 0 % (ref 0.0–0.2)

## 2024-02-02 LAB — BASIC METABOLIC PANEL WITH GFR
Anion gap: 7 (ref 5–15)
BUN: 29 mg/dL — ABNORMAL HIGH (ref 8–23)
CO2: 24 mmol/L (ref 22–32)
Calcium: 8.7 mg/dL — ABNORMAL LOW (ref 8.9–10.3)
Chloride: 107 mmol/L (ref 98–111)
Creatinine, Ser: 2.02 mg/dL — ABNORMAL HIGH (ref 0.44–1.00)
GFR, Estimated: 22 mL/min — ABNORMAL LOW (ref >60)
Glucose, Bld: 96 mg/dL (ref 70–99)
Potassium: 4.8 mmol/L (ref 3.5–5.1)
Sodium: 138 mmol/L (ref 135–145)

## 2024-02-02 LAB — RESP PANEL BY RT-PCR (RSV, FLU A&B, COVID)  RVPGX2
Influenza A by PCR: NEGATIVE
Influenza B by PCR: NEGATIVE
Resp Syncytial Virus by PCR: NEGATIVE
SARS Coronavirus 2 by RT PCR: NEGATIVE

## 2024-02-02 NOTE — ED Triage Notes (Signed)
 Pt bib ems for arm tingling that has been going on of a while. Pins and needle feeling has been on and off. Per ems pt. Has also had urinary frequency and a cough x5 days.

## 2024-02-02 NOTE — ED Provider Notes (Signed)
 [place] EMERGENCY DEPARTMENT AT [facility] Provider Note   CSN: [id number] Arrival date & time: 02/02/24  2201     History {Add pertinent medical, surgical, social history, OB history to HPI:1} Chief Complaint  Patient presents with   Numbness    Diane Skinner is a 89 y.o. female.  The history is provided by the patient and medical records.   Level 5 caveat: Dementia  89 year old female with history of arthritis, hypertension, anemia, Alzheimer's dementia, presenting to the ED via EMS.  Patient apparently called EMS as she was complaining of right arm pain.  Apparently this has been going on for quite some time.  EMS was able to speak with patient's niece via phone--she has been complaining about this for months.  She was actually seen by her primary care doctor yesterday for a full physical without any abnormal findings.  Patient urinated while EMS was there and states that resolved her arm pain.  She did urinate 2 additional times prior to them leaving home.  Has also been having a cough for the past few days, placed on some meds by PCP for this.  Denies fever.  No chest pain or SOB.  At baseline per niece.    Home Medications Prior to Admission medications   Medication Sig Start Date End Date Taking? Authorizing Provider  amLODipine  (NORVASC ) 10 MG tablet TAKE 1 TABLET BY MOUTH EVERY DAY 02/25/22   Zinoviev, Eva, MD  citalopram  (CELEXA ) 20 MG tablet Take 20 mg by mouth daily. 09/30/23   [provider]  Cyanocobalamin  (VITAMIN B12 PO) Take by mouth at bedtime.    [provider]  dicyclomine  (BENTYL ) 20 MG tablet Take 1 tablet (20 mg total) by mouth 2 (two) times daily as needed for up to 10 doses for spasms. Cramping abdominal pain and diarrhea 11/22/23   Arvilla Birmingham, MD  gabapentin  (NEURONTIN ) 300 MG capsule Take 1 capsule (300 mg total) by mouth at bedtime. Patient taking differently: Take 600 mg by mouth at bedtime. 12/10/22   Leona Rake,  MD  hydrALAZINE  (APRESOLINE ) 25 MG tablet Take 1 tablet (25 mg total) by mouth every 8 (eight) hours. 09/13/23 12/12/23  Unk Garb, DO  Multiple Vitamins-Minerals (THERA-M PO) Take by mouth daily.    [provider]  ondansetron  (ZOFRAN ) 4 MG tablet Take 1 tablet (4 mg total) by mouth every 8 (eight) hours as needed for up to 15 doses for nausea or vomiting. 11/22/23   Arvilla Birmingham, MD  traZODone  (DESYREL ) 50 MG tablet Take 1 tablet (50 mg total) by mouth at bedtime. 12/10/22   Adhikari, Amrit, MD  valsartan-hydrochlorothiazide (DIOVAN-HCT) 160-25 MG tablet Take 1 tablet by mouth daily. 10/26/23   [provider]      Allergies    Ibuprofen, Celexa  [citalopram ], and Valsartan-hydrochlorothiazide    Review of Systems   Review of Systems  Unable to perform ROS: Dementia    Physical Exam Updated Vital Signs Temp 98.2 F (36.8 C) (Oral)   Physical Exam Vitals and nursing note reviewed.  Constitutional:      Appearance: She is well-developed.  HENT:     Head: Normocephalic and atraumatic.  Eyes:     Conjunctiva/sclera: Conjunctivae normal.     Pupils: Pupils are equal, round, and reactive to light.  Cardiovascular:     Rate and Rhythm: Normal rate and regular rhythm.     Heart sounds: Normal heart sounds.  Pulmonary:     Effort: Pulmonary effort  is normal.     Breath sounds: Rhonchi present.     Comments: Wet cough noted on exam, has some slight rhonchi on exam in LUL, but NAD Abdominal:     General: Bowel sounds are normal.     Palpations: Abdomen is soft.     Tenderness: There is no abdominal tenderness.     Comments: Soft, non-tender-- after exam complained that she felt like she needed to urinate again  Musculoskeletal:        General: Normal range of motion.     Cervical back: Normal range of motion.     Comments: Right arm is normal in appearance without swelling or bony deformity, able to move arm without apparent difficulty, normal grip strength,  normal sensation  Skin:    General: Skin is warm and dry.  Neurological:     Mental Status: She is alert and oriented to person, place, and time.     ED Results / Procedures / Treatments   Labs (all labs ordered are listed, but only abnormal results are displayed) Labs Reviewed  RESP PANEL BY RT-PCR (RSV, FLU A&B, COVID)  RVPGX2  CBC WITH DIFFERENTIAL/PLATELET  BASIC METABOLIC PANEL WITH GFR  URINALYSIS, W/ REFLEX TO CULTURE (INFECTION SUSPECTED)    EKG None  Radiology No results found.  Procedures Procedures  {Document cardiac monitor, telemetry assessment procedure when appropriate:1}  Medications Ordered in ED Medications - No data to display  ED Course/ Medical Decision Making/ A&P   {   Click here for ABCD2, HEART and other calculatorsREFRESH Note before signing :1}                              Medical Decision Making Amount and/or Complexity of Data Reviewed Labs: ordered. Radiology: ordered.    Final Clinical Impression(s) / ED Diagnoses Final diagnoses:  None    Rx / DC Orders ED Discharge Orders     None

## 2024-02-03 ENCOUNTER — Emergency Department (HOSPITAL_COMMUNITY)

## 2024-02-03 LAB — URINALYSIS, W/ REFLEX TO CULTURE (INFECTION SUSPECTED)
Bacteria, UA: NONE SEEN
Bilirubin Urine: NEGATIVE
Glucose, UA: NEGATIVE mg/dL
Ketones, ur: NEGATIVE mg/dL
Nitrite: NEGATIVE
Protein, ur: 30 mg/dL — AB
RBC / HPF: >50 RBC/hpf (ref 0–5)
Specific Gravity, Urine: 1.005 (ref 1.005–1.030)
WBC, UA: >50 WBC/hpf (ref 0–5)
pH: 8 (ref 5.0–8.0)

## 2024-02-03 MED ORDER — CEPHALEXIN 500 MG PO CAPS: 500.0000 mg | ORAL_CAPSULE | Freq: Two times a day (BID) | ORAL | 0 refills | Status: DC

## 2024-02-03 MED ORDER — SODIUM CHLORIDE 0.9 % IV SOLN
1.0000 g | Freq: Once | INTRAVENOUS | Status: AC
  Administered 2024-02-03: 1 g via INTRAVENOUS
  Filled 2024-02-03: qty 10

## 2024-02-03 NOTE — ED Notes (Signed)
 [id number] non-emergent called for PTAR.

## 2024-02-03 NOTE — ED Notes (Signed)
 Called pt's Niece and provided her with pt care summary and discharge instructions and informed her of pt discharged with PTAR. She had no further questions or concerns at this time.

## 2024-02-03 NOTE — Discharge Instructions (Addendum)
 Labs today were reassuring but was found to have UTI. Take the prescribed medication as directed. Follow-up with your primary care doctor. Return to the ED for new or worsening symptoms.

## 2024-02-05 LAB — URINE CULTURE: Culture: >=100000 — AB

## 2024-02-06 ENCOUNTER — Telehealth (HOSPITAL_BASED_OUTPATIENT_CLINIC_OR_DEPARTMENT_OTHER): Payer: Self-pay | Admitting: *Deleted

## 2024-02-06 NOTE — Telephone Encounter (Signed)
 Post ED Visit - Positive Culture Follow-up  Culture report reviewed by antimicrobial stewardship pharmacist: [facility] []  Court Distance, Pharm.D. []  Skeet Duke, Pharm.D., BCPS AQ-ID []  Leslee Rase, Pharm.D., BCPS []  Garland Junk, [address].D., BCPS []  [place], [address].D., BCPS, AAHIVP []  Alcide Aly, Pharm.D., BCPS, AAHIVP []  Jerri Morale, PharmD, BCPS []  Graham Laws, PharmD, BCPS []  Cleda Curly, PharmD, BCPS []  Tamar Fairly, PharmD []  Ballard Levels, PharmD, BCPS []  Ollen Beverage, PharmD  [facility] [x]  Arlyne Bering, PharmD []  Sherryle Don, PharmD []  Van Gelinas, PharmD []  Delila Felty, Rph []  [place]) Cleora Daft, PharmD []  Augustina Block, PharmD []  Arie Kurtz, PharmD []  Sharlyn Deaner, PharmD []  Agnes Hose, PharmD []  Kendall Pauls, PharmD []  Gladstone Lamer, PharmD []  Armanda Bern, PharmD []  Tera Fellows, PharmD   Positive urine culture Treated with Cephalexin , organism sensitive to the same and no further patient follow-up is required at this time.  Georgine Kitchens 02/06/2024, 11:57 AM

## 2024-03-29 ENCOUNTER — Other Ambulatory Visit: Payer: Self-pay

## 2024-03-29 ENCOUNTER — Inpatient Hospital Stay (HOSPITAL_COMMUNITY)
Admission: EM | Admit: 2024-03-29 | Discharge: 2024-04-04 | DRG: 682 | Disposition: A | Discharge status: Skilled Nursing Facility | Source: Skilled Nursing Facility | Attending: Internal Medicine | Admitting: Internal Medicine

## 2024-03-29 ENCOUNTER — Observation Stay (HOSPITAL_COMMUNITY)

## 2024-03-29 DIAGNOSIS — D649 Anemia, unspecified: Secondary | ICD-10-CM | POA: Diagnosis present

## 2024-03-29 DIAGNOSIS — A084 Viral intestinal infection, unspecified: Secondary | ICD-10-CM | POA: Diagnosis present

## 2024-03-29 DIAGNOSIS — Z79899 Other long term (current) drug therapy: Secondary | ICD-10-CM

## 2024-03-29 DIAGNOSIS — Z888 Allergy status to other drugs, medicaments and biological substances status: Secondary | ICD-10-CM

## 2024-03-29 DIAGNOSIS — N179 Acute kidney failure, unspecified: Principal | ICD-10-CM | POA: Diagnosis present

## 2024-03-29 DIAGNOSIS — Z8249 Family history of ischemic heart disease and other diseases of the circulatory system: Secondary | ICD-10-CM

## 2024-03-29 DIAGNOSIS — F05 Delirium due to known physiological condition: Secondary | ICD-10-CM | POA: Diagnosis not present

## 2024-03-29 DIAGNOSIS — E8721 Acute metabolic acidosis: Secondary | ICD-10-CM | POA: Diagnosis present

## 2024-03-29 DIAGNOSIS — N184 Chronic kidney disease, stage 4 (severe): Secondary | ICD-10-CM | POA: Diagnosis present

## 2024-03-29 DIAGNOSIS — Z1152 Encounter for screening for COVID-19: Secondary | ICD-10-CM

## 2024-03-29 DIAGNOSIS — G309 Alzheimer's disease, unspecified: Secondary | ICD-10-CM | POA: Diagnosis present

## 2024-03-29 DIAGNOSIS — I10 Essential (primary) hypertension: Secondary | ICD-10-CM | POA: Diagnosis present

## 2024-03-29 DIAGNOSIS — R197 Diarrhea, unspecified: Secondary | ICD-10-CM | POA: Diagnosis present

## 2024-03-29 DIAGNOSIS — J029 Acute pharyngitis, unspecified: Principal | ICD-10-CM | POA: Diagnosis present

## 2024-03-29 DIAGNOSIS — E860 Dehydration: Secondary | ICD-10-CM | POA: Diagnosis present

## 2024-03-29 DIAGNOSIS — Z7409 Other reduced mobility: Secondary | ICD-10-CM | POA: Diagnosis present

## 2024-03-29 DIAGNOSIS — Z9049 Acquired absence of other specified parts of digestive tract: Secondary | ICD-10-CM

## 2024-03-29 DIAGNOSIS — F0280 Dementia in other diseases classified elsewhere without behavioral disturbance: Secondary | ICD-10-CM | POA: Diagnosis present

## 2024-03-29 DIAGNOSIS — G9341 Metabolic encephalopathy: Secondary | ICD-10-CM | POA: Diagnosis present

## 2024-03-29 DIAGNOSIS — Z886 Allergy status to analgesic agent status: Secondary | ICD-10-CM

## 2024-03-29 DIAGNOSIS — G629 Polyneuropathy, unspecified: Secondary | ICD-10-CM | POA: Diagnosis present

## 2024-03-29 DIAGNOSIS — E440 Moderate protein-calorie malnutrition: Secondary | ICD-10-CM | POA: Diagnosis present

## 2024-03-29 DIAGNOSIS — D631 Anemia in chronic kidney disease: Secondary | ICD-10-CM | POA: Diagnosis present

## 2024-03-29 DIAGNOSIS — Z681 Body mass index (BMI) 19 or less, adult: Secondary | ICD-10-CM

## 2024-03-29 DIAGNOSIS — N189 Chronic kidney disease, unspecified: Secondary | ICD-10-CM | POA: Diagnosis present

## 2024-03-29 DIAGNOSIS — I129 Hypertensive chronic kidney disease with stage 1 through stage 4 chronic kidney disease, or unspecified chronic kidney disease: Secondary | ICD-10-CM | POA: Diagnosis present

## 2024-03-29 DIAGNOSIS — L89326 Pressure-induced deep tissue damage of left buttock: Secondary | ICD-10-CM | POA: Diagnosis present

## 2024-03-29 LAB — CBC WITH DIFFERENTIAL/PLATELET
Abs Immature Granulocytes: 0.02 K/uL (ref 0.00–0.07)
Basophils Absolute: 0 K/uL (ref 0.0–0.1)
Basophils Relative: 1 %
Eosinophils Absolute: 0 K/uL (ref 0.0–0.5)
Eosinophils Relative: 0 %
HCT: 35.9 % — ABNORMAL LOW (ref 36.0–46.0)
Hemoglobin: 11.5 g/dL — ABNORMAL LOW (ref 12.0–15.0)
Immature Granulocytes: 0 %
Lymphocytes Relative: 35 %
Lymphs Abs: 2.3 K/uL (ref 0.7–4.0)
MCH: 26.3 pg (ref 26.0–34.0)
MCHC: 32 g/dL (ref 30.0–36.0)
MCV: 82 fL (ref 80.0–100.0)
Monocytes Absolute: 0.6 K/uL (ref 0.1–1.0)
Monocytes Relative: 9 %
Neutro Abs: 3.6 K/uL (ref 1.7–7.7)
Neutrophils Relative %: 55 %
Platelets: 148 K/uL — ABNORMAL LOW (ref 150–400)
RBC: 4.38 MIL/uL (ref 3.87–5.11)
RDW: 14.4 % (ref 11.5–15.5)
WBC: 6.5 K/uL (ref 4.0–10.5)
nRBC: 0 % (ref 0.0–0.2)

## 2024-03-29 LAB — COMPREHENSIVE METABOLIC PANEL WITH GFR
ALT: 8 U/L (ref 0–44)
AST: 16 U/L (ref 15–41)
Albumin: 3.6 g/dL (ref 3.5–5.0)
Alkaline Phosphatase: 62 U/L (ref 38–126)
Anion gap: 12 (ref 5–15)
BUN: 63 mg/dL — ABNORMAL HIGH (ref 8–23)
CO2: 16 mmol/L — ABNORMAL LOW (ref 22–32)
Calcium: 8.4 mg/dL — ABNORMAL LOW (ref 8.9–10.3)
Chloride: 108 mmol/L (ref 98–111)
Creatinine, Ser: 4.06 mg/dL — ABNORMAL HIGH (ref 0.44–1.00)
GFR, Estimated: 9 mL/min — ABNORMAL LOW (ref >60)
Glucose, Bld: 97 mg/dL (ref 70–99)
Potassium: 4.3 mmol/L (ref 3.5–5.1)
Sodium: 136 mmol/L (ref 135–145)
Total Bilirubin: 1.1 mg/dL (ref 0.0–1.2)
Total Protein: 6.9 g/dL (ref 6.5–8.1)

## 2024-03-29 LAB — RESP PANEL BY RT-PCR (RSV, FLU A&B, COVID)  RVPGX2
Influenza A by PCR: NEGATIVE
Influenza B by PCR: NEGATIVE
Resp Syncytial Virus by PCR: NEGATIVE
SARS Coronavirus 2 by RT PCR: NEGATIVE

## 2024-03-29 LAB — GROUP A STREP BY PCR: Group A Strep by PCR: NOT DETECTED

## 2024-03-29 MED ORDER — ALBUTEROL SULFATE (2.5 MG/3ML) 0.083% IN NEBU: 2.5000 mg | INHALATION_SOLUTION | Freq: Four times a day (QID) | RESPIRATORY_TRACT | Status: DC

## 2024-03-29 MED ORDER — SODIUM CHLORIDE 0.9 % IV SOLN: INTRAVENOUS | Status: DC

## 2024-03-29 MED ORDER — SODIUM CHLORIDE 0.9 % IV BOLUS
500.0000 mL | Freq: Once | INTRAVENOUS | Status: AC
  Administered 2024-03-29: 500 mL via INTRAVENOUS

## 2024-03-29 MED ORDER — TRAZODONE HCL 100 MG PO TABS
50.0000 mg | ORAL_TABLET | Freq: Every day | ORAL | Status: DC
  Administered 2024-03-30: 50 mg via ORAL
  Filled 2024-03-29: qty 1

## 2024-03-29 MED ORDER — CITALOPRAM HYDROBROMIDE 10 MG PO TABS: 20.0000 mg | ORAL_TABLET | Freq: Every day | ORAL | Status: DC

## 2024-03-29 MED ORDER — ONDANSETRON HCL 4 MG/2ML IJ SOLN: 4.0000 mg | Freq: Four times a day (QID) | INTRAMUSCULAR | Status: DC | PRN

## 2024-03-29 MED ORDER — ACETAMINOPHEN 325 MG PO TABS: 650.0000 mg | ORAL_TABLET | Freq: Four times a day (QID) | ORAL | Status: DC | PRN

## 2024-03-29 MED ORDER — HYDROCODONE-ACETAMINOPHEN 5-325 MG PO TABS: 1.0000 | ORAL_TABLET | ORAL | Status: DC | PRN

## 2024-03-29 MED ORDER — ONDANSETRON HCL 4 MG PO TABS
4.0000 mg | ORAL_TABLET | Freq: Four times a day (QID) | ORAL | Status: DC | PRN
Start: 2024-03-29 — End: 2024-04-04

## 2024-03-29 MED ORDER — ACETAMINOPHEN 650 MG RE SUPP: 650.0000 mg | Freq: Four times a day (QID) | RECTAL | Status: DC | PRN

## 2024-03-29 NOTE — ED Triage Notes (Signed)
 Pt brought to the ED from a facility for having a sore throat x1 week. Pt also complaining of diarrhea. Pt states that she started feeling bad this morning and felt that it was time to get checked out. Pt is alert and oriented x4.

## 2024-03-29 NOTE — Subjective & Objective (Signed)
 Patient coming from her assisted living facility with sore throat for the past 1 week diarrhea Is been more fatigued lately no chest pain or shortness of breath no cough

## 2024-03-29 NOTE — ED Provider Notes (Signed)
  EMERGENCY DEPARTMENT AT [facility] Provider Note   CSN: [id number] Arrival date & time: 03/29/24  1738     Patient presents with: Sore Throat   Diane Skinner is a 89 y.o. female who presents with concerns of sore throat as well as multiple episodes of loose stools.  As noted she lives in an assisted living facility, brought to the ED by EMS.  States that she has been feeling unwell over the last week however today felt that since it did not improve she would seek evaluation.  She has a previous medical diagnosis of primary hypertension, Alzheimer's dementia, peripheral neuropathy.  Denies any chest pain, shortness of breath, denies any cough, denies having any rhinorrhea.    Sore Throat       Prior to Admission medications   Medication Sig Start Date End Date Taking? Authorizing Provider  amLODipine  (NORVASC ) 10 MG tablet TAKE 1 TABLET BY MOUTH EVERY DAY 02/25/22   Zinoviev, Eva, MD  cephALEXin  (KEFLEX ) 500 MG capsule Take 1 capsule (500 mg total) by mouth 2 (two) times daily. 02/03/24   Jarold Olam HERO, PA-C  citalopram  (CELEXA ) 20 MG tablet Take 20 mg by mouth daily. 09/30/23   [provider]  Cyanocobalamin  (VITAMIN B12 PO) Take by mouth at bedtime.    [provider]  dicyclomine  (BENTYL ) 20 MG tablet Take 1 tablet (20 mg total) by mouth 2 (two) times daily as needed for up to 10 doses for spasms. Cramping abdominal pain and diarrhea 11/22/23   Cottie Donnice PARAS, MD  gabapentin  (NEURONTIN ) 300 MG capsule Take 1 capsule (300 mg total) by mouth at bedtime. Patient taking differently: Take 600 mg by mouth at bedtime. 12/10/22   Jillian Buttery, MD  hydrALAZINE  (APRESOLINE ) 25 MG tablet Take 1 tablet (25 mg total) by mouth every 8 (eight) hours. 09/13/23 12/12/23  Laurence Locus, DO  Multiple Vitamins-Minerals (THERA-M PO) Take by mouth daily.    [provider]  ondansetron  (ZOFRAN ) 4 MG tablet Take 1 tablet (4 mg total) by mouth every 8  (eight) hours as needed for up to 15 doses for nausea or vomiting. 11/22/23   Cottie Donnice PARAS, MD  traZODone  (DESYREL ) 50 MG tablet Take 1 tablet (50 mg total) by mouth at bedtime. 12/10/22   Adhikari, Amrit, MD  valsartan-hydrochlorothiazide (DIOVAN-HCT) 160-25 MG tablet Take 1 tablet by mouth daily. 10/26/23   [provider]    Allergies: Ibuprofen, Celexa  [citalopram ], and Valsartan-hydrochlorothiazide    Review of Systems  HENT:  Positive for sore throat.   Gastrointestinal:  Positive for diarrhea.  All other systems reviewed and are negative.   Updated Vital Signs BP (!) 94/52   Pulse 63   Temp (!) 97.1 F (36.2 C) (Oral)   Resp 18   Ht 5' 5 (1.651 m)   Wt 55 kg   SpO2 98%   BMI 20.18 kg/m   Physical Exam Vitals and nursing note reviewed.  Constitutional:      General: She is not in acute distress.    Appearance: Normal appearance.  HENT:     Head: Normocephalic and atraumatic.     Right Ear: Tympanic membrane and ear canal normal.     Left Ear: Tympanic membrane and ear canal normal.     Mouth/Throat:     Mouth: Mucous membranes are moist.     Pharynx: Oropharynx is clear.     Tonsils: No tonsillar exudate or tonsillar abscesses.  Eyes:  Extraocular Movements: Extraocular movements intact.     Conjunctiva/sclera: Conjunctivae normal.     Pupils: Pupils are equal, round, and reactive to light.  Cardiovascular:     Rate and Rhythm: Normal rate and regular rhythm.     Pulses: Normal pulses.     Heart sounds: Normal heart sounds. No murmur heard.    No friction rub. No gallop.  Pulmonary:     Effort: Pulmonary effort is normal.     Breath sounds: Normal breath sounds and air entry.  Abdominal:     General: Abdomen is flat. Bowel sounds are normal.     Palpations: Abdomen is soft.  Musculoskeletal:        General: Normal range of motion.     Cervical back: Normal range of motion and neck supple.     Right lower leg: No edema.     Left lower leg: No  edema.  Lymphadenopathy:     Cervical: No cervical adenopathy.  Skin:    General: Skin is warm and dry.     Capillary Refill: Capillary refill takes less than 2 seconds.  Neurological:     General: No focal deficit present.     Mental Status: She is alert. Mental status is at baseline.  Psychiatric:        Mood and Affect: Mood normal.     (all labs ordered are listed, but only abnormal results are displayed) Labs Reviewed  GROUP A STREP BY PCR  RESP PANEL BY RT-PCR (RSV, FLU A&B, COVID)  RVPGX2  COMPREHENSIVE METABOLIC PANEL WITH GFR  CBC WITH DIFFERENTIAL/PLATELET    EKG: None  Radiology: No results found.   Procedures   Medications Ordered in the ED - No data to display                                  Medical Decision Making Amount and/or Complexity of Data Reviewed Labs: ordered.  Risk Decision regarding hospitalization.   Medical Decision Making:   Diane Skinner is a 89 y.o. female who presented to the ED today with sore throat reports of loose stools detailed above.     Complete initial physical exam performed, notably the patient  was alert and oriented in no apparent distress.  Physical exam is largely unremarkable..    Reviewed and confirmed nursing documentation for past medical history, family history, social history.    Initial Assessment:   With the patient's presentation of sore throat and loose stools, differential includes streptococcal pharyngitis, acute viral respiratory illness, xerostomia.  Differential of loose stools could be acute gastroenteritis, colitis, diverticulitis.   Initial Plan:  Obtain strep swab and respiratory panel Screening labs including CBC and Metabolic panel to evaluate for infectious or metabolic etiology of disease.  Objective evaluation as below reviewed   Initial Study Results:   Laboratory  All laboratory results reviewed without evidence of clinically relevant pathology.   Exceptions include: Creatinine  is elevated 4.06 and BUN is 63.    Consults: Case discussed with hospitalist team.   Reassessment and Plan:   Evaluation of strep swab and respiratory panel showed negative results.  Evaluation of the CMP did show that the creatinine and BUN are elevated as well as with a corresponding decrease in GFR.  As such we will have patient admitted for acute kidney injury, continue fluid resuscitation, and for further workup regarding her presentation.  Consulted with hospitalist team who  accepts patient for admission at this time.       Final diagnoses:  None    ED Discharge Orders     None          Myriam Dorn BROCKS, [place] 03/29/24 2258    Mannie Pac T, DO 03/30/24 [phone]

## 2024-03-29 NOTE — H&P (Signed)
 Diane Skinner FMW:[id number] DOB: 07-13-1923 DOA: 03/29/2024     PCP: Benjamine Aland, MD   Outpatient Specialists: * NONE CARDS: * Dr. None  NEphrology: *  Dr. No care team member to display  NEurology *   Dr. Pulmonary *  Dr.  Oncology * Dr.No care team member to display  GI* Dr.  Gwen, LB) No care team member to display Urology Dr. *  Patient arrived to ER on 03/29/24 at 1738 Referred by Attending Jerrol Agent, MD   Patient coming from:    home Lives alone,   *** With family From facility *** [place], [place], [place]    Chief Complaint: *** Chief Complaint  Patient presents with   Sore Throat    HPI: Diane Skinner is a 89 y.o. female with medical history significant of Alzheimer dementia hypertension,, neuropathy    Presented with diarrhea and sore throat Patient coming from her assisted living facility with sore throat for the past 1 week diarrhea Is been more fatigued lately no chest pain or shortness of breath no cough     Denies significant ETOH intake *** Does not smoke*** but interested in quitting***  Denies marijuana use ***    Regarding pertinent Chronic problems: ***  ****Hyperlipidemia - *on statins {statin:315258}  Lipid Panel  No results found for: CHOL, TRIG, HDL, CHOLHDL, VLDL, LDLCALC, LDLDIRECT, LABVLDL   HTN on Norvasc , hydralazine , valsartan hydrochlorothiazide       *** Asthma -well *** controlled on home inhalers/ nebs                     *** COPD - not **followed by pulmonology *** not  on baseline oxygen  *L,    *** OSA -on nocturnal oxygen, *CPAP, *noncompliant with CPAP  *** Hx of CVA - *with/out residual deficits on Aspirin 81 mg, 325, Plavix      CKD stage IIIb   baseline Cr 2.1 Estimated Creatinine Clearance: 6.6 mL/min (A) (by C-G formula based on SCr of 4.06 mg/dL (H)).  Lab Results  Component Value Date   CREATININE 4.06 (H) 03/29/2024   CREATININE 2.02 (H) 02/02/2024   CREATININE 2.10 (H) 11/22/2023    Lab Results  Component Value Date   NA 136 03/29/2024   CL 108 03/29/2024   K 4.3 03/29/2024   CO2 16 (L) 03/29/2024   BUN 63 (H) 03/29/2024   CREATININE 4.06 (H) 03/29/2024   GFRNONAA 9 (L) 03/29/2024   CALCIUM  8.4 (L) 03/29/2024   ALBUMIN 3.6 03/29/2024   GLUCOSE 97 03/29/2024           Dementia - on Aricept** Nemenda    Chronic anemia - baseline hg Hemoglobin & Hematocrit  Recent Labs    11/22/23 1711 02/02/24 2237 03/29/24 2035  HGB 13.7 11.8* 11.5*   Iron/TIBC/Ferritin/ %Sat No results found for: IRON, TIBC, FERRITIN, IRONPCTSAT   While in ER:    Found to have AKI on CKD cr up to 4 hypotensive    Lab Orders         Group A Strep by PCR if patient complains of sore throat.         Resp panel by RT-PCR (RSV, Flu A&B, Covid) Anterior Nasal Swab         Comprehensive metabolic panel         CBC with Differential      CT HEAD *** NON acute   MRI brain  ***no acute CVA  CXR - ***  NON acute  CTabd/pelvis - ***nonacute  CTA chest - ***nonacute, no PE, * no evidence of infiltrate  Following Medications were ordered in ER: Medications  sodium chloride  0.9 % bolus 500 mL (500 mLs Intravenous New Bag/Given 03/29/24 2227)    _______________________________________________________ ER Provider Called:       DrGOMEZ  They Recommend admit to medicine *** Will see in AM  ***SEEN in ER     ED Triage Vitals  Encounter Vitals Group     BP 03/29/24 1808 (!) 94/52     Girls Systolic BP Percentile --      Girls Diastolic BP Percentile --      Boys Systolic BP Percentile --      Boys Diastolic BP Percentile --      Pulse Rate 03/29/24 1808 63     Resp 03/29/24 1808 18     Temp 03/29/24 1808 (!) 97.1 F (36.2 C)     Temp Source 03/29/24 1808 Oral     SpO2 03/29/24 1808 98 %     Weight 03/29/24 1809 121 lb 4.1 oz (55 kg)     Height 03/29/24 1809 5' 5 (1.651 m)     Head Circumference --      Peak Flow --      Pain Score 03/29/24 1809 3     Pain Loc --       Pain Education --      Exclude from Growth Chart --   UFJK(75)@     _________________________________________ Significant initial  Findings: Abnormal Labs Reviewed  COMPREHENSIVE METABOLIC PANEL WITH GFR - Abnormal; Notable for the following components:      Result Value   CO2 16 (*)    BUN 63 (*)    Creatinine, Ser 4.06 (*)    Calcium  8.4 (*)    GFR, Estimated 9 (*)    All other components within normal limits  CBC WITH DIFFERENTIAL/PLATELET - Abnormal; Notable for the following components:   Hemoglobin 11.5 (*)    HCT 35.9 (*)    Platelets 148 (*)    All other components within normal limits      _________________________ Troponin ***ordered Cardiac Panel (last 3 results) No results for input(s): CKTOTAL, CKMB, TROPONINIHS, RELINDX in the last 72 hours.   ECG: Ordered Personally reviewed and interpreted by me showing: HR : *** Rhythm: *NSR, Sinus tachycardia * A.fib. W RVR, RBBB, LBBB, Paced Ischemic changes*nonspecific changes, no evidence of ischemic changes QTC*  BNP (last 3 results) No results for input(s): BNP in the last 8760 hours.    No results for input(s): DDIMER, FERRITIN, LDH, CRP in the last 72 hours.    ____________________ This patient meets SIRS Criteria and may be septic. SIRS = Systemic Inflammatory Response Syndrome  Order a lactic acid level if needed AND/OR Initiate the sepsis protocol with the attached order set OR Click Treating Associated Infection or Illness if the patient is being treated for an infection that is a known cause of these abnormalities     The recent clinical data is shown below. Vitals:   03/29/24 1809 03/29/24 1900 03/29/24 2000 03/29/24 2115  BP:  99/64 95/60 (!) 97/54  Pulse:  62 (!) 56 65  Resp:  17 18 18   Temp:      TempSrc:      SpO2:  93% 98% 97%  Weight: 55 kg     Height: 5' 5 (1.651 m)           WBC  Component Value Date/Time   WBC 6.5 03/29/2024 2035   LYMPHSABS 2.3  03/29/2024 2035   LYMPHSABS 2.1 03/14/2021 1518   MONOABS 0.6 03/29/2024 2035   EOSABS 0.0 03/29/2024 2035   EOSABS 0.0 03/14/2021 1518   BASOSABS 0.0 03/29/2024 2035   BASOSABS 0.0 03/14/2021 1518        Lactic Acid, Venous    Component Value Date/Time   LATICACIDVEN 1.0 12/05/2022 1215     Lactic Acid, Venous    Component Value Date/Time   LATICACIDVEN 1.0 12/05/2022 1215    Procalcitonin *** Ordered      UA *** no evidence of UTI  ***Pending ***not ordered   Urine analysis:    Component Value Date/Time   COLORURINE YELLOW 02/02/2024 0010   APPEARANCEUR HAZY (A) 02/02/2024 0010   LABSPEC 1.005 02/02/2024 0010   PHURINE 8.0 02/02/2024 0010   GLUCOSEU NEGATIVE 02/02/2024 0010   HGBUR LARGE (A) 02/02/2024 0010   BILIRUBINUR NEGATIVE 02/02/2024 0010   KETONESUR NEGATIVE 02/02/2024 0010   PROTEINUR 30 (A) 02/02/2024 0010   UROBILINOGEN 0.2 03/08/2021 1316   NITRITE NEGATIVE 02/02/2024 0010   LEUKOCYTESUR LARGE (A) 02/02/2024 0010    Results for orders placed or performed during the hospital encounter of 03/29/24  Resp panel by RT-PCR (RSV, Flu A&B, Covid) Anterior Nasal Swab     Status: None   Collection Time: 03/29/24  7:28 PM   Specimen: Anterior Nasal Swab  Result Value Ref Range Status   SARS Coronavirus 2 by RT PCR NEGATIVE NEGATIVE Final    Comment: (NOTE) SARS-CoV-2 target nucleic acids are NOT DETECTED.  The SARS-CoV-2 RNA is generally detectable in upper respiratory specimens during the acute phase of infection. The lowest concentration of SARS-CoV-2 viral copies this assay can detect is 138 copies/mL. A negative result does not preclude SARS-Cov-2 infection and should not be used as the sole basis for treatment or other patient management decisions. A negative result may occur with  improper specimen collection/handling, submission of specimen other than nasopharyngeal swab, presence of viral mutation(s) within the areas targeted by this assay, and  inadequate number of viral copies(<138 copies/mL). A negative result must be combined with clinical observations, patient history, and epidemiological information. The expected result is Negative.  Fact Sheet for Patients:  BloggerCourse.com  Fact Sheet for Healthcare Providers:  SeriousBroker.it  This test is no t yet approved or cleared by the [place]  FDA and  has been authorized for detection and/or diagnosis of SARS-CoV-2 by FDA under an Emergency Use Authorization (EUA). This EUA will remain  in effect (meaning this test can be used) for the duration of the COVID-19 declaration under Section 564(b)(1) of the Act, 21 U.S.C.section 360bbb-3(b)(1), unless the authorization is terminated  or revoked sooner.       Influenza A by PCR NEGATIVE NEGATIVE Final   Influenza B by PCR NEGATIVE NEGATIVE Final    Comment: (NOTE) The Xpert Xpress SARS-CoV-2/FLU/RSV plus assay is intended as an aid in the diagnosis of influenza from Nasopharyngeal swab specimens and should not be used as a sole basis for treatment. Nasal washings and aspirates are unacceptable for Xpert Xpress SARS-CoV-2/FLU/RSV testing.  Fact Sheet for Patients: BloggerCourse.com  Fact Sheet for Healthcare Providers: SeriousBroker.it  This test is not yet approved or cleared by the [place]  FDA and has been authorized for detection and/or diagnosis of SARS-CoV-2 by FDA under an Emergency Use Authorization (EUA). This EUA will remain in effect (meaning this test can be used) for  the duration of the COVID-19 declaration under Section 564(b)(1) of the Act, 21 U.S.C. section 360bbb-3(b)(1), unless the authorization is terminated or revoked.     Resp Syncytial Virus by PCR NEGATIVE NEGATIVE Final    Comment: (NOTE) Fact Sheet for Patients: BloggerCourse.com  Fact Sheet for Healthcare  Providers: SeriousBroker.it  This test is not yet approved or cleared by the [place]  FDA and has been authorized for detection and/or diagnosis of SARS-CoV-2 by FDA under an Emergency Use Authorization (EUA). This EUA will remain in effect (meaning this test can be used) for the duration of the COVID-19 declaration under Section 564(b)(1) of the Act, 21 U.S.C. section 360bbb-3(b)(1), unless the authorization is terminated or revoked.  Performed at [facility], [address] [place], [place] [address]   Group A Strep by PCR if patient complains of sore throat.     Status: None   Collection Time: 03/29/24  8:35 PM   Specimen: Throat; Sterile Swab  Result Value Ref Range Status   Group A Strep by PCR NOT DETECTED NOT DETECTED Final    Comment: Performed at [facility], [address], [place], [place] [address]    ABX started Antibiotics Given (last 72 hours)     None        Susceptibility data from last 90 days. Collected Specimen Info Organism AMPICILLIN AMPICILLIN/SULBACTAM CEFAZOLIN CEFEPIME CEFTRIAXONE  Ciprofloxacin Gentamicin Susc lslt Imipenem Nitrofurantoin Susc lslt Piperacillin + Tazobactam Trimethoprim/Sulfa  02/02/24 Urine, Random Escherichia coli  S  S  S  S  S  S  S  S  S  S  S     ________________________________________________________________  Arterial ***Venous  Blood Gas result:  pH *** pCO2 ***; pO2 ***;     %O2 Sat ***.  ABG No results found for: PHART, PCO2ART, PO2ART, HCO3, TCO2, ACIDBASEDEF, O2SAT     __________________________________________________________ Recent Labs  Lab 03/29/24 2035  NA 136  K 4.3  CO2 16*  GLUCOSE 97  BUN 63*  CREATININE 4.06*  CALCIUM  8.4*    Cr  * stable,  Up from baseline see below Lab Results  Component Value Date   CREATININE 4.06 (H) 03/29/2024   CREATININE 2.02 (H) 02/02/2024   CREATININE 2.10 (H) 11/22/2023     Recent Labs  Lab 03/29/24 2035  AST 16  ALT 8  ALKPHOS 62  BILITOT 1.1  PROT 6.9  ALBUMIN 3.6   Lab Results  Component Value Date   CALCIUM  8.4 (L) 03/29/2024          Plt: Lab Results  Component Value Date   PLT 148 (L) 03/29/2024         Recent Labs  Lab 03/29/24 2035  WBC 6.5  NEUTROABS 3.6  HGB 11.5*  HCT 35.9*  MCV 82.0  PLT 148*    HG/HCT * stable,  Down *Up from baseline see below    Component Value Date/Time   HGB 11.5 (L) 03/29/2024 2035   HGB 12.7 03/14/2021 1518   HCT 35.9 (L) 03/29/2024 2035   HCT 39.8 03/14/2021 1518   MCV 82.0 03/29/2024 2035   MCV 82 03/14/2021 1518      No results for input(s): LIPASE, AMYLASE in the last 168 hours. No results for input(s): AMMONIA in the last 168 hours.    _______________________________________________ Hospitalist was called for admission for *** Pharyngitis, unspecified etiology ***  AKI (acute kidney injury) (HCC) ***    The following Work up has been ordered so  far:  Orders Placed This Encounter  Procedures   Group A Strep by PCR if patient complains of sore throat.   Resp panel by RT-PCR (RSV, Flu A&B, Covid) Anterior Nasal Swab   Comprehensive metabolic panel   CBC with Differential   Consult to hospitalist   Saline lock IV     OTHER Significant initial  Findings:  labs showing:     DM  labs:  HbA1C: No results for input(s): HGBA1C in the last 8760 hours.     CBG (last 3)  No results for input(s): GLUCAP in the last 72 hours.        Cultures:    Component Value Date/Time   SDES  02/02/2024 0010    URINE, RANDOM Performed at [facility], [address] [place], [place] [address]    SPECREQUEST  02/02/2024 0010    NONE Reflexed from [id number] Performed at [facility], [address], [place], [place] [address]    CULT >=100,000 COLONIES/mL ESCHERICHIA COLI (A) 02/02/2024 0010   REPTSTATUS 02/05/2024 FINAL  02/02/2024 0010     Radiological Exams on Admission: No results found. _______________________________________________________________________________________________________ Latest  Blood pressure (!) 97/54, pulse 65, temperature (!) 97.1 F (36.2 C), temperature source Oral, resp. rate 18, height 5' 5 (1.651 m), weight 55 kg, SpO2 97%.   Vitals  labs and radiology finding personally reviewed  Review of Systems:    Pertinent positives include: ***  Constitutional:  No weight loss, night sweats, Fevers, chills, fatigue, weight loss  HEENT:  No headaches, Difficulty swallowing,Tooth/dental problems,Sore throat,  No sneezing, itching, ear ache, nasal congestion, post nasal drip,  Cardio-vascular:  No chest pain, Orthopnea, PND, anasarca, dizziness, palpitations.no Bilateral lower extremity swelling  GI:  No heartburn, indigestion, abdominal pain, nausea, vomiting, diarrhea, change in bowel habits, loss of appetite, melena, blood in stool, hematemesis Resp:  no shortness of breath at rest. No dyspnea on exertion, No excess mucus, no productive cough, No non-productive cough, No coughing up of blood.No change in color of mucus.No wheezing. Skin:  no rash or lesions. No jaundice GU:  no dysuria, change in color of urine, no urgency or frequency. No straining to urinate.  No flank pain.  Musculoskeletal:  No joint pain or no joint swelling. No decreased range of motion. No back pain.  Psych:  No change in mood or affect. No depression or anxiety. No memory loss.  Neuro: no localizing neurological complaints, no tingling, no weakness, no double vision, no gait abnormality, no slurred speech, no confusion  All systems reviewed and apart from HOPI all are negative _______________________________________________________________________________________________ Past Medical History:   Past Medical History:  Diagnosis Date   Arthritis    Hypertension    Renal disorder       Past  Surgical History:  Procedure Laterality Date   BREAST SURGERY     CHOLECYSTECTOMY      Social History:  Ambulatory *** independently cane, walker  wheelchair bound, bed bound     reports that she has never smoked. She has never used smokeless tobacco. She reports that she does not currently use alcohol. She reports that she does not currently use drugs.     Family History: *** Family History  Problem Relation Age of Onset   Hypertension Mother    Heart disease Mother    Breast cancer Sister    ______________________________________________________________________________________________ Allergies: Allergies  Allergen Reactions   Ibuprofen Swelling   Celexa  [Citalopram ] Other (See Comments)  Tremors of hands/legs   Valsartan-Hydrochlorothiazide Other (See Comments)    Hyperkalemia     Prior to Admission medications   Medication Sig Start Date End Date Taking? Authorizing Provider  amLODipine  (NORVASC ) 10 MG tablet TAKE 1 TABLET BY MOUTH EVERY DAY 02/25/22   Zinoviev, Eva, MD  cephALEXin  (KEFLEX ) 500 MG capsule Take 1 capsule (500 mg total) by mouth 2 (two) times daily. 02/03/24   Jarold Olam HERO, PA-C  citalopram  (CELEXA ) 20 MG tablet Take 20 mg by mouth daily. 09/30/23   [provider]  Cyanocobalamin  (VITAMIN B12 PO) Take by mouth at bedtime.    [provider]  dicyclomine  (BENTYL ) 20 MG tablet Take 1 tablet (20 mg total) by mouth 2 (two) times daily as needed for up to 10 doses for spasms. Cramping abdominal pain and diarrhea 11/22/23   Cottie Donnice PARAS, MD  gabapentin  (NEURONTIN ) 300 MG capsule Take 1 capsule (300 mg total) by mouth at bedtime. Patient taking differently: Take 600 mg by mouth at bedtime. 12/10/22   Jillian Buttery, MD  hydrALAZINE  (APRESOLINE ) 25 MG tablet Take 1 tablet (25 mg total) by mouth every 8 (eight) hours. 09/13/23 12/12/23  Laurence Locus, DO  Multiple Vitamins-Minerals (THERA-M PO) Take by mouth daily.    [provider]   ondansetron  (ZOFRAN ) 4 MG tablet Take 1 tablet (4 mg total) by mouth every 8 (eight) hours as needed for up to 15 doses for nausea or vomiting. 11/22/23   Cottie Donnice PARAS, MD  traZODone  (DESYREL ) 50 MG tablet Take 1 tablet (50 mg total) by mouth at bedtime. 12/10/22   Adhikari, Amrit, MD  valsartan-hydrochlorothiazide (DIOVAN-HCT) 160-25 MG tablet Take 1 tablet by mouth daily. 10/26/23   [provider]    ___________________________________________________________________________________________________ Physical Exam:    03/29/2024    9:15 PM 03/29/2024    8:00 PM 03/29/2024    7:00 PM  Vitals with BMI  Systolic 97 95 99  Diastolic 54 60 64  Pulse 65 56 62     1. General:  in No ***Acute distress***increased work of breathing ***complaining of severe pain****agitated * Chronically ill *well *cachectic *toxic acutely ill -appearing 2. Psychological: Alert and *** Oriented 3. Head/ENT:   Moist *** Dry Mucous Membranes                          Head Non traumatic, neck supple                          Normal *** Poor Dentition 4. SKIN: normal *** decreased Skin turgor,  Skin clean Dry and intact no rash    5. Heart: Regular rate and rhythm no*** Murmur, no Rub or gallop 6. Lungs: ***Clear to auscultation bilaterally, no wheezes or crackles   7. Abdomen: Soft, ***non-tender, Non distended *** obese ***bowel sounds present 8. Lower extremities: no clubbing, cyanosis, no ***edema 9. Neurologically Grossly intact, moving all 4 extremities equally *** strength 5 out of 5 in all 4 extremities cranial nerves II through XII intact 10. MSK: Normal range of motion    Chart has been reviewed  ______________________________________________________________________________________________  Assessment/Plan  ***  Admitted for *** Pharyngitis, unspecified etiology ***  AKI (acute kidney injury) (HCC) ***    Present on Admission: **None**     No problem-specific Assessment & Plan  notes found for this encounter.    Other plan as per orders.  DVT prophylaxis:  SCD ***  Lovenox        Code Status:    Code Status: Prior FULL CODE *** DNR/DNI ***comfort care as per patient ***family  I had personally discussed CODE STATUS with patient and family*  ACP *** none has been reviewed ***   Family Communication:   Family not at  Bedside  plan of care was discussed on the phone with *** Son, Daughter, Wife, Husband, Sister, Brother , father, mother  Diet    Disposition Plan:   *** likely will need placement for rehabilitation                          Back to current facility when stable                            To home once workup is complete and patient is stable  ***Following barriers for discharge:                             Chest pain *** Stroke *** Syncope ***work up is complete                            Electrolytes corrected                               Anemia corrected h/H stable                             Pain controlled with PO medications                               Afebrile, white count improving able to transition to PO antibiotics                             Will need to be able to tolerate PO                            Will likely need home health, home O2, set up                           Will need consultants to evaluate patient prior to discharge                           Work of breathing improves       Consult Orders  (From admission, onward)           Start     Ordered   03/29/24 2244  Consult to hospitalist  Once       Provider:  (Not yet assigned)  Question Answer Comment  Place call to: [facility]   Reason for Consult Admit      03/29/24 2243                              ***Would benefit from PT/OT eval prior to DC  Ordered  Swallow eval - SLP ordered                   Diabetes care coordinator                   Transition of care consulted                   Nutrition    consulted                   Wound care  consulted                   Palliative care    consulted                   Behavioral health  consulted                    Consults called: ***  NONE   Admission status:  ED Disposition     ED Disposition  Admit   Condition  --   Comment  The patient appears reasonably stabilized for admission considering the current resources, flow, and capabilities available in the ED at this time, and I doubt any other [facility] requiring further screening and/or treatment in the ED prior to admission is  present.           Obs***  ***  inpatient     I Expect 2 midnight stay secondary to severity of patient's current illness need for inpatient interventions justified by the following: ***hemodynamic instability despite optimal treatment (tachycardia *hypotension * tachypnea *hypoxia, hypercapnia) *** Severe lab/radiological/exam abnormalities including:    Pharyngitis, unspecified etiology ***  AKI (acute kidney injury) (HCC) ***  and extensive comorbidities including: *substance abuse  *Chronic pain *DM2  * CHF * CAD  * COPD/asthma *Morbid Obesity * CKD *dementia *liver disease *history of stroke with residual deficits *  malignancy, * sickle cell disease  History of amputation Chronic anticoagulation  That are currently affecting medical management.   I expect  patient to be hospitalized for 2 midnights requiring inpatient medical care.  Patient is at high risk for adverse outcome (such as loss of life or disability) if not treated.  Indication for inpatient stay as follows:  Severe change from baseline regarding mental status Hemodynamic instability despite maximal medical therapy,  severe pain requiring acute inpatient management,  inability to maintain oral hydration   persistent chest pain despite medical management Need for operative/procedural  intervention New or worsening hypoxia ongoing suicidal ideations   Need for IV antibiotics, IV  fluids,, IV pain medications, IV anticoagulation,  IV rate controling medications, IV antihypertensives need for biPAP Frequent labs    Level of care   *** tele  For 12H 24H     medical floor       progressive     stepdown   tele indefinitely please discontinue once patient no longer qualifies COVID-19 Labs    Critical***  Patient is critically ill due to  hemodynamic instability * respiratory failure *severe sepsis* ongoing chest pain*  They are at high risk for life/limb threatening clinical deterioration requiring frequent reassessment and modifications of care.  Services provided include examination of the patient, review of relevant ancillary tests, prescription of lifesaving therapies, review of medications and prophylactic therapy.  Total critical care time excluding separately billable procedures: 60*  Minutes.    Maxtyn Nuzum 03/29/2024, 10:50 PM ***  [facility]  after 2 AM please page floor coverage   If 7AM-7PM, please contact the day team taking care of the patient using [email or web address]

## 2024-03-30 ENCOUNTER — Observation Stay (HOSPITAL_COMMUNITY)

## 2024-03-30 DIAGNOSIS — Z886 Allergy status to analgesic agent status: Secondary | ICD-10-CM | POA: Diagnosis not present

## 2024-03-30 DIAGNOSIS — Z7409 Other reduced mobility: Secondary | ICD-10-CM | POA: Diagnosis present

## 2024-03-30 DIAGNOSIS — Z79899 Other long term (current) drug therapy: Secondary | ICD-10-CM | POA: Diagnosis not present

## 2024-03-30 DIAGNOSIS — J029 Acute pharyngitis, unspecified: Secondary | ICD-10-CM | POA: Diagnosis present

## 2024-03-30 DIAGNOSIS — G629 Polyneuropathy, unspecified: Secondary | ICD-10-CM | POA: Diagnosis present

## 2024-03-30 DIAGNOSIS — Z8249 Family history of ischemic heart disease and other diseases of the circulatory system: Secondary | ICD-10-CM | POA: Diagnosis not present

## 2024-03-30 DIAGNOSIS — D631 Anemia in chronic kidney disease: Secondary | ICD-10-CM | POA: Diagnosis present

## 2024-03-30 DIAGNOSIS — G309 Alzheimer's disease, unspecified: Secondary | ICD-10-CM | POA: Diagnosis present

## 2024-03-30 DIAGNOSIS — E440 Moderate protein-calorie malnutrition: Secondary | ICD-10-CM | POA: Diagnosis present

## 2024-03-30 DIAGNOSIS — N179 Acute kidney failure, unspecified: Secondary | ICD-10-CM | POA: Diagnosis present

## 2024-03-30 DIAGNOSIS — I129 Hypertensive chronic kidney disease with stage 1 through stage 4 chronic kidney disease, or unspecified chronic kidney disease: Secondary | ICD-10-CM | POA: Diagnosis present

## 2024-03-30 DIAGNOSIS — Z9049 Acquired absence of other specified parts of digestive tract: Secondary | ICD-10-CM | POA: Diagnosis not present

## 2024-03-30 DIAGNOSIS — R197 Diarrhea, unspecified: Secondary | ICD-10-CM | POA: Diagnosis present

## 2024-03-30 DIAGNOSIS — E860 Dehydration: Secondary | ICD-10-CM | POA: Diagnosis present

## 2024-03-30 DIAGNOSIS — G9341 Metabolic encephalopathy: Secondary | ICD-10-CM | POA: Diagnosis present

## 2024-03-30 DIAGNOSIS — A084 Viral intestinal infection, unspecified: Secondary | ICD-10-CM | POA: Diagnosis present

## 2024-03-30 DIAGNOSIS — F05 Delirium due to known physiological condition: Secondary | ICD-10-CM | POA: Diagnosis not present

## 2024-03-30 DIAGNOSIS — Z888 Allergy status to other drugs, medicaments and biological substances status: Secondary | ICD-10-CM | POA: Diagnosis not present

## 2024-03-30 DIAGNOSIS — Z681 Body mass index (BMI) 19 or less, adult: Secondary | ICD-10-CM | POA: Diagnosis not present

## 2024-03-30 DIAGNOSIS — N184 Chronic kidney disease, stage 4 (severe): Secondary | ICD-10-CM | POA: Diagnosis present

## 2024-03-30 DIAGNOSIS — E8721 Acute metabolic acidosis: Secondary | ICD-10-CM | POA: Diagnosis present

## 2024-03-30 DIAGNOSIS — L89326 Pressure-induced deep tissue damage of left buttock: Secondary | ICD-10-CM | POA: Diagnosis present

## 2024-03-30 DIAGNOSIS — Z1152 Encounter for screening for COVID-19: Secondary | ICD-10-CM | POA: Diagnosis not present

## 2024-03-30 DIAGNOSIS — F0280 Dementia in other diseases classified elsewhere without behavioral disturbance: Secondary | ICD-10-CM | POA: Diagnosis present

## 2024-03-30 LAB — RETICULOCYTES
Immature Retic Fract: 17.7 % — ABNORMAL HIGH (ref 2.3–15.9)
RBC.: 4.28 MIL/uL (ref 3.87–5.11)
Retic Count, Absolute: 51.8 K/uL (ref 19.0–186.0)
Retic Ct Pct: 1.2 % (ref 0.4–3.1)

## 2024-03-30 LAB — GASTROINTESTINAL PANEL BY PCR, STOOL (REPLACES STOOL CULTURE)

## 2024-03-30 LAB — COMPREHENSIVE METABOLIC PANEL WITH GFR
ALT: 7 U/L (ref 0–44)
AST: 13 U/L — ABNORMAL LOW (ref 15–41)
Albumin: 3.2 g/dL — ABNORMAL LOW (ref 3.5–5.0)
Alkaline Phosphatase: 60 U/L (ref 38–126)
Anion gap: 12 (ref 5–15)
BUN: 59 mg/dL — ABNORMAL HIGH (ref 8–23)
CO2: 12 mmol/L — ABNORMAL LOW (ref 22–32)
Calcium: 8 mg/dL — ABNORMAL LOW (ref 8.9–10.3)
Chloride: 110 mmol/L (ref 98–111)
Creatinine, Ser: 2.94 mg/dL — ABNORMAL HIGH (ref 0.44–1.00)
GFR, Estimated: 14 mL/min — ABNORMAL LOW (ref >60)
Glucose, Bld: 84 mg/dL (ref 70–99)
Potassium: 3.8 mmol/L (ref 3.5–5.1)
Sodium: 134 mmol/L — ABNORMAL LOW (ref 135–145)
Total Bilirubin: 0.8 mg/dL (ref 0.0–1.2)
Total Protein: 6.1 g/dL — ABNORMAL LOW (ref 6.5–8.1)

## 2024-03-30 LAB — OSMOLALITY: Osmolality: 309 mosm/kg — ABNORMAL HIGH (ref 275–295)

## 2024-03-30 LAB — PHOSPHORUS
Phosphorus: 5.1 mg/dL — ABNORMAL HIGH (ref 2.5–4.6)
Phosphorus: 5.1 mg/dL — ABNORMAL HIGH (ref 2.5–4.6)

## 2024-03-30 LAB — URINALYSIS, COMPLETE (UACMP) WITH MICROSCOPIC
Bilirubin Urine: NEGATIVE
Glucose, UA: NEGATIVE mg/dL
Hgb urine dipstick: NEGATIVE
Ketones, ur: NEGATIVE mg/dL
Nitrite: NEGATIVE
Protein, ur: NEGATIVE mg/dL
Specific Gravity, Urine: 1.009 (ref 1.005–1.030)
pH: 5 (ref 5.0–8.0)

## 2024-03-30 LAB — CREATININE, URINE, RANDOM: Creatinine, Urine: 155 mg/dL

## 2024-03-30 LAB — TSH: TSH: 2.807 u[IU]/mL (ref 0.350–4.500)

## 2024-03-30 LAB — FERRITIN: Ferritin: 37 ng/mL (ref 11–307)

## 2024-03-30 LAB — FOLATE: Folate: 24.5 ng/mL (ref >5.9)

## 2024-03-30 LAB — CBC
HCT: 35 % — ABNORMAL LOW (ref 36.0–46.0)
Hemoglobin: 11.1 g/dL — ABNORMAL LOW (ref 12.0–15.0)
MCH: 26.2 pg (ref 26.0–34.0)
MCHC: 31.7 g/dL (ref 30.0–36.0)
MCV: 82.5 fL (ref 80.0–100.0)
Platelets: 144 K/uL — ABNORMAL LOW (ref 150–400)
RBC: 4.24 MIL/uL (ref 3.87–5.11)
RDW: 14.4 % (ref 11.5–15.5)
WBC: 7.2 K/uL (ref 4.0–10.5)
nRBC: 0 % (ref 0.0–0.2)

## 2024-03-30 LAB — IRON AND TIBC
Iron: 77 ug/dL (ref 28–170)
Saturation Ratios: 35 % — ABNORMAL HIGH (ref 10.4–31.8)
TIBC: 223 ug/dL — ABNORMAL LOW (ref 250–450)
UIBC: 146 ug/dL

## 2024-03-30 LAB — OSMOLALITY, URINE: Osmolality, Ur: 256 mosm/kg — ABNORMAL LOW (ref 300–900)

## 2024-03-30 LAB — LACTIC ACID, PLASMA: Lactic Acid, Venous: 0.8 mmol/L (ref 0.5–1.9)

## 2024-03-30 LAB — SODIUM, URINE, RANDOM: Sodium, Ur: 26 mmol/L

## 2024-03-30 LAB — MAGNESIUM
Magnesium: 1.9 mg/dL (ref 1.7–2.4)
Magnesium: 2 mg/dL (ref 1.7–2.4)

## 2024-03-30 LAB — CK: Total CK: 49 U/L (ref 38–234)

## 2024-03-30 LAB — VITAMIN B12: Vitamin B-12: 460 pg/mL (ref 180–914)

## 2024-03-30 MED ORDER — MENTHOL 3 MG MT LOZG
1.0000 | LOZENGE | OROMUCOSAL | Status: DC | PRN
  Filled 2024-03-30: qty 9

## 2024-03-30 MED ORDER — SODIUM CHLORIDE 0.45 % IV SOLN
INTRAVENOUS | Status: AC
  Filled 2024-03-30 (×4): qty 75

## 2024-03-30 MED ORDER — ALBUTEROL SULFATE (2.5 MG/3ML) 0.083% IN NEBU: 2.5000 mg | INHALATION_SOLUTION | Freq: Four times a day (QID) | RESPIRATORY_TRACT | Status: DC | PRN

## 2024-03-30 NOTE — Assessment & Plan Note (Signed)
Chronic monitor for any sign of sundowning 

## 2024-03-30 NOTE — Assessment & Plan Note (Signed)
 Obtain anemia panel transfuse as needed for hemoglobin below 7

## 2024-03-30 NOTE — Progress Notes (Signed)
 Initial Nutrition Assessment  DOCUMENTATION CODES:   Not applicable  INTERVENTION:  - Clear Liquid diet per MD.  - Boost Breeze po BID, each supplement provides 250 kcal and 9 grams of protein - Encourage intake at meals and of supplements as tolerated.  - Monitor weight trends.  NUTRITION DIAGNOSIS:   Increased nutrient needs related to acute illness as evidenced by estimated needs.  GOAL:   Patient will meet greater than or equal to 90% of their needs  MONITOR:   PO intake, Supplement acceptance, Diet advancement, Labs, Weight trends  REASON FOR ASSESSMENT:   Consult Assessment of nutrition requirement/status  ASSESSMENT:   89 y.o. female with PMH significant for Alzheimer dementia, HTN, neuropathy, who presented with diarrhea and sore throat. Admitted for AKI on CKD.  RD working remotely. Per chart review, patient with history of dementia and confused.   Per EMR, weight appears stable since March 2024.  Patient is documented to have consumed 100% of lunch today, although noted to only be on clear liquids. Will order Boost Breeze to support oral intake.    Medications reviewed and include: -  Labs reviewed:  Na 134 Creatinine 2.94 Phosphorus 5.1   NUTRITION - FOCUSED PHYSICAL EXAM:  RD working remotely  Diet Order:   Diet Order             Diet clear liquid Fluid consistency: Thin  Diet effective now                   EDUCATION NEEDS:  Not appropriate for education at this time  Skin:  Skin Assessment: Reviewed RN Assessment  Last BM:  7/9 - type 7  Height:  Ht Readings from Last 1 Encounters:  03/29/24 5' 5 (1.651 m)   Weight:  Wt Readings from Last 1 Encounters:  03/30/24 54.2 kg    BMI:  Body mass index is 19.88 kg/m.  Estimated Nutritional Needs:  Kcal:  1350-1450 kcals Protein:  55-65 grams Fluid:  >/= 1.4L    Trude Ned RD, LDN Contact via Secure Chat.

## 2024-03-30 NOTE — ED Notes (Signed)
 Updated Darryle Friedman about pt's status and plan of care.

## 2024-03-30 NOTE — Assessment & Plan Note (Signed)
 Will order gastric panel Given abdominal tenderness obtain CT abdomen pelvis for further evaluation

## 2024-03-30 NOTE — Evaluation (Signed)
 Occupational Therapy Evaluation Patient Details Name: Diane Skinner MRN: [id number] DOB: 03/03/1923 Today's Date: 03/30/2024   History of Present Illness   Diane Skinner is a 89 y.o. female who presents with concerns of sore throat as well as multiple episodes of loose stools.  As noted she lives in an assisted living facility, brought to the ED by EMS.  States that she has been feeling unwell over the last week. Admitted for sore throat, diarrhea, AKI/acute metabolic acidosis PMH: primary hypertension, Alzheimer's dementia, peripheral neuropathy.     Clinical Impressions PTA, patient resides in [facility] ALF (unsure if memory care or AL side) however no family bedside and patient limited historian to provide detailed plof other than amb with rollator and could perform some ADL's on her own.  Currently, patient presents with deficits outlined below (see OT Problem List for details) most significantly mild pain, generalized muscle weakness, decreased balance, activity tolerance and cognition for BADL's (mod-max A LB) and functional mobility (min A basic transfer and stepping bedside with RW). Patient requires continued Acute care hospital level OT services to progress safety and functional performance and allow for discharge. Patient will benefit from continued inpatient follow up therapy, <3 hours/day.        If plan is discharge home, recommend the following:   A lot of help with walking and/or transfers;A lot of help with bathing/dressing/bathroom;Assistance with cooking/housework;Direct supervision/assist for medications management;Direct supervision/assist for financial management;Assist for transportation;Help with stairs or ramp for entrance;Supervision due to cognitive status     Functional Status Assessment   Patient has had a recent decline in their functional status and demonstrates the ability to make significant improvements in function in a reasonable and predictable  amount of time.     Equipment Recommendations   None recommended by OT      Precautions/Restrictions   Precautions Precautions: Fall Restrictions Weight Bearing Restrictions Per Provider Order: No     Mobility Bed Mobility Overal bed mobility: Needs Assistance Bed Mobility: Rolling, Supine to Sit Rolling: Contact guard assist, Used rails   Supine to sit: Min assist, HOB elevated, Used rails     General bed mobility comments: mod cues and increased time and effort to scoot to EOB    Transfers Overall transfer level: Needs assistance Equipment used: Rolling walker (2 wheels) Transfers: Sit to/from Stand, Bed to chair/wheelchair/BSC Sit to Stand: Min assist     Step pivot transfers: Min assist     General transfer comment: forward flexed posture, cues for powering up with increased time and assist to steady      Balance Overall balance assessment: Needs assistance Sitting-balance support: Feet supported Sitting balance-Leahy Scale: Fair     Standing balance support: During functional activity, Reliant on assistive device for balance, Bilateral upper extremity supported Standing balance-Leahy Scale: Poor Standing balance comment: cues and facilitation for upright posture and hip/knee extension                           ADL either performed or assessed with clinical judgement   ADL Overall ADL's : Needs assistance/impaired Eating/Feeding: Set up;Sitting   Grooming: Wash/dry hands;Wash/dry face;Oral care;Set up;Sitting   Upper Body Bathing: Minimal assistance;Sitting   Lower Body Bathing: Maximal assistance;Sit to/from stand   Upper Body Dressing : Minimal assistance;Sitting   Lower Body Dressing: Maximal assistance;Sit to/from stand Lower Body Dressing Details (indicate cue type and reason): unable to reach to LB and feet to change  undergarment and socks Toilet Transfer: Minimal assistance;Stand-pivot;BSC/3in1;Rolling walker (2 wheels)    Toileting- Clothing Manipulation and Hygiene: Moderate assistance;Sit to/from stand Toileting - Clothing Manipulation Details (indicate cue type and reason): usign Pure[place] but changed pad and undergarments due to mild wetness     Functional mobility during ADLs: Minimal assistance;Rolling walker (2 wheels);Cueing for sequencing;Cueing for safety General ADL Comments: decreased LB self care     Vision Baseline Vision/History: 0 No visual deficits Ability to See in Adequate Light: 0 Adequate Patient Visual Report: No change from baseline Vision Assessment?: No apparent visual deficits Additional Comments: able to read clock and white board without any difficulty            Pertinent Vitals/Pain Pain Assessment Pain Assessment: Faces Faces Pain Scale: Hurts a little bit Pain Location: throat Pain Descriptors / Indicators: Sore Pain Intervention(s): Monitored during session     Extremity/Trunk Assessment Upper Extremity Assessment Upper Extremity Assessment: Generalized weakness;Right hand dominant   Lower Extremity Assessment Lower Extremity Assessment: Generalized weakness   Cervical / Trunk Assessment Cervical / Trunk Assessment: Kyphotic   Communication Communication Communication: Impaired Factors Affecting Communication: Hearing impaired (has hearing aides, offered and declined to use this session)   Cognition Arousal: Alert Behavior During Therapy: WFL for tasks assessed/performed Cognition: History of cognitive impairments             OT - Cognition Comments: requires repetition due to [facility], decreased STM and recall of recent history and plof, no impulsivity, Ox 3 (name, place, month                 Following commands: Impaired Following commands impaired: Follows one step commands with increased time     Cueing  General Comments   Cueing Techniques: Verbal cues;Gestural cues;Tactile cues;Visual cues  limityied activity tolerance, on RA with  SpO2 98%           Home Living Family/patient expects to be discharged to:: Assisted living                             Home Equipment: Rolling Walker (2 wheels);Rollator (4 wheels);Shower seat   Additional Comments: Patient reports she lives at [facility] and was amb with rollator and able to walk around and sit out on the porch. Limited recall and no family bedside to provide history      Prior Functioning/Environment Prior Level of Function : Patient poor historian/Family not available;Needs assist  Cognitive Assist : Mobility (cognitive);ADLs (cognitive) Mobility (Cognitive): Set up cues ADLs (Cognitive): Set up cues Physical Assist : Mobility (physical);ADLs (physical) Mobility (physical): Gait ADLs (physical): Bathing;Dressing;Toileting;IADLs Mobility Comments: pt reports ambulating with use of a rollator ADLs Comments: reports use of rollator and can do most little things by myself    OT Problem List: Decreased strength;Decreased activity tolerance;Impaired balance (sitting and/or standing);Decreased cognition;Decreased safety awareness;Decreased knowledge of use of DME or AE;Decreased knowledge of precautions;Cardiopulmonary status limiting activity;Pain   OT Treatment/Interventions: Self-care/ADL training;Therapeutic exercise;Neuromuscular education;DME and/or AE instruction;Therapeutic activities;Cognitive remediation/compensation;Patient/family education;Balance training      OT Goals(Current goals can be found in the care plan section)   Acute Rehab OT Goals Patient Stated Goal: to feel better OT Goal Formulation: With patient Time For Goal Achievement: 04/13/24 Potential to Achieve Goals: Good ADL Goals Pt Will Perform Lower Body Bathing: with supervision;sit to/from stand Pt Will Perform Lower Body Dressing: with supervision;sit to/from stand Pt Will Transfer to Toilet: with contact guard assist;ambulating;regular  height toilet;grab bars Pt  Will Perform Toileting - Clothing Manipulation and hygiene: with supervision;sit to/from stand   OT Frequency:  Min 2X/week    Co-evaluation              AM-PAC OT 6 Clicks Daily Activity     Outcome Measure Help from another person eating meals?: A Little Help from another person taking care of personal grooming?: A Little Help from another person toileting, which includes using toliet, bedpan, or urinal?: A Lot Help from another person bathing (including washing, rinsing, drying)?: A Lot Help from another person to put on and taking off regular upper body clothing?: A Little Help from another person to put on and taking off regular lower body clothing?: Total 6 Click Score: 14   End of Session Equipment Utilized During Treatment: Gait belt;Rolling walker (2 wheels) Nurse Communication: Mobility status  Activity Tolerance: Patient limited by fatigue Patient left: in chair;with chair alarm set;with call bell/phone within reach;with nursing/sitter in room  OT Visit Diagnosis: Unsteadiness on feet (R26.81);Other abnormalities of gait and mobility (R26.89);Muscle weakness (generalized) (M62.81);Cognitive communication deficit (R41.841);Pain Pain - part of body:  (throat)                Time: 8544-8474 OT Time Calculation (min): 30 min Charges:  OT General Charges $OT Visit: 1 Visit OT Evaluation $OT Eval Low Complexity: 1 Low OT Treatments $Self Care/Home Management : 8-22 mins Marjon Doxtater OT/L Acute Rehabilitation Department  [phone]  03/30/2024, 3:33 PM

## 2024-03-30 NOTE — Progress Notes (Signed)
 PROGRESS NOTE  Diane Skinner  DOB: Feb 01, 1923  PCP: Benjamine Aland, MD FMW:[id number]  DOA: 03/29/2024  LOS: 0 days  Hospital Day: 2  Brief narrative: Diane Skinner is a 89 y.o. female with PMH significant for dementia, hypertension, arthritis, peripheral neuropathy Patient lives in an assisted living facility  7/8, patient was brought to the ED by EMS with complaint of sore throat for a week, and multiple episode of loose stools and progressive fatigue.  In the ED, patient was afebrile, her temperature has been actually on the lower side to 97 and 98. Blood pressure 90s and low 100s Initial labs with WC count 6.5, hemoglobin 11.5, lactic acid 0.8, BUN/creatinine 63/4.06 Respiratory virus panel unremarkable Group A streptococcal PCR negative Urinalysis showed hazy yellow urine with small leukocytes, few bacteria Urine culture sent CT abdomen pelvis showed infiltration or atelectasis in the lung bases. Possible wall thickening the polar region of the stomach suggesting gastritis or possibly peptic ulcer disease.  Patient was started on IV fluid Admitted to [facility]  Subjective: Patient was seen and examined this afternoon. Pleasant elderly African-American female.  Alert, awake, used very minimal words.  Likely due to underlying dementia.  Not in distress.  Family not at bedside. Chart reviewed Blood pressure improving. Labs from this morning with BUN/creatinine 59/2.94  Assessment and plan: AKI on CKD 4 Acute metabolic acidosis Baseline creatinine 2 from May 2025.  Presented with creatinine elevated to 4.06 secondary to dehydration from GI loss.  Creatinine is improving with IV fluid. Interestingly her bicarb level is also significantly low and worsening, at 12 today. Change IV fluid to sodium bicarb Recent Labs    09/11/23 1000 09/12/23 0553 09/13/23 0553 11/22/23 1835 02/02/24 2237 03/29/24 2035 03/30/24 0626  BUN 48* 47* 38* 43* 29* 63* 59*  CREATININE 2.69* 2.32*  2.09* 2.10* 2.02* 4.06* 2.94*  CO2 19* 19* 22 21* 24 16* 12*   Hyperphosphatemia Phosphorus elevated 5.1 likely in the setting of CKD.  Continue to monitor Recent Labs  Lab 03/29/24 2035 03/30/24 0626 03/30/24 1242  K 4.3 3.8  --   MG  --  1.9 2.0  PHOS  --  5.1* 5.1*   Diarrhea Reported diarrhea for a week CT scan without any specific etiology, suggested gastritis which is less likely be the cause of her symptoms. Likely viral.  WBC count normal. GI pathogen panel and C. difficile pending. Continue to monitor Recent Labs  Lab 03/29/24 2035 03/30/24 0101 03/30/24 0716  WBC 6.5  --  7.2  LATICACIDVEN  --  0.8  --    Sore throat Respiratory virus panel and strep throat negative Start Cepacol throat lozenges Monitor symptoms.  Hypertension PTA meds- amlodipine  10 mg daily, torsemide 5 mg 3 times a week, valsartan 160 mg daily, HCTZ 25 mg daily Blood pressure meds currently on hold appropriately. Blood pressure running in normal range.  Mild chronic anemia Hemoglobin stable Continue vitamin B12 Recent Labs    09/13/23 0553 11/22/23 1711 02/02/24 2237 03/29/24 2035 03/30/24 0626 03/30/24 0716 03/30/24 1242  HGB 10.5* 13.7 11.8* 11.5*  --  11.1*  --   MCV 81.7 84.1 84.9 82.0  --  82.5  --   VITAMINB12  --   --   --   --  460  --   --   FOLATE  --   --   --   --  24.5  --   --   RETICCTPCT  --   --   --   --   --   --  1.2   Peripheral neuropathy Was on Neurontin  600 mg nightly.  On hold due to AKI    Mobility: PT eval pending  Goals of care   Code Status: Full Code     DVT prophylaxis:  SCDs Start: 03/29/24 2329   Antimicrobials: None Fluid: Sodium bicarb at 100 mL/h Consultants: None Family Communication: None at bedside  Status: Inpatient Level of care:  Progressive   Patient is from: ALF Needs to continue in-hospital care: Continue monitor for diarrhea, needs IV fluid Anticipated d/c to: Pending clinical course    Diet:  Diet Order              Diet clear liquid Fluid consistency: Thin  Diet effective now                   Scheduled Meds:    PRN meds: acetaminophen  **OR** acetaminophen , albuterol , HYDROcodone -acetaminophen , menthol -cetylpyridinium, ondansetron  **OR** ondansetron  (ZOFRAN ) IV   Infusions:   sodium bicarbonate  75 mEq in sodium chloride  0.45 % 1,075 mL infusion 100 mL/hr at 03/30/24 1023    Antimicrobials: Anti-infectives (From admission, onward)    None       Objective: Vitals:   03/30/24 0818 03/30/24 1011  BP:  114/60  Pulse:  69  Resp:  16  Temp: 97.8 F (36.6 C) 97.7 F (36.5 C)  SpO2:  100%   No intake or output data in the 24 hours ending 03/30/24 1351 Filed Weights   03/29/24 1809 03/30/24 1011  Weight: 55 kg 54.2 kg   Weight change:  Body mass index is 19.88 kg/m.   Physical Exam: General exam: Pleasant, elderly African-American female Skin: No rashes, lesions or ulcers. HEENT: Atraumatic, normocephalic, no obvious bleeding Lungs: Clear to auscultation bilaterally,  CVS: S1, S2, no murmur,   GI/Abd: Soft, nontender, nondistended, bowel sound present,   CNS: Alert, awake, slow to respond, underlying dementia Psychiatry: Sad affect Extremities: No pedal edema, no calf tenderness,   Data Review: I have personally reviewed the laboratory data and studies available.  F/u labs  Unresulted Labs (From admission, onward)     Start     Ordered   03/31/24 0500  Basic metabolic panel with GFR  Tomorrow morning,   R        03/30/24 1351   03/31/24 0500  CBC with Differential/Platelet  Tomorrow morning,   R        03/30/24 1351   03/30/24 1349  C Difficile Quick Screen w PCR reflex  (C Difficile quick screen w PCR reflex panel )  Once, for 24 hours,   TIMED       References:    CDiff Information Tool   03/30/24 1348   03/30/24 0051  Urine Culture (for pregnant, neutropenic or urologic patients or patients with an indwelling urinary catheter)  (Urine Labs)  Once,    R       Question:  Indication  Answer:  Altered mental status (if no other cause identified)   03/30/24 0050   03/29/24 2254  Creatinine, urine, random  Once,   URGENT        03/29/24 2253   03/29/24 2254  Osmolality, urine  Once,   URGENT        03/29/24 2253   03/29/24 2254  Osmolality  Add-on,   AD        03/29/24 2253   03/29/24 2254  Sodium, urine, random  Once,   URGENT        03/29/24  2253   03/29/24 2254  TSH  Add-on,   AD        03/29/24 2253   03/29/24 2254  Gastrointestinal Panel by PCR , Stool  (Gastrointestinal Panel by PCR, Stool                                                                                                                                                     **Does Not include CLOSTRIDIUM DIFFICILE testing. **If CDIFF testing is needed, place order from the C Difficile Testing order set.**)  Once,   URGENT        03/29/24 2253   03/29/24 2254  Iron and TIBC  (Anemia Panel (PNL))  Add-on,   AD        03/29/24 2253   03/29/24 2254  Ferritin  (Anemia Panel (PNL))  Add-on,   AD        03/29/24 2253   Pending  MRSA Next Gen by PCR, Nasal  Once,   R        Pending           Signed, Chapman Rota, MD [facility] 03/30/2024

## 2024-03-30 NOTE — Assessment & Plan Note (Signed)
Given soft blood pressures allow permissive hypertension

## 2024-03-30 NOTE — Assessment & Plan Note (Addendum)
 COVID and influenza negative Strep negative Supportive management May have attributed to decreased intake Patient able to swallow secretions

## 2024-03-30 NOTE — Evaluation (Signed)
 Physical Therapy Evaluation Patient Details Name: Diane Skinner MRN: [id number] DOB: 1923-04-07 Today's Date: 03/30/2024  History of Present Illness  Diane Skinner is a 89 y.o. female who presents on 03/29/24 with concerns of sore throat as well as multiple episodes of loose stools.  As noted she lives in an assisted living facility, brought to the ED by EMS.  States that she has been feeling unwell over the last week. Admitted for sore throat, diarrhea, AKI/acute metabolic acidosis PMH: primary hypertension, Alzheimer's dementia, peripheral neuropathy.  Clinical Impression  Pt admitted with above diagnosis. Pt from [facility]F per chart.  Pt reports ambulatory with rollator but is questionable historian and difficulty providing further detail about level of assist required.  Today, pt needing increased time and min A for all transfers. She did ambulate 48' with RW and min A.  Pt currently with functional limitations due to the deficits listed below (see PT Problem List). Pt will benefit from acute skilled PT to increase their independence and safety with mobility to allow discharge.  Pt does appear to be below baseline and Patient will benefit from continued inpatient follow up therapy, <3 hours/day at d/c unless her ALF able to provide min A for all transfers.           If plan is discharge home, recommend the following: A little help with walking and/or transfers;A little help with bathing/dressing/bathroom;Assistance with cooking/housework;Help with stairs or ramp for entrance   Can travel by private vehicle   Yes    Equipment Recommendations None recommended by PT  Recommendations for Other Services       Functional Status Assessment Patient has had a recent decline in their functional status and demonstrates the ability to make significant improvements in function in a reasonable and predictable amount of time.     Precautions / Restrictions Precautions Precautions: Fall       Mobility  Bed Mobility Overal bed mobility: Needs Assistance Bed Mobility: Sit to Supine       Sit to supine: Min assist   General bed mobility comments: min A for legs back to bed    Transfers Overall transfer level: Needs assistance Equipment used: Rolling walker (2 wheels) Transfers: Sit to/from Stand Sit to Stand: Min assist           General transfer comment: STS x 2; light min A    Ambulation/Gait Ambulation/Gait assistance: Min assist Gait Distance (Feet): 60 Feet Assistive device: Rolling walker (2 wheels) Gait Pattern/deviations: Step-to pattern, Decreased stride length, Trunk flexed Gait velocity: decreased     General Gait Details: cues to stay close to RW and min A navigating RW around obstacles  Stairs            Wheelchair Mobility     Tilt Bed    Modified Rankin (Stroke Patients Only)       Balance Overall balance assessment: Needs assistance Sitting-balance support: Feet supported Sitting balance-Leahy Scale: Good     Standing balance support: During functional activity, Reliant on assistive device for balance, Bilateral upper extremity supported Standing balance-Leahy Scale: Poor Standing balance comment: cues and facilitation for upright posture and hip/knee extension                             Pertinent Vitals/Pain Pain Assessment Pain Assessment: No/denies pain    Home Living Family/patient expects to be discharged to:: Assisted living  Home Equipment: Agricultural consultant (2 wheels);Rollator (4 wheels);Shower seat Additional Comments: Patient reports she lives at [facility] and was amb with rollator and able to walk around and sit out on the porch. Limited recall and no family bedside to provide history    Prior Function Prior Level of Function : Patient poor historian/Family not available;Needs assist  Cognitive Assist : Mobility (cognitive);ADLs (cognitive) Mobility  (Cognitive): Set up cues ADLs (Cognitive): Set up cues Physical Assist : Mobility (physical);ADLs (physical) Mobility (physical): Gait ADLs (physical): Bathing;Dressing;Toileting;IADLs Mobility Comments: pt reports ambulating with use of a rollator ADLs Comments: reports use of rollator and can do most little things by myself     Extremity/Trunk Assessment   Upper Extremity Assessment Upper Extremity Assessment: Defer to OT evaluation    Lower Extremity Assessment Lower Extremity Assessment: LLE deficits/detail;RLE deficits/detail RLE Deficits / Details: ROM WFL; MMT 4/5 LLE Deficits / Details: ROM WFL; MMT 4/5    Cervical / Trunk Assessment Cervical / Trunk Assessment: Kyphotic  Communication   Communication Communication: Impaired Factors Affecting Communication: Hearing impaired    Cognition Arousal: Alert Behavior During Therapy: WFL for tasks assessed/performed   PT - Cognitive impairments: History of cognitive impairments, No family/caregiver present to determine baseline, Problem solving, Memory                           Following commands impaired: Follows one step commands with increased time     Cueing Cueing Techniques: Verbal cues, Gestural cues     General Comments General comments (skin integrity, edema, etc.): VSS on RA; incontinent of urine upon standing despite urinating just prior to standing with purewick    Exercises     Assessment/Plan    PT Assessment Patient needs continued PT services  PT Problem List Decreased strength;Decreased cognition;Decreased activity tolerance;Decreased knowledge of use of DME;Decreased balance;Decreased mobility;Decreased safety awareness;Decreased knowledge of precautions       PT Treatment Interventions DME instruction;Therapeutic exercise;Gait training;Balance training;Functional mobility training;Therapeutic activities;Patient/family education;Neuromuscular re-education;Modalities    PT Goals  (Current goals can be found in the Care Plan section)  Acute Rehab PT Goals PT Goal Formulation: Patient unable to participate in goal setting Time For Goal Achievement: 04/13/24 Potential to Achieve Goals: Good    Frequency Min 2X/week     Co-evaluation               AM-PAC PT 6 Clicks Mobility  Outcome Measure Help needed turning from your back to your side while in a flat bed without using bedrails?: A Little Help needed moving from lying on your back to sitting on the side of a flat bed without using bedrails?: A Little Help needed moving to and from a bed to a chair (including a wheelchair)?: A Little Help needed standing up from a chair using your arms (e.g., wheelchair or bedside chair)?: A Little Help needed to walk in hospital room?: A Little Help needed climbing 3-5 steps with a railing? : A Little 6 Click Score: 18    End of Session Equipment Utilized During Treatment: Gait belt Activity Tolerance: Patient tolerated treatment well Patient left: in bed;with call bell/phone within reach;Other (comment) (Pt on 4th floor -automatic bed alarm not coming on, notified tech sitting outside room and she reports she will check it) Nurse Communication: Mobility status PT Visit Diagnosis: Other abnormalities of gait and mobility (R26.89);Muscle weakness (generalized) (M62.81)    Time: 8357-8291 PT Time Calculation (min) (ACUTE ONLY): 26 min  Charges:   PT Evaluation $PT Eval Low Complexity: 1 Low PT Treatments $Gait Training: 8-22 mins PT General Charges $$ ACUTE PT VISIT: 1 Visit         Benjiman, PT Acute Rehab [facility] Re[facility] [phone]   Benjiman VEAR Mulberry 03/30/2024, 5:22 PM

## 2024-03-30 NOTE — Assessment & Plan Note (Signed)
 Given soft blood pressures and some confusion we will hold off on Neurontin  for tonight

## 2024-03-30 NOTE — Assessment & Plan Note (Addendum)
 Most likely in the setting of dehydration.  Will rehydrate obtain electrolytes Hold valsartan  -chronic avoid nephrotoxic medications such as NSAIDs, Vanco Zosyn combo,  avoid hypotension, continue to follow renal function

## 2024-03-31 ENCOUNTER — Encounter (HOSPITAL_COMMUNITY): Payer: Self-pay | Admitting: Internal Medicine

## 2024-03-31 DIAGNOSIS — N179 Acute kidney failure, unspecified: Secondary | ICD-10-CM | POA: Diagnosis not present

## 2024-03-31 LAB — CBC WITH DIFFERENTIAL/PLATELET
Abs Immature Granulocytes: 0.06 K/uL (ref 0.00–0.07)
Basophils Absolute: 0 K/uL (ref 0.0–0.1)
Basophils Relative: 0 %
Eosinophils Absolute: 0 K/uL (ref 0.0–0.5)
Eosinophils Relative: 0 %
HCT: 35.4 % — ABNORMAL LOW (ref 36.0–46.0)
Hemoglobin: 11.6 g/dL — ABNORMAL LOW (ref 12.0–15.0)
Immature Granulocytes: 1 %
Lymphocytes Relative: 29 %
Lymphs Abs: 2.2 K/uL (ref 0.7–4.0)
MCH: 26.1 pg (ref 26.0–34.0)
MCHC: 32.8 g/dL (ref 30.0–36.0)
MCV: 79.7 fL — ABNORMAL LOW (ref 80.0–100.0)
Monocytes Absolute: 0.7 K/uL (ref 0.1–1.0)
Monocytes Relative: 9 %
Neutro Abs: 4.5 K/uL (ref 1.7–7.7)
Neutrophils Relative %: 61 %
Platelets: 147 K/uL — ABNORMAL LOW (ref 150–400)
RBC: 4.44 MIL/uL (ref 3.87–5.11)
RDW: 14.2 % (ref 11.5–15.5)
WBC: 7.5 K/uL (ref 4.0–10.5)
nRBC: 0 % (ref 0.0–0.2)

## 2024-03-31 LAB — BASIC METABOLIC PANEL WITH GFR
Anion gap: 11 (ref 5–15)
BUN: 49 mg/dL — ABNORMAL HIGH (ref 8–23)
CO2: 19 mmol/L — ABNORMAL LOW (ref 22–32)
Calcium: 8.3 mg/dL — ABNORMAL LOW (ref 8.9–10.3)
Chloride: 111 mmol/L (ref 98–111)
Creatinine, Ser: 2.82 mg/dL — ABNORMAL HIGH (ref 0.44–1.00)
GFR, Estimated: 15 mL/min — ABNORMAL LOW (ref >60)
Glucose, Bld: 75 mg/dL (ref 70–99)
Potassium: 4 mmol/L (ref 3.5–5.1)
Sodium: 141 mmol/L (ref 135–145)

## 2024-03-31 MED ORDER — TRAZODONE HCL 50 MG PO TABS
50.0000 mg | ORAL_TABLET | Freq: Once | ORAL | Status: AC
  Administered 2024-04-01: 50 mg via ORAL
  Filled 2024-03-31: qty 1

## 2024-03-31 MED ORDER — HALOPERIDOL LACTATE 5 MG/ML IJ SOLN
1.0000 mg | Freq: Once | INTRAMUSCULAR | Status: AC
  Administered 2024-03-31: 1 mg via INTRAVENOUS
  Filled 2024-03-31: qty 1

## 2024-03-31 MED ORDER — AMLODIPINE BESYLATE 10 MG PO TABS
10.0000 mg | ORAL_TABLET | Freq: Every day | ORAL | Status: DC
Start: 2024-03-31 — End: 2024-04-04
  Administered 2024-03-31 – 2024-04-04 (×5): 10 mg via ORAL
  Filled 2024-03-31 (×5): qty 1

## 2024-03-31 MED ORDER — SODIUM CHLORIDE 0.45 % IV SOLN
INTRAVENOUS | Status: DC
  Filled 2024-03-31 (×3): qty 75

## 2024-03-31 NOTE — Progress Notes (Addendum)
 Pt is very agitated and confuse, Oriented to self only. Pt constantly trying to getting out of bed, yelling and pulling  heart monitor cables. [id number] Staff tried to reorient pt but was unsuccessful. Pt is un redirectable.This RN place mitts on pt.NP Andrez made aware. Given pt Patient 1 mg of haldol  IV per order. Pt refusing to take oral trazodone . Will continue to monitor pt.

## 2024-03-31 NOTE — TOC Initial Note (Signed)
 Transition of Care [facility]) - Initial/Assessment Note    Patient Details  Name: Diane Skinner MRN: [id number] Date of Birth: 1922/11/21  Transition of Care [facility]) CM/SW Contact:    Tawni CHRISTELLA Eva, LCSW Phone Number: 03/31/2024, 2:01 PM  Clinical Narrative:                  CSW spoke with the patient's niece regarding recommendations for rehab. The patient's niece is agreeable with the recommendations and expressed interest in [facility] or [facility]. CSW explained the process, noting that insurance authorization will be required. CSW to fax referral for SNF placement.  Expected Discharge Plan: Skilled Nursing Facility Barriers to Discharge: Continued Medical Work up, SNF Pending bed offer, Insurance Authorization   Patient Goals and CMS Choice Patient states their goals for this hospitalization and ongoing recovery are:: SNf to get stonger CMS Medicare.gov Compare Post Acute Care list provided to:: Patient Represenative (must comment) Choice offered to / list presented to : [facility] POA / Guardian      Expected Discharge Plan and Services       Living arrangements for the past 2 months: Independent Living Facility                                      Prior Living Arrangements/Services Living arrangements for the past 2 months: Independent Living Facility Lives with:: Self Patient language and need for interpreter reviewed:: Yes Do you feel safe going back to the place where you live?: Yes      Need for Family Participation in Patient Care: Yes (Comment) Care giver support system in place?: Yes (comment)   Criminal Activity/Legal Involvement Pertinent to Current Situation/Hospitalization: No - Comment as needed  Activities of Daily Living   ADL Screening (condition at time of admission) Independently performs ADLs?: No Does the patient have a NEW difficulty with bathing/dressing/toileting/self-feeding that is expected to last >3 days?: No Does the patient  have a NEW difficulty with getting in/out of bed, walking, or climbing stairs that is expected to last >3 days?: No Does the patient have a NEW difficulty with communication that is expected to last >3 days?: No Is the patient deaf or have difficulty hearing?: Yes Does the patient have difficulty seeing, even when wearing glasses/contacts?: No Does the patient have difficulty concentrating, remembering, or making decisions?: Yes  Permission Sought/Granted                  Emotional Assessment Appearance:: Appears stated age Attitude/Demeanor/Rapport: Gracious Affect (typically observed): Accepting Orientation: : Oriented to Self, Oriented to  Time      Admission diagnosis:  AKI (acute kidney injury) (HCC) [N17.9] Pharyngitis, unspecified etiology [J02.9] Patient Active Problem List   Diagnosis Date Noted   Diarrhea 03/30/2024   Sore throat 03/30/2024   AKI (acute kidney injury) (HCC) 03/29/2024   Rib pain on right side 11/17/2023   Alzheimer's dementia without behavioral disturbance (HCC) 09/12/2023   Acute kidney injury superimposed on chronic kidney disease (HCC) - stage 4. baseline Scr 1.6-2.0 09/11/2023   Tremor 09/11/2023   Weakness 09/11/2023   Hypocalcemia 09/11/2023   Hyperkalemia 09/11/2023   Normocytic anemia 09/11/2023   Depression 09/11/2023   Pressure injury of skin 12/08/2022   Unwitnessed fall 12/05/2022   Peripheral neuropathy 12/05/2022   Sacral wound, initial encounter 04/17/2021   Chronic sinus bradycardia 04/17/2021   Thrombocytopenia (HCC) 03/15/2021   Renal disorder  Essential hypertension    PCP:  Benjamine Aland, MD Pharmacy:   [facility] [address]  - [address] [place] [address] Phone: [phone] Fax: [phone]  [facility] [address] [place] [address] Phone: [phone] Fax: [phone]  [facility] - [place], [place] - [address] [facility] [address] [place] [address] Phone: [phone] Fax: [phone]     Social Drivers of Health (SDOH) Social History: SDOH Screenings   Food Insecurity: No Food Insecurity (03/30/2024)  Housing: Low Risk  (03/30/2024)  Transportation Needs: No Transportation Needs (03/30/2024)  Utilities: Not At Risk (03/30/2024)  Social Connections: Moderately Isolated (03/30/2024)  Tobacco Use: Low Risk  (03/31/2024)   SDOH Interventions:     Readmission Risk Interventions    09/14/2023   10:22 AM  Readmission Risk Prevention Plan  Transportation Screening Complete  PCP or Specialist Appt within 5-7 Days Complete  Home Care Screening Complete  Medication Review (RN CM) Complete

## 2024-03-31 NOTE — Progress Notes (Signed)
 PROGRESS NOTE  ELESHA THEDFORD  DOB: 12/05/1922  PCP: Benjamine Aland, MD FMW:[id number]  DOA: 03/29/2024  LOS: 1 day  Hospital Day: 3  Brief narrative: Diane Skinner is a 89 y.o. female with PMH significant for dementia, hypertension, arthritis, peripheral neuropathy Patient lives in an assisted living facility  7/8, patient was brought to the ED by EMS with complaint of sore throat for a week, and multiple episode of loose stools and progressive fatigue.  In the ED, patient was afebrile, her temperature has been actually on the lower side to 97 and 98. Blood pressure 90s and low 100s Initial labs with WC count 6.5, hemoglobin 11.5, lactic acid 0.8, BUN/creatinine 63/4.06 Respiratory virus panel unremarkable Group A streptococcal PCR negative Urinalysis showed hazy yellow urine with small leukocytes, few bacteria Urine culture sent CT abdomen pelvis showed infiltration or atelectasis in the lung bases. Possible wall thickening the polar region of the stomach suggesting gastritis or possibly peptic ulcer disease.  Patient was started on IV fluid Admitted to [facility]  Subjective: Patient was seen and examined this morning.. Pleasant elderly African-American female.  Sitting up in recliner.  Not in distress  alert, awake, used very minimal words.  Likely due to underlying dementia.  Not in distress.  Family not at bedside.  Assessment and plan: AKI on CKD 4 Acute metabolic acidosis Baseline creatinine 2 from May 2025.  Presented with creatinine elevated to 4.06 secondary to dehydration from GI loss.  Creatinine is improving with IV fluid. With sodium bicarb drip, serum bicarb level is also improving. Recent Labs    09/11/23 1000 09/12/23 0553 09/13/23 0553 11/22/23 1835 02/02/24 2237 03/29/24 2035 03/30/24 0626 03/31/24 0414  BUN 48* 47* 38* 43* 29* 63* 59* 49*  CREATININE 2.69* 2.32* 2.09* 2.10* 2.02* 4.06* 2.94* 2.82*  CO2 19* 19* 22 21* 24 16* 12* 19*    Hyperphosphatemia Phosphorus elevated 5.1 likely in the setting of CKD.   Repeat phosphorus level tomorrow Recent Labs  Lab 03/29/24 2035 03/30/24 0626 03/30/24 1242 03/31/24 0414  K 4.3 3.8  --  4.0  MG  --  1.9 2.0  --   PHOS  --  5.1* 5.1*  --    Diarrhea Reported diarrhea for a week CT scan without any specific etiology, suggested gastritis which is less likely be the cause of her symptoms. Likely viral. WBC count normal. GI pathogen panel and C. difficile pending. Continue to monitor Recent Labs  Lab 03/29/24 2035 03/30/24 0101 03/30/24 0716 03/31/24 0414  WBC 6.5  --  7.2 7.5  LATICACIDVEN  --  0.8  --   --    Sore throat Respiratory virus panel and strep throat negative Continue Cepacol throat lozenges Monitor symptoms.  Hypertension PTA meds- amlodipine  10 mg daily, torsemide 5 mg 3 times a week, valsartan 160 mg daily, HCTZ 25 mg daily Blood pressure meds currently on hold  Blood pressure gradually uptrending.  Resume amlodipine  10 mg daily today.  Mild chronic anemia Hemoglobin stable Continue vitamin B12 Recent Labs    11/22/23 1711 02/02/24 2237 03/29/24 2035 03/30/24 0626 03/30/24 0716 03/30/24 1242 03/31/24 0414  HGB 13.7 11.8* 11.5*  --  11.1*  --  11.6*  MCV 84.1 84.9 82.0  --  82.5  --  79.7*  VITAMINB12  --   --   --  460  --   --   --   FOLATE  --   --   --  24.5  --   --   --  FERRITIN  --   --   --   --   --  37  --   TIBC  --   --   --   --   --  223*  --   IRON  --   --   --   --   --  77  --   RETICCTPCT  --   --   --   --   --  1.2  --    Peripheral neuropathy Was on Neurontin  600 mg nightly.  On hold due to AKI   Impaired mobility  PT eval obtained.  SNF recommended  Goals of care   Code Status: Full Code     DVT prophylaxis:  SCDs Start: 03/29/24 2329   Antimicrobials: None Fluid: Sodium bicarb at 100 mL/h.  Reduced rate to 50 mL/h Consultants: None Family Communication: None at bedside  Status:  Inpatient Level of care:  Progressive   Patient is from: ALF Needs to continue in-hospital care: Continue monitor for diarrhea, needs IV fluid Anticipated d/c to: Pending clinical course    Diet:  Diet Order             DIET DYS 3 Room service appropriate? Yes; Fluid consistency: Thin  Diet effective now                   Scheduled Meds:  amLODipine   10 mg Oral Daily     PRN meds: acetaminophen  **OR** acetaminophen , albuterol , HYDROcodone -acetaminophen , menthol -cetylpyridinium, ondansetron  **OR** ondansetron  (ZOFRAN ) IV   Infusions:   sodium bicarbonate  75 mEq in sodium chloride  0.45 % 1,075 mL infusion       Antimicrobials: Anti-infectives (From admission, onward)    None       Objective: Vitals:   03/31/24 0611 03/31/24 1249  BP: (!) 147/75 (!) 143/77  Pulse: 68 68  Resp: 14 19  Temp: 98.3 F (36.8 C) (!) 97.5 F (36.4 C)  SpO2: 100% 100%    Intake/Output Summary (Last 24 hours) at 03/31/2024 1401 Last data filed at 03/31/2024 0612 Gross per 24 hour  Intake 2292.25 ml  Output 1300 ml  Net 992.25 ml   Filed Weights   03/29/24 1809 03/30/24 1011  Weight: 55 kg 54.2 kg   Weight change: -0.8 kg Body mass index is 19.88 kg/m.   Physical Exam: General exam: Pleasant, elderly African-American female.  Not in pain Skin: No rashes, lesions or ulcers. HEENT: Atraumatic, normocephalic, no obvious bleeding Lungs: Clear to auscultation bilaterally,  CVS: S1, S2, no murmur,   GI/Abd: Soft, nontender, nondistended, bowel sound present,   CNS: Alert, awake, slow to respond, underlying dementia Psychiatry: Sad affect Extremities: No pedal edema, no calf tenderness,   Data Review: I have personally reviewed the laboratory data and studies available.  F/u labs  Unresulted Labs (From admission, onward)     Start     Ordered   04/01/24 0500  Phosphorus  Tomorrow morning,   R       Question:  Specimen collection method  Answer:  Lab=Lab collect    03/31/24 1359   04/01/24 0500  CBC with Differential/Platelet  Tomorrow morning,   R       Question:  Specimen collection method  Answer:  Lab=Lab collect   03/31/24 1401   04/01/24 0500  Basic metabolic panel with GFR  Tomorrow morning,   R       Question:  Specimen collection method  Answer:  Lab=Lab collect  03/31/24 1401   03/30/24 1349  C Difficile Quick Screen w PCR reflex  (C Difficile quick screen w PCR reflex panel )  Once, for 24 hours,   TIMED       References:    CDiff Information Tool   03/30/24 1348   Pending  MRSA Next Gen by PCR, Nasal  Once,   R        Pending           Signed, Chapman Rota, MD [facility] 03/31/2024

## 2024-03-31 NOTE — NC FL2 (Signed)
 [place]  MEDICAID FL2 LEVEL OF CARE FORM     IDENTIFICATION  Patient Name: Diane Skinner Birthdate: 22-Feb-1923 Sex: female Admission Date (Current Location): 03/29/2024  [facility] and [place] Number:  Producer, television/film/video and Address:  [facility],  [address] [place], [place] [address]      Provider Number: [id number]  Attending Physician Name and Address:  Arlice Reichert, MD  Relative Name and Phone Number:  Gildardo Boast (Niece)  [phone] [facility])    Current Level of Care: Hospital Recommended Level of Care: Skilled Nursing Facility Prior Approval Number:    Date Approved/Denied:   PASRR Number: [id number]  Discharge Plan: SNF    Current Diagnoses: Patient Active Problem List   Diagnosis Date Noted   Diarrhea 03/30/2024   Sore throat 03/30/2024   AKI (acute kidney injury) (HCC) 03/29/2024   Rib pain on right side 11/17/2023   Alzheimer's dementia without behavioral disturbance (HCC) 09/12/2023   Acute kidney injury superimposed on chronic kidney disease (HCC) - stage 4. baseline Scr 1.6-2.0 09/11/2023   Tremor 09/11/2023   Weakness 09/11/2023   Hypocalcemia 09/11/2023   Hyperkalemia 09/11/2023   Normocytic anemia 09/11/2023   Depression 09/11/2023   Pressure injury of skin 12/08/2022   Unwitnessed fall 12/05/2022   Peripheral neuropathy 12/05/2022   Sacral wound, initial encounter 04/17/2021   Chronic sinus bradycardia 04/17/2021   Thrombocytopenia (HCC) 03/15/2021   Renal disorder    Essential hypertension     Orientation RESPIRATION BLADDER Height & Weight     Self, Time  Normal Incontinent Weight: 119 lb 7.8 oz (54.2 kg) Height:  5' 5 (165.1 cm)  BEHAVIORAL SYMPTOMS/MOOD NEUROLOGICAL BOWEL NUTRITION STATUS      Continent Diet (DSY3)  AMBULATORY STATUS COMMUNICATION OF NEEDS Skin   Limited Assist Verbally Normal                       Personal Care Assistance Level of Assistance  Bathing, Feeding, Dressing Bathing  Assistance: Limited assistance Feeding assistance: Independent Dressing Assistance: Limited assistance     Functional Limitations Info  Sight, Hearing, Speech Sight Info: Adequate Hearing Info: Adequate Speech Info: Adequate    SPECIAL CARE FACTORS FREQUENCY  OT (By licensed OT), PT (By licensed PT)     PT Frequency: 5 x a week OT Frequency: 5 x a week            Contractures Contractures Info: Not present    Additional Factors Info  Code Status, Allergies Code Status Info: full Allergies Info: Celexa  (Citalopram )  Ibuprofen  Valsartan-hydrochlorothiazide           Current Medications (03/31/2024):  This is the current hospital active medication list Current Facility-Administered Medications  Medication Dose Route Frequency Provider Last Rate Last Admin   acetaminophen  (TYLENOL ) tablet 650 mg  650 mg Oral Q6H PRN Doutova, Anastassia, MD       Or   acetaminophen  (TYLENOL ) suppository 650 mg  650 mg Rectal Q6H PRN Doutova, Anastassia, MD       albuterol  (PROVENTIL ) (2.5 MG/3ML) 0.083% nebulizer solution 2.5 mg  2.5 mg Nebulization Q6H PRN Doutova, Anastassia, MD       amLODipine  (NORVASC ) tablet 10 mg  10 mg Oral Daily Dahal, Binaya, MD   10 mg at 03/31/24 1536   HYDROcodone -acetaminophen  (NORCO/VICODIN) 5-325 MG per tablet 1-2 tablet  1-2 tablet Oral Q4H PRN Doutova, Anastassia, MD       menthol -cetylpyridinium (CEPACOL) lozenge 3 mg  1 lozenge Oral  PRN Doutova, Anastassia, MD       ondansetron  (ZOFRAN ) tablet 4 mg  4 mg Oral Q6H PRN Doutova, Anastassia, MD       Or   ondansetron  (ZOFRAN ) injection 4 mg  4 mg Intravenous Q6H PRN Doutova, Anastassia, MD       sodium bicarbonate  75 mEq in sodium chloride  0.45 % 1,075 mL infusion   Intravenous Continuous Dahal, Chapman, MD 50 mL/hr at 03/31/24 1522 New Bag at 03/31/24 1522     Discharge Medications: Please see discharge summary for a list of discharge medications.  Relevant Imaging Results:  Relevant Lab  Results:   Additional Information SSN:[id number]  Tawni HERO Vito Beg, LCSW

## 2024-04-01 DIAGNOSIS — N179 Acute kidney failure, unspecified: Secondary | ICD-10-CM | POA: Diagnosis not present

## 2024-04-01 LAB — CBC WITH DIFFERENTIAL/PLATELET
Abs Immature Granulocytes: 0.03 K/uL (ref 0.00–0.07)
Basophils Absolute: 0 K/uL (ref 0.0–0.1)
Basophils Relative: 0 %
Eosinophils Absolute: 0 K/uL (ref 0.0–0.5)
Eosinophils Relative: 0 %
HCT: 32.8 % — ABNORMAL LOW (ref 36.0–46.0)
Hemoglobin: 10.7 g/dL — ABNORMAL LOW (ref 12.0–15.0)
Immature Granulocytes: 1 %
Lymphocytes Relative: 23 %
Lymphs Abs: 1.5 K/uL (ref 0.7–4.0)
MCH: 26.4 pg (ref 26.0–34.0)
MCHC: 32.6 g/dL (ref 30.0–36.0)
MCV: 80.8 fL (ref 80.0–100.0)
Monocytes Absolute: 0.6 K/uL (ref 0.1–1.0)
Monocytes Relative: 10 %
Neutro Abs: 4.2 K/uL (ref 1.7–7.7)
Neutrophils Relative %: 66 %
Platelets: 154 K/uL (ref 150–400)
RBC: 4.06 MIL/uL (ref 3.87–5.11)
RDW: 14.2 % (ref 11.5–15.5)
WBC: 6.4 K/uL (ref 4.0–10.5)
nRBC: 0 % (ref 0.0–0.2)

## 2024-04-01 LAB — BASIC METABOLIC PANEL WITH GFR
Anion gap: 9 (ref 5–15)
BUN: 42 mg/dL — ABNORMAL HIGH (ref 8–23)
CO2: 20 mmol/L — ABNORMAL LOW (ref 22–32)
Calcium: 8.3 mg/dL — ABNORMAL LOW (ref 8.9–10.3)
Chloride: 114 mmol/L — ABNORMAL HIGH (ref 98–111)
Creatinine, Ser: 2.3 mg/dL — ABNORMAL HIGH (ref 0.44–1.00)
GFR, Estimated: 19 mL/min — ABNORMAL LOW (ref >60)
Glucose, Bld: 95 mg/dL (ref 70–99)
Potassium: 3.6 mmol/L (ref 3.5–5.1)
Sodium: 143 mmol/L (ref 135–145)

## 2024-04-01 LAB — URINE CULTURE: Culture: 30000 — AB

## 2024-04-01 LAB — PHOSPHORUS: Phosphorus: 3.3 mg/dL (ref 2.5–4.6)

## 2024-04-01 MED ORDER — ENSURE PLUS HIGH PROTEIN PO LIQD
237.0000 mL | Freq: Two times a day (BID) | ORAL | Status: DC
  Administered 2024-04-01 – 2024-04-02 (×2): 237 mL via ORAL

## 2024-04-01 MED ORDER — IRBESARTAN 150 MG PO TABS
150.0000 mg | ORAL_TABLET | Freq: Every day | ORAL | Status: DC
  Administered 2024-04-01 – 2024-04-04 (×4): 150 mg via ORAL
  Filled 2024-04-01 (×4): qty 1

## 2024-04-01 MED ORDER — TRAZODONE HCL 50 MG PO TABS
50.0000 mg | ORAL_TABLET | Freq: Every day | ORAL | Status: DC
  Administered 2024-04-01 – 2024-04-03 (×3): 50 mg via ORAL
  Filled 2024-04-01 (×3): qty 1

## 2024-04-01 NOTE — Progress Notes (Signed)
 Occupational Therapy Treatment Patient Details Name: Diane Skinner MRN: [id number] DOB: 10-Nov-1922 Today's Date: 04/01/2024   History of present illness Diane Skinner is a 89 yr old female who presents on 03/29/24 with concerns of sore throat as well as multiple episodes of loose stools.  As noted she lives in an assisted living facility, brought to the ED by EMS.  States that she has been feeling unwell over the last week. Admitted for sore throat, diarrhea, AKI/acute metabolic acidosis PMH: primary hypertension, Alzheimer's dementia, peripheral neuropathy.   OT comments  Pt was seen for functional strengthening and progression of ADL participation. She required CGA to min assist for toileting management at bathroom level and for grooming in standing at sink level. She will continue to benefit from OT services to maximize her independence with self care tasks. Patient will benefit from continued inpatient follow up therapy, <3 hours/day.       If plan is discharge home, recommend the following:  Direct supervision/assist for medications management;Direct supervision/assist for financial management;Assist for transportation;Help with stairs or ramp for entrance;Supervision due to cognitive status;A little help with walking and/or transfers;A little help with bathing/dressing/bathroom   Equipment Recommendations  None recommended by OT    Recommendations for Other Services      Precautions / Restrictions Precautions Precautions: Fall Restrictions Weight Bearing Restrictions Per Provider Order: No       Mobility Bed Mobility Overal bed mobility: Needs Assistance Bed Mobility: Supine to Sit, Sit to Supine     Supine to sit: HOB elevated, Used rails, Min assist Sit to supine: Supervision        Transfers Overall transfer level: Needs assistance Equipment used: Rolling walker (2 wheels) Transfers: Sit to/from Stand Sit to Stand: From elevated surface, Contact guard assist                  Balance     Sitting balance-Leahy Scale: Good         Standing balance comment: CGA to min assist with RW              ADL either performed or assessed with clinical judgement   ADL Overall ADL's : Needs assistance/impaired     Grooming: Standing;Contact guard assist;Wash/dry hands;Wash/dry face Grooming Details (indicate cue type and reason): The pt was instructed on performing grooming at sink level. OT prompted her to step closer to the sink to perform tasks for added safety.                 Toilet Transfer: Minimal assistance;Regular Toilet;Rolling walker (2 wheels);Ambulation Toilet Transfer Details (indicate cue type and reason): She performed a toilet transfer at bathroom level, requiring light cueing for walker placement and use of grab bar as needed. Toileting- Clothing Manipulation and Hygiene: Minimal assistance;Sit to/from stand;Cueing for safety Toileting - Clothing Manipulation Details (indicate cue type and reason): The pt performed toileting at bathroom level. She performed seated anterior hygiene with SBA and subsequently needed intermittent steadying assist in standing and min assist for clothing management. She was also cued to correct occasional forward flexed posture.             Communication Communication Factors Affecting Communication: Hearing impaired   Cognition Arousal: Alert   Cognition: History of cognitive impairments          Following commands impaired:  (Follows 1 step commands with slightly increased time)      Cueing   Cueing Techniques: Verbal cues  General Comments VSS on RA    Pertinent Vitals/ Pain       Pain Assessment Pain Assessment: No/denies pain     Prior Functioning/Environment    Frequency  Min 2X/week        Progress Toward Goals  OT Goals(current goals can now be found in the care plan section)     Acute Rehab OT Goals OT Goal Formulation: With patient Time  For Goal Achievement: 04/13/24 Potential to Achieve Goals: Good  Plan         AM-PAC OT 6 Clicks Daily Activity     Outcome Measure   Help from another person eating meals?: None Help from another person taking care of personal grooming?: A Little Help from another person toileting, which includes using toliet, bedpan, or urinal?: A Little Help from another person bathing (including washing, rinsing, drying)?: A Lot Help from another person to put on and taking off regular upper body clothing?: A Little Help from another person to put on and taking off regular lower body clothing?: A Lot 6 Click Score: 17    End of Session Equipment Utilized During Treatment: Rolling walker (2 wheels);Gait belt  OT Visit Diagnosis: Unsteadiness on feet (R26.81);Other abnormalities of gait and mobility (R26.89);Muscle weakness (generalized) (M62.81)   Activity Tolerance Other (comment) (Fair+ tolerance)   Patient Left in bed;with bed alarm set;with call bell/phone within reach   Nurse Communication Mobility status        Time: 8364-8351 OT Time Calculation (min): 13 min  Charges: OT General Charges $OT Visit: 1 Visit OT Treatments $Self Care/Home Management : 8-22 mins    Delanna LITTIE Molt, OTR/L 04/01/2024, 5:26 PM

## 2024-04-01 NOTE — Progress Notes (Signed)
 PROGRESS NOTE  Diane Skinner  DOB: 10-01-1922  PCP: Benjamine Aland, MD FMW:[id number]  DOA: 03/29/2024  LOS: 2 days  Hospital Day: 4  Brief narrative: Diane Skinner is a 89 y.o. female with PMH significant for dementia, hypertension, arthritis, peripheral neuropathy Patient lives in an assisted living facility  7/8, patient was brought to the ED by EMS with complaint of sore throat for a week, and multiple episode of loose stools and progressive fatigue.  In the ED, patient was afebrile, her temperature has been actually on the lower side to 97 and 98. Blood pressure 90s and low 100s Initial labs with WC count 6.5, hemoglobin 11.5, lactic acid 0.8, BUN/creatinine 63/4.06 Respiratory virus panel unremarkable Group A streptococcal PCR negative Urinalysis showed hazy yellow urine with small leukocytes, few bacteria Urine culture sent CT abdomen pelvis showed infiltration or atelectasis in the lung bases. Possible wall thickening the polar region of the stomach suggesting gastritis or possibly peptic ulcer disease.  Patient was started on IV fluid Admitted to [facility]  Subjective: Patient was seen and examined this morning.. Noted agitation last night, required Haldol , trazodone  At the time of my evaluation this morning, patient was somnolent, moved her extremities on touch.  Mumbled and fell right back asleep.  Family not at bedside. Per nursing staff, she has not had any diarrhea since yesterday.  Chart reviewed. Afebrile, blood pressure in 140s and 150s. Labs from this morning showed improvement in creatinine to 2.3  Assessment and plan: AKI on CKD 4 Acute metabolic acidosis Baseline creatinine 2 from May 2025.  Presented with creatinine elevated to 4.06 secondary to dehydration from GI loss.  Creatinine is improving with IV fluid. With sodium bicarb drip, serum bicarb level is also improving.  Okay to stop IV hydration today. Recent Labs    09/11/23 1000 09/12/23 0553  09/13/23 0553 11/22/23 1835 02/02/24 2237 03/29/24 2035 03/30/24 0626 03/31/24 0414 04/01/24 0356  BUN 48* 47* 38* 43* 29* 63* 59* 49* 42*  CREATININE 2.69* 2.32* 2.09* 2.10* 2.02* 4.06* 2.94* 2.82* 2.30*  CO2 19* 19* 22 21* 24 16* 12* 19* 20*   Hyperphosphatemia Phosphorus elevated 5.1 likely in the setting of CKD.   Repeat phosphorus level this morning shows normalization Recent Labs  Lab 03/29/24 2035 03/30/24 0626 03/30/24 1242 03/31/24 0414 04/01/24 0356  K 4.3 3.8  --  4.0 3.6  MG  --  1.9 2.0  --   --   PHOS  --  5.1* 5.1*  --  3.3   Viral gastroenteritis Presented with diarrhea for a week CT scan without any specific etiology, suggested gastritis which is less likely be the cause of her symptoms. Likely viral. WBC count normal. GI pathogen panel and C. difficile was ordered but diarrhea improved and hence sample was not sent. Continue to monitor Recent Labs  Lab 03/29/24 2035 03/30/24 0101 03/30/24 0716 03/31/24 0414 04/01/24 0356  WBC 6.5  --  7.2 7.5 6.4  LATICACIDVEN  --  0.8  --   --   --    Acute metabolic encephalopathy Underlying dementia Patient has known dementia and unable to have meaningful conversation. Patient was agitated, restless last night.  Probably sundowning in the setting of dementia. Required as needed Haldol , trazodone  Ordered for his scheduled trazodone  for tonight Continue to monitor mental status  Sore throat Respiratory virus panel and strep throat negative Continue Cepacol throat lozenges Improved symptoms  Hypertension PTA meds- amlodipine  10 mg daily, torsemide 5 mg 3 times  a week, valsartan 160 mg daily, HCTZ 25 mg daily Currently on amlodipine  only.  Others on hold Blood pressure gradually uptrending.  Resume ARB today.  Mild chronic anemia Hemoglobin stable Continue vitamin B12 Recent Labs    02/02/24 2237 03/29/24 2035 03/30/24 0626 03/30/24 0716 03/30/24 1242 03/31/24 0414 04/01/24 0356  HGB 11.8* 11.5*   --  11.1*  --  11.6* 10.7*  MCV 84.9 82.0  --  82.5  --  79.7* 80.8  VITAMINB12  --   --  460  --   --   --   --   FOLATE  --   --  24.5  --   --   --   --   FERRITIN  --   --   --   --  37  --   --   TIBC  --   --   --   --  223*  --   --   IRON  --   --   --   --  77  --   --   RETICCTPCT  --   --   --   --  1.2  --   --    Peripheral neuropathy Was on Neurontin  600 mg nightly.  Was on hold due to AKI   Impaired mobility  PT eval obtained.  SNF recommended  Goals of care   Code Status: Full Code     DVT prophylaxis:  SCDs Start: 03/29/24 2329   Antimicrobials: None Fluid: Stop sodium bicarb today. Consultants: None Family Communication: None at bedside  Status: Inpatient Level of care:  Progressive   Patient is from: ALF Needs to continue in-hospital care: Monitor mental status Anticipated d/c to: SNF placement recommended by PT   Diet:  Diet Order             DIET DYS 3 Room service appropriate? Yes; Fluid consistency: Thin  Diet effective now                   Scheduled Meds:  amLODipine   10 mg Oral Daily   feeding supplement  237 mL Oral BID BM   irbesartan   150 mg Oral Daily     PRN meds: acetaminophen  **OR** acetaminophen , albuterol , HYDROcodone -acetaminophen , menthol -cetylpyridinium, ondansetron  **OR** ondansetron  (ZOFRAN ) IV   Infusions:      Antimicrobials: Anti-infectives (From admission, onward)    None       Objective: Vitals:   04/01/24 0628 04/01/24 1156  BP: (!) 157/77 (!) 152/68  Pulse: 71 61  Resp: 16 16  Temp: 97.7 F (36.5 C) 97.7 F (36.5 C)  SpO2: 100% 100%    Intake/Output Summary (Last 24 hours) at 04/01/2024 1334 Last data filed at 04/01/2024 1100 Gross per 24 hour  Intake 876.74 ml  Output 900 ml  Net -23.26 ml   Filed Weights   03/29/24 1809 03/30/24 1011  Weight: 55 kg 54.2 kg   Weight change:  Body mass index is 19.88 kg/m.   Physical Exam: General exam: Pleasant, elderly African-American  female.  Not in pain Skin: No rashes, lesions or ulcers. HEENT: Atraumatic, normocephalic, no obvious bleeding Lungs: Clear to auscultation bilaterally,  CVS: S1, S2, no murmur,   GI/Abd: Soft, nontender, nondistended, bowel sound present,   CNS: Somnolent today demented at baseline Psychiatry: Sad affect Extremities: No pedal edema, no calf tenderness,   Data Review: I have personally reviewed the laboratory data and studies available.  F/u labs  [employer] (  From admission, onward)     Start     Ordered   03/30/24 1349  C Difficile Quick Screen w PCR reflex  (C Difficile quick screen w PCR reflex panel )  Once, for 24 hours,   TIMED       References:    CDiff Information Tool   03/30/24 1348           Signed, Chapman Rota, MD [facility] 04/01/2024

## 2024-04-01 NOTE — Progress Notes (Signed)
 Physical Therapy Treatment Patient Details Name: Diane Skinner MRN: [id number] DOB: [birth year] Today's Date: 04/01/2024   History of Present Illness Diane Skinner is a 89 y.o. female who presents on 03/29/24 with concerns of sore throat as well as multiple episodes of loose stools.  As noted she lives in an assisted living facility, brought to the ED by EMS.  States that she has been feeling unwell over the last week. Admitted for sore throat, diarrhea, AKI/acute metabolic acidosis PMH: primary hypertension, Alzheimer's dementia, peripheral neuropathy.    PT Comments  Gradual improvement in ambulation distance and transfers, but still needs min A and cues.  Pt fatigued easily and wanting to return to bed.  Cont POC.  Patient will benefit from continued inpatient follow up therapy, <3 hours/day at d/c.      If plan is discharge home, recommend the following: A little help with walking and/or transfers;A little help with bathing/dressing/bathroom;Assistance with cooking/housework;Help with stairs or ramp for entrance   Can travel by private vehicle     Yes  Equipment Recommendations  None recommended by PT    Recommendations for Other Services       Precautions / Restrictions Precautions Precautions: Fall     Mobility  Bed Mobility Overal bed mobility: Needs Assistance Bed Mobility: Sit to Supine       Sit to supine: Min assist   General bed mobility comments: min A for legs back to bed    Transfers Overall transfer level: Needs assistance Equipment used: Rolling walker (2 wheels) Transfers: Sit to/from Stand Sit to Stand: Min assist           General transfer comment: STS x 2; light min A    Ambulation/Gait Ambulation/Gait assistance: Min assist Gait Distance (Feet): 100 Feet Assistive device: Rolling walker (2 wheels) Gait Pattern/deviations: Step-to pattern, Decreased stride length, Trunk flexed Gait velocity: decreased     General Gait Details: cues  to stay close to RW and min A navigating RW around obstacles   Stairs             Wheelchair Mobility     Tilt Bed    Modified Rankin (Stroke Patients Only)       Balance Overall balance assessment: Needs assistance Sitting-balance support: Feet supported Sitting balance-Leahy Scale: Good     Standing balance support: During functional activity, Reliant on assistive device for balance, Bilateral upper extremity supported Standing balance-Leahy Scale: Poor Standing balance comment: cues and facilitation for upright posture and hip/knee extension                            Communication    Cognition Arousal: Alert Behavior During Therapy: WFL for tasks assessed/performed   PT - Cognitive impairments: History of cognitive impairments, No family/caregiver present to determine baseline, Problem solving, Memory                       PT - Cognition Comments: Pt was agitated last night and given haldol  but cooperative now        Cueing    Exercises      General Comments General comments (skin integrity, edema, etc.): VSS on RA      Pertinent Vitals/Pain Pain Assessment Pain Assessment: No/denies pain    Home Living  Prior Function            PT Goals (current goals can now be found in the care plan section) Progress towards PT goals: Progressing toward goals    Frequency    Min 2X/week      PT Plan      Co-evaluation              AM-PAC PT 6 Clicks Mobility   Outcome Measure  Help needed turning from your back to your side while in a flat bed without using bedrails?: A Little Help needed moving from lying on your back to sitting on the side of a flat bed without using bedrails?: A Little Help needed moving to and from a bed to a chair (including a wheelchair)?: A Little Help needed standing up from a chair using your arms (e.g., wheelchair or bedside chair)?: A Little Help needed  to walk in hospital room?: A Little Help needed climbing 3-5 steps with a railing? : A Little 6 Click Score: 18    End of Session Equipment Utilized During Treatment: Gait belt Activity Tolerance: Patient tolerated treatment well (Tolerated well but reports wants to return to bed and no further exercise) Patient left: in bed;with call bell/phone within reach;with bed alarm set Nurse Communication: Mobility status PT Visit Diagnosis: Other abnormalities of gait and mobility (R26.89);Muscle weakness (generalized) (M62.81)     Time: 8389-8374 PT Time Calculation (min) (ACUTE ONLY): 15 min  Charges:    $Gait Training: 8-22 mins PT General Charges $$ ACUTE PT VISIT: 1 Visit                     Benjiman, PT Acute Rehab Services [facility] [phone]    Benjiman VEAR Mulberry 04/01/2024, 5:00 PM

## 2024-04-01 NOTE — TOC Progression Note (Addendum)
 Transition of Care [facility]) - Progression Note    Patient Details  Name: RODINA PINALES MRN: [id number] Date of Birth: 12-17-1922  Transition of Care [facility]) CM/SW Contact  Tawni CHRISTELLA Eva, LCSW Phone Number: 04/01/2024, 12:55 PM  Clinical Narrative:     CSW spoke with pt's niece to present bed offers she is requesting time to review. TOC to follow.   Adden  2:30pm Pt's niece has chosen [facility]. CSW spoke with Darian admission with [facility] to inform her about pt's choice. Darian stated pt can admit tomorrow before 12pm if authorization is approved. Auth pending , TOC to follow.    Expected Discharge Plan: Skilled Nursing Facility Barriers to Discharge: Continued Medical Work up, SNF Pending bed offer, Insurance Authorization  Expected Discharge Plan and Services       Living arrangements for the past 2 months: Independent Living Facility                                       Social Determinants of Health (SDOH) Interventions SDOH Screenings   Food Insecurity: No Food Insecurity (03/30/2024)  Housing: Low Risk  (03/30/2024)  Transportation Needs: No Transportation Needs (03/30/2024)  Utilities: Not At Risk (03/30/2024)  Social Connections: Moderately Isolated (03/30/2024)  Tobacco Use: Low Risk  (03/31/2024)    Readmission Risk Interventions    09/14/2023   10:22 AM  Readmission Risk Prevention Plan  Transportation Screening Complete  PCP or Specialist Appt within 5-7 Days Complete  Home Care Screening Complete  Medication Review (RN CM) Complete

## 2024-04-02 DIAGNOSIS — N179 Acute kidney failure, unspecified: Secondary | ICD-10-CM | POA: Diagnosis not present

## 2024-04-02 MED ORDER — GABAPENTIN 300 MG PO CAPS
300.0000 mg | ORAL_CAPSULE | Freq: Every day | ORAL | Status: DC
  Administered 2024-04-02 – 2024-04-03 (×2): 300 mg via ORAL
  Filled 2024-04-02 (×2): qty 1

## 2024-04-02 NOTE — Progress Notes (Signed)
 Mobility Specialist - Progress Note   04/02/24 1227  Mobility  Activity Ambulated with assistance to bathroom;Ambulated with assistance in hallway  Level of Assistance Minimal assist, patient does 75% or more  Assistive Device Front wheel walker  Distance Ambulated (ft) 80 ft  Range of Motion/Exercises Active  Activity Response Tolerated well  Mobility Referral Yes  Mobility visit 1 Mobility  Mobility Specialist Start Time (ACUTE ONLY) 1205  Mobility Specialist Stop Time (ACUTE ONLY) 1227  Mobility Specialist Time Calculation (min) (ACUTE ONLY) 22 min   Pt was found in bed and agreeable to ambulate. No complaints with session. At EOS returned to bed with all needs met. Call bell in reach and RN notified.  Erminio Leos,  Mobility Specialist Can be reached via Secure Chat

## 2024-04-02 NOTE — TOC Progression Note (Signed)
 Transition of Care [facility]) - Progression Note    Patient Details  Name: Diane Skinner MRN: [id number] Date of Birth: 17-Aug-1923  [facility]) CM/SW Contact  Sonda Manuella Quill, RN Phone Number: 04/02/2024, 4:53 PM  Clinical Narrative:    Ins auth still pending.   Expected Discharge Plan: Skilled Nursing Facility Barriers to Discharge: Continued Medical Work up, SNF Pending bed offer, Insurance Authorization  Expected Discharge Plan and Services       Living arrangements for the past 2 months: Independent Living Facility                                       Social Determinants of Health (SDOH) Interventions SDOH Screenings   Food Insecurity: No Food Insecurity (03/30/2024)  Housing: Low Risk  (03/30/2024)  Transportation Needs: No Transportation Needs (03/30/2024)  Utilities: Not At Risk (03/30/2024)  Social Connections: Moderately Isolated (03/30/2024)  Tobacco Use: Low Risk  (03/31/2024)    Readmission Risk Interventions    09/14/2023   10:22 AM  Readmission Risk Prevention Plan  Transportation Screening Complete  PCP or Specialist Appt within 5-7 Days Complete  Home Care Screening Complete  Medication Review (RN CM) Complete

## 2024-04-02 NOTE — Progress Notes (Addendum)
 PROGRESS NOTE  Diane Skinner  DOB: [birth year]  PCP: Benjamine Aland, MD FMW:[id number]  DOA: 03/29/2024  LOS: 3 days  Hospital Day: 5  Brief narrative: Diane Skinner is a 89 y.o. female with PMH significant for dementia, hypertension, arthritis, peripheral neuropathy Patient lives in an assisted living facility  7/8, patient was brought to the ED by EMS with complaint of sore throat for a week, and multiple episode of loose stools and progressive fatigue.  In the ED, patient was afebrile, her temperature has been actually on the lower side to 97 and 98. Blood pressure 90s and low 100s Initial labs with WC count 6.5, hemoglobin 11.5, lactic acid 0.8, BUN/creatinine 63/4.06 Respiratory virus panel unremarkable Group A streptococcal PCR negative Urinalysis showed hazy yellow urine with small leukocytes, few bacteria Urine culture sent CT abdomen pelvis showed infiltration or atelectasis in the lung bases. Possible wall thickening the polar region of the stomach suggesting gastritis or possibly peptic ulcer disease.  Patient was started on IV fluid Admitted to [facility]  Subjective: Patient was seen and examined this morning.. Lying down in bed.  Alert, awake, more conversational today.  Knows she is in the hospital. Not in distress. Knows that she is in the hospital. Hemodynamically stable No labs today  Assessment and plan: AKI on CKD 4 Acute metabolic acidosis Baseline creatinine 2 from May 2025.  Presented with creatinine elevated to 4.06 secondary to dehydration from GI loss.  Creatinine is improving with IV fluid. With sodium bicarb drip, serum bicarb level is also improving.  IV fluids stopped yesterday.  Obtain labs tomorrow Recent Labs    09/11/23 1000 09/12/23 0553 09/13/23 0553 11/22/23 1835 02/02/24 2237 03/29/24 2035 03/30/24 0626 03/31/24 0414 04/01/24 0356  BUN 48* 47* 38* 43* 29* 63* 59* 49* 42*  CREATININE 2.69* 2.32* 2.09* 2.10* 2.02* 4.06* 2.94* 2.82*  2.30*  CO2 19* 19* 22 21* 24 16* 12* 19* 20*   Hyperphosphatemia Phosphorus elevated 5.1 likely in the setting of CKD.   Repeat phosphorus level this morning shows normalization Recent Labs  Lab 03/29/24 2035 03/30/24 0626 03/30/24 1242 03/31/24 0414 04/01/24 0356  K 4.3 3.8  --  4.0 3.6  MG  --  1.9 2.0  --   --   PHOS  --  5.1* 5.1*  --  3.3   Viral gastroenteritis Presented with diarrhea for a week CT scan without any specific etiology, suggested gastritis which is less likely be the cause of her symptoms. Likely viral. WBC count normal. GI pathogen panel and C. difficile was ordered but diarrhea improved and hence sample was not sent. Continue to monitor Recent Labs  Lab 03/29/24 2035 03/30/24 0101 03/30/24 0716 03/31/24 0414 04/01/24 0356  WBC 6.5  --  7.2 7.5 6.4  LATICACIDVEN  --  0.8  --   --   --    Acute metabolic encephalopathy Underlying dementia Patient has known dementia and unable to have meaningful conversation. Patient was agitated, restless on the night of 7/10..  Probably sundowning in the setting of dementia.  She required Haldol , trazodone .  She was started on scheduled trazodone .  Received it last night.  Uneventful last night Continue to monitor mental status  Sore throat Respiratory virus panel and strep throat negative Continue Cepacol throat lozenges Improved symptoms  Hypertension PTA meds- amlodipine  10 mg daily, torsemide 5 mg 3 times a week, valsartan 160 mg daily, HCTZ 25 mg daily Currently on amlodipine  and ARB.  HCTZ and torsemide on  hold. Continue to monitor blood pressure  Mild chronic anemia Hemoglobin stable Continue vitamin B12 Recent Labs    02/02/24 2237 03/29/24 2035 03/30/24 0626 03/30/24 0716 03/30/24 1242 03/31/24 0414 04/01/24 0356  HGB 11.8* 11.5*  --  11.1*  --  11.6* 10.7*  MCV 84.9 82.0  --  82.5  --  79.7* 80.8  VITAMINB12  --   --  460  --   --   --   --   FOLATE  --   --  24.5  --   --   --   --    FERRITIN  --   --   --   --  37  --   --   TIBC  --   --   --   --  223*  --   --   IRON  --   --   --   --  77  --   --   RETICCTPCT  --   --   --   --  1.2  --   --    Peripheral neuropathy Was on Neurontin  600 mg nightly.  Currently on hold due to AKI   Impaired mobility  PT eval obtained.  SNF recommended  Goals of care   Code Status: Full Code     DVT prophylaxis:  SCDs Start: 03/29/24 2329   Antimicrobials: None Fluid: Not on IV fluid for the last 24 hours.  Encourage oral hydration Consultants: None Family Communication: None at bedside  Status: Inpatient Level of care:  Progressive   Patient is from: ALF Needs to continue in-hospital care: Stabilized for discharge Anticipated d/c to: SNF placement recommended by PT.  Insurance authorization in process   Diet:  Diet Order             DIET DYS 3 Room service appropriate? Yes; Fluid consistency: Thin  Diet effective now                   Scheduled Meds:  amLODipine   10 mg Oral Daily   feeding supplement  237 mL Oral BID BM   gabapentin   300 mg Oral QHS   irbesartan   150 mg Oral Daily   traZODone   50 mg Oral QHS     PRN meds: acetaminophen  **OR** acetaminophen , albuterol , HYDROcodone -acetaminophen , menthol -cetylpyridinium, ondansetron  **OR** ondansetron  (ZOFRAN ) IV   Infusions:      Antimicrobials: Anti-infectives (From admission, onward)    None       Objective: Vitals:   04/02/24 0618 04/02/24 1311  BP: 132/70 (!) 147/79  Pulse: 78 75  Resp: 18 16  Temp: 98.2 F (36.8 C) 97.9 F (36.6 C)  SpO2: 99% 99%    Intake/Output Summary (Last 24 hours) at 04/02/2024 1425 Last data filed at 04/02/2024 1200 Gross per 24 hour  Intake 720 ml  Output 500 ml  Net 220 ml   Filed Weights   03/29/24 1809 03/30/24 1011  Weight: 55 kg 54.2 kg   Weight change:  Body mass index is 19.88 kg/m.   Physical Exam: General exam: Pleasant, elderly African-American female.  Not in pain Skin:  No rashes, lesions or ulcers. HEENT: Atraumatic, normocephalic, no obvious bleeding Lungs: Clear to auscultation bilaterally,  CVS: S1, S2, no murmur,   GI/Abd: Soft, nontender, nondistended, bowel sound present,   CNS: Alert, awake, oriented to place, not restless or agitated Psychiatry: mood appropriate Extremities: No pedal edema, no calf tenderness,   Data Review: I have personally  reviewed the laboratory data and studies available.  F/u labs  Unresulted Labs (From admission, onward)     Start     Ordered   04/03/24 0500  CBC with Differential/Platelet  Tomorrow morning,   R       Question:  Specimen collection method  Answer:  Lab=Lab collect   04/02/24 1417   04/03/24 0500  Basic metabolic panel with GFR  Tomorrow morning,   R       Question:  Specimen collection method  Answer:  Lab=Lab collect   04/02/24 1417           Signed, Chapman Rota, MD [facility] 04/02/2024

## 2024-04-03 DIAGNOSIS — N179 Acute kidney failure, unspecified: Secondary | ICD-10-CM | POA: Diagnosis not present

## 2024-04-03 LAB — CBC WITH DIFFERENTIAL/PLATELET
Abs Immature Granulocytes: 0.09 K/uL — ABNORMAL HIGH (ref 0.00–0.07)
Basophils Absolute: 0.1 K/uL (ref 0.0–0.1)
Basophils Relative: 1 %
Eosinophils Absolute: 0 K/uL (ref 0.0–0.5)
Eosinophils Relative: 0 %
HCT: 34.4 % — ABNORMAL LOW (ref 36.0–46.0)
Hemoglobin: 10.7 g/dL — ABNORMAL LOW (ref 12.0–15.0)
Immature Granulocytes: 1 %
Lymphocytes Relative: 47 %
Lymphs Abs: 3.7 K/uL (ref 0.7–4.0)
MCH: 25.8 pg — ABNORMAL LOW (ref 26.0–34.0)
MCHC: 31.1 g/dL (ref 30.0–36.0)
MCV: 83.1 fL (ref 80.0–100.0)
Monocytes Absolute: 0.6 K/uL (ref 0.1–1.0)
Monocytes Relative: 7 %
Neutro Abs: 3.4 K/uL (ref 1.7–7.7)
Neutrophils Relative %: 44 %
Platelets: 160 K/uL (ref 150–400)
RBC: 4.14 MIL/uL (ref 3.87–5.11)
RDW: 14.2 % (ref 11.5–15.5)
WBC: 7.8 K/uL (ref 4.0–10.5)
nRBC: 0 % (ref 0.0–0.2)

## 2024-04-03 LAB — BASIC METABOLIC PANEL WITH GFR
Anion gap: 5 (ref 5–15)
BUN: 31 mg/dL — ABNORMAL HIGH (ref 8–23)
CO2: 28 mmol/L (ref 22–32)
Calcium: 8.6 mg/dL — ABNORMAL LOW (ref 8.9–10.3)
Chloride: 107 mmol/L (ref 98–111)
Creatinine, Ser: 2.14 mg/dL — ABNORMAL HIGH (ref 0.44–1.00)
GFR, Estimated: 20 mL/min — ABNORMAL LOW (ref >60)
Glucose, Bld: 99 mg/dL (ref 70–99)
Potassium: 3.9 mmol/L (ref 3.5–5.1)
Sodium: 140 mmol/L (ref 135–145)

## 2024-04-03 NOTE — Progress Notes (Signed)
 Mobility Specialist - Progress Note   04/03/24 1121  Mobility  Activity Ambulated with assistance in hallway  Level of Assistance Contact guard assist, steadying assist  Assistive Device Front wheel walker  Distance Ambulated (ft) 160 ft  Range of Motion/Exercises Active  Activity Response Tolerated well  Mobility Referral Yes  Mobility visit 1 Mobility  Mobility Specialist Start Time (ACUTE ONLY) 1100  Mobility Specialist Stop Time (ACUTE ONLY) 1120  Mobility Specialist Time Calculation (min) (ACUTE ONLY) 20 min   Pt was found on recliner chair and agreeable to ambulate. No complaints with session. At EOS returned to bed with all needs met. Call bell in reach.  Erminio Leos,  Mobility Specialist Can be reached via Secure Chat

## 2024-04-03 NOTE — Consult Note (Signed)
.  WOC Nurse Consult Note: Reason for Consult: DTPI Wound type:  darkening of left buttock  Pressure Injury POA:No Measurement: see nursing flow sheet Wound bed: dark tissue, not purple  Drainage (amount, consistency, odor) none Periwound:intact  Dressing procedure/placement/frequency: Foam dressing, change every 3 days and PRN. Assess under each shift.  Turn and reposition per hospital policy  Re consult if needed, will not follow at this time. Thanks  Akaylah Lalley [employer], RN,CWOCN, CNS, CWON-AP [phone])

## 2024-04-03 NOTE — Progress Notes (Signed)
 PROGRESS NOTE  Diane Skinner  DOB: [birth year]  PCP: Benjamine Aland, MD FMW:[id number]  DOA: 03/29/2024  LOS: 4 days  Hospital Day: 6  Brief narrative: Diane Skinner is a 89 y.o. female with PMH significant for dementia, hypertension, arthritis, peripheral neuropathy Patient lives in an assisted living facility  7/8, patient was brought to the ED by EMS with complaint of sore throat for a week, and multiple episode of loose stools and progressive fatigue.  In the ED, patient was afebrile, her temperature has been actually on the lower side to 97 and 98. Blood pressure 90s and low 100s Initial labs with WC count 6.5, hemoglobin 11.5, lactic acid 0.8, BUN/creatinine 63/4.06 Respiratory virus panel unremarkable Group A streptococcal PCR negative Urinalysis showed hazy yellow urine with small leukocytes, few bacteria Urine culture sent CT abdomen pelvis showed infiltration or atelectasis in the lung bases. Possible wall thickening the polar region of the stomach suggesting gastritis or possibly peptic ulcer disease.  Patient was started on IV fluid Admitted to [facility]  Subjective: Patient was seen and examined this morning. Sitting up in recliner.  Cheerful.  Able to have some conversation.  Family not at bedside. Pending SNF  Assessment and plan: AKI on CKD 4 Acute metabolic acidosis Baseline creatinine 2 from May 2025.  Presented with creatinine elevated to 4.06 secondary to dehydration from GI loss.  Also had low serum bicarb level. Creatinine and bicarb level both improved with IV sodium bicarb drip.  Creatinine at 2.14 today close to normal. Recent Labs    09/11/23 1000 09/12/23 0553 09/13/23 0553 11/22/23 1835 02/02/24 2237 03/29/24 2035 03/30/24 0626 03/31/24 0414 04/01/24 0356 04/03/24 0453  BUN 48* 47* 38* 43* 29* 63* 59* 49* 42* 31*  CREATININE 2.69* 2.32* 2.09* 2.10* 2.02* 4.06* 2.94* 2.82* 2.30* 2.14*  CO2 19* 19* 22 21* 24 16* 12* 19* 20* 28    Hyperphosphatemia Phosphorus elevated 5.1 likely in the setting of CKD.   Repeat phosphorus level this morning shows normalization Recent Labs  Lab 03/29/24 2035 03/30/24 0626 03/30/24 1242 03/31/24 0414 04/01/24 0356 04/03/24 0453  K 4.3 3.8  --  4.0 3.6 3.9  MG  --  1.9 2.0  --   --   --   PHOS  --  5.1* 5.1*  --  3.3  --    Viral gastroenteritis Presented with diarrhea for a week CT scan without any specific etiology, suggested gastritis which is less likely be the cause of her symptoms. Likely viral. WBC count normal. GI pathogen panel and C. difficile was ordered but diarrhea improved and hence sample was not sent. Continue to monitor Recent Labs  Lab 03/29/24 2035 03/30/24 0101 03/30/24 0716 03/31/24 0414 04/01/24 0356 04/03/24 0453  WBC 6.5  --  7.2 7.5 6.4 7.8  LATICACIDVEN  --  0.8  --   --   --   --    Acute metabolic encephalopathy Underlying dementia Patient has known dementia and unable to have meaningful conversation. Patient was agitated, restless on the night of 7/10..  Probably sundowning in the setting of dementia.  She required Haldol , trazodone .  She was started on scheduled trazodone .  Received it last night.  Uneventful nights after that. Continue to monitor mental status  Sore throat Respiratory virus panel and strep throat negative Continue Cepacol throat lozenges Improved symptoms  Hypertension PTA meds- amlodipine  10 mg daily, torsemide 5 mg 3 times a week, valsartan 160 mg daily, HCTZ 25 mg daily Currently on  amlodipine  and ARB.  HCTZ and torsemide on hold. Continue to monitor blood pressure  Mild chronic anemia Hemoglobin stable Continue vitamin B12 Recent Labs    03/29/24 2035 03/30/24 0626 03/30/24 0716 03/30/24 1242 03/31/24 0414 04/01/24 0356 04/03/24 0453  HGB 11.5*  --  11.1*  --  11.6* 10.7* 10.7*  MCV 82.0  --  82.5  --  79.7* 80.8 83.1  VITAMINB12  --  460  --   --   --   --   --   FOLATE  --  24.5  --   --   --    --   --   FERRITIN  --   --   --  37  --   --   --   TIBC  --   --   --  223*  --   --   --   IRON  --   --   --  77  --   --   --   RETICCTPCT  --   --   --  1.2  --   --   --    Peripheral neuropathy Was on Neurontin  600 mg nightly.  Currently on hold due to AKI   Impaired mobility  PT eval obtained.  SNF recommended  Goals of care   Code Status: Full Code     DVT prophylaxis:  SCDs Start: 03/29/24 2329   Antimicrobials: None Fluid: Not on IV fluid for the last 24 hours.  Encourage oral hydration Consultants: None Family Communication: None at bedside  Status: Inpatient Level of care:  Progressive   Patient is from: ALF Needs to continue in-hospital care: Stabilized for discharge Anticipated d/c to: SNF placement recommended by PT.  Insurance authorization in process   Diet:  Diet Order             DIET DYS 3 Room service appropriate? Yes; Fluid consistency: Thin  Diet effective now                   Scheduled Meds:  amLODipine   10 mg Oral Daily   feeding supplement  237 mL Oral BID BM   gabapentin   300 mg Oral QHS   irbesartan   150 mg Oral Daily   traZODone   50 mg Oral QHS     PRN meds: acetaminophen  **OR** acetaminophen , albuterol , HYDROcodone -acetaminophen , menthol -cetylpyridinium, ondansetron  **OR** ondansetron  (ZOFRAN ) IV   Infusions:      Antimicrobials: Anti-infectives (From admission, onward)    None       Objective: Vitals:   04/03/24 1124 04/03/24 1327  BP: 132/68 (!) 159/74  Pulse:  60  Resp:  16  Temp:  98.3 F (36.8 C)  SpO2: 96% 100%    Intake/Output Summary (Last 24 hours) at 04/03/2024 1406 Last data filed at 04/03/2024 1012 Gross per 24 hour  Intake 240 ml  Output 600 ml  Net -360 ml   Filed Weights   03/29/24 1809 03/30/24 1011  Weight: 55 kg 54.2 kg   Weight change:  Body mass index is 19.88 kg/m.   Physical Exam: General exam: Pleasant, elderly African-American female.  Not in pain Skin: No rashes,  lesions or ulcers. HEENT: Atraumatic, normocephalic, no obvious bleeding Lungs: Clear to auscultation bilaterally,  CVS: S1, S2, no murmur,   GI/Abd: Soft, nontender, nondistended, bowel sound present,   CNS: Alert, awake, oriented to place, not restless or agitated Psychiatry: mood appropriate Extremities: No pedal edema, no calf tenderness  Data  Review: I have personally reviewed the laboratory data and studies available.  F/u labs  Unresulted Labs (From admission, onward)    None      Signed, Chapman Rota, MD [facility] 04/03/2024

## 2024-04-04 DIAGNOSIS — N179 Acute kidney failure, unspecified: Secondary | ICD-10-CM | POA: Diagnosis not present

## 2024-04-04 MED ORDER — ALBUTEROL SULFATE (2.5 MG/3ML) 0.083% IN NEBU
2.5000 mg | INHALATION_SOLUTION | Freq: Four times a day (QID) | RESPIRATORY_TRACT | 12 refills | Status: AC | PRN
Start: 2024-04-04 — End: ?

## 2024-04-04 MED ORDER — ACETAMINOPHEN 325 MG PO TABS: 650.0000 mg | ORAL_TABLET | Freq: Four times a day (QID) | ORAL | Status: AC | PRN

## 2024-04-04 MED ORDER — ENSURE PLUS HIGH PROTEIN PO LIQD
237.0000 mL | Freq: Two times a day (BID) | ORAL | Status: AC
Start: 2024-04-04 — End: ?

## 2024-04-04 NOTE — Progress Notes (Signed)
Report called to [facility] 

## 2024-04-04 NOTE — TOC Transition Note (Signed)
 Transition of Care [facility]) - Discharge Note   Patient Details  Name: Diane Skinner MRN: [id number] Date of Birth: [birth year]  [facility]) CM/SW Contact:  Tawni CHRISTELLA Eva, LCSW Phone Number: 04/04/2024, 11:00 AM   Clinical Narrative:     Pt's insurance auth was approved, Shara [id number] 04/02/24-04/05/24. Pt to d/c to [facility] place room 803, RN to call report to [phone]. CSW spoke with pt's Niece layton ,  agrees with d/c plan. PTAR called. TOC sign off.    Final next level of care: Skilled Nursing Facility Barriers to Discharge: Barriers Resolved   Patient Goals and CMS Choice Patient states their goals for this hospitalization and ongoing recovery are:: return home CMS Medicare.gov Compare Post Acute Care list provided to:: Patient Represenative (must comment) Choice offered to / list presented to : Patient, [facility] POA / Guardian      Discharge Placement              Patient chooses bed at: [facility] Patient to be transferred to facility by: EMS Name of family member notified: Gildardo Boast (Niece)  [phone] (Mobile) Patient and family notified of of transfer: 04/04/24  Discharge Plan and Services Additional resources added to the After Visit Summary for                                       Social Drivers of Health (SDOH) Interventions SDOH Screenings   Food Insecurity: No Food Insecurity (03/30/2024)  Housing: Low Risk  (03/30/2024)  Transportation Needs: No Transportation Needs (03/30/2024)  Utilities: Not At Risk (03/30/2024)  Social Connections: Moderately Isolated (03/30/2024)  Tobacco Use: Low Risk  (03/31/2024)     Readmission Risk Interventions    09/14/2023   10:22 AM  Readmission Risk Prevention Plan  Transportation Screening Complete  PCP or Specialist Appt within 5-7 Days Complete  Home Care Screening Complete  Medication Review (RN CM) Complete

## 2024-04-04 NOTE — Discharge Summary (Addendum)
 Physician Discharge Summary  Diane Skinner FMW:[id number] DOB: August 04, 1923 DOA: 03/29/2024  PCP: Benjamine Aland, MD  Admit date: 03/29/2024 Discharge date: 04/04/2024  Admitted From: [facility] Discharge disposition: SNF  Recommendations at discharge:  Resume meds as before   Brief narrative: Diane Skinner is a 89 y.o. female with PMH significant for dementia, hypertension, arthritis, peripheral neuropathy Patient lives in an assisted living facility  7/8, patient was brought to the ED by EMS with complaint of sore throat for a week, and multiple episode of loose stools and progressive fatigue.  In the ED, patient was afebrile, her temperature has been actually on the lower side to 97 and 98. Blood pressure 90s and low 100s Initial labs with WC count 6.5, hemoglobin 11.5, lactic acid 0.8, BUN/creatinine 63/4.06 Respiratory virus panel unremarkable Group A streptococcal PCR negative Urinalysis showed hazy yellow urine with small leukocytes, few bacteria Urine culture sent CT abdomen pelvis showed infiltration or atelectasis in the lung bases. Possible wall thickening the polar region of the stomach suggesting gastritis or possibly peptic ulcer disease.  Patient was started on IV fluid Admitted to [facility]  Subjective: Patient was seen and examined this morning. Sitting up in recliner.  Cheerful.  Able to have some conversation.  Family not at bedside. I called and spoke to patient's niece India.  Hospital course: AKI on CKD 4 Acute metabolic acidosis Baseline creatinine 2 from May 2025.  Presented with creatinine elevated to 4.06 secondary to dehydration from GI loss.  Also had low serum bicarb level. Creatinine and bicarb level both improved with IV sodium bicarb drip.  Creatinine at 2.14 on last blood work on 7/13, close to normal. Recent Labs    09/11/23 1000 09/12/23 0553 09/13/23 0553 11/22/23 1835 02/02/24 2237 03/29/24 2035 03/30/24 0626 03/31/24 0414 04/01/24 0356  04/03/24 0453  BUN 48* 47* 38* 43* 29* 63* 59* 49* 42* 31*  CREATININE 2.69* 2.32* 2.09* 2.10* 2.02* 4.06* 2.94* 2.82* 2.30* 2.14*  CO2 19* 19* 22 21* 24 16* 12* 19* 20* 28   Hyperphosphatemia Phosphorus elevated 5.1 likely in the setting of CKD.   Repeat phosphorus level this morning shows normalization Recent Labs  Lab 03/29/24 2035 03/30/24 0626 03/30/24 1242 03/31/24 0414 04/01/24 0356 04/03/24 0453  K 4.3 3.8  --  4.0 3.6 3.9  MG  --  1.9 2.0  --   --   --   PHOS  --  5.1* 5.1*  --  3.3  --    Viral gastroenteritis Presented with diarrhea for a week CT scan without any specific etiology, suggested gastritis which is less likely be the cause of her symptoms. Likely viral. WBC count normal. GI pathogen panel and C. difficile was ordered but diarrhea improved and hence sample was not sent. Recent Labs  Lab 03/29/24 2035 03/30/24 0101 03/30/24 0716 03/31/24 0414 04/01/24 0356 04/03/24 0453  WBC 6.5  --  7.2 7.5 6.4 7.8  LATICACIDVEN  --  0.8  --   --   --   --    Acute metabolic encephalopathy Underlying dementia Patient has known dementia and unable to have meaningful conversation. Patient was agitated, restless on the night of 7/10..  Probably sundowning in the setting of dementia.  She required Haldol , trazodone .  She was started on scheduled trazodone .  Received it last night.  Uneventful nights after that. Continue to monitor mental status  Sore throat Respiratory virus panel and strep throat negative Continue Cepacol throat lozenges Improved symptoms  Hypertension  PTA meds- amlodipine  10 mg daily,  valsartan 160 mg daily, HCTZ 25 mg daily, torsemide 5 mg 3 times a week, Currently on amlodipine  and ARB.  HCTZ and torsemide were kept on hold. No longer losing fluid. Can resume at discharge.  Mild chronic anemia Hemoglobin stable Continue vitamin B12 Recent Labs    03/29/24 2035 03/30/24 0626 03/30/24 0716 03/30/24 1242 03/31/24 0414 04/01/24 0356  04/03/24 0453  HGB 11.5*  --  11.1*  --  11.6* 10.7* 10.7*  MCV 82.0  --  82.5  --  79.7* 80.8 83.1  VITAMINB12  --  460  --   --   --   --   --   FOLATE  --  24.5  --   --   --   --   --   FERRITIN  --   --   --  37  --   --   --   TIBC  --   --   --  223*  --   --   --   IRON  --   --   --  77  --   --   --   RETICCTPCT  --   --   --  1.2  --   --   --    Peripheral neuropathy Was on Neurontin  600 mg nightly.  Resume the same.   Impaired mobility  PT eval obtained.  SNF recommended  Goals of care   Code Status: Full Code   Diet:  Diet Order             Diet general           DIET DYS 3 Room service appropriate? Yes; Fluid consistency: Thin  Diet effective now                   Nutritional status:  Body mass index is 19.88 kg/m.  Nutrition Problem: Increased nutrient needs Etiology: acute illness Signs/Symptoms: estimated needs  Wounds:  - Wound 04/03/24 1647 Pressure Injury Buttocks Left Deep Tissue Pressure Injury - Purple or maroon localized area of discolored intact skin or blood-filled blister due to damage of underlying soft tissue from pressure and/or shear. (Active)  Date First Assessed/Time First Assessed: 04/03/24 1647   Primary Wound Type: Pressure Injury  Location: Buttocks  Location Orientation: Left  Staging: Deep Tissue Pressure Injury - Purple or maroon localized area of discolored intact skin or blood-fil...    Assessments 04/03/2024  5:00 PM 04/04/2024 11:14 AM  Site / Wound Assessment Painful Purple  Peri-wound Assessment Intact Intact  Wound Length (cm) 2 cm --  Wound Width (cm) 1 cm --  Wound Surface Area (cm^2) 1.57 cm^2 --  Dressing Type Impregnated gauze (bismuth) Foam - Lift dressing to assess site every shift  Dressing Changed New --  % Wound base Other/Granulation Tissue (Comment) 100% --     No associated orders.    Discharge Exam:   Vitals:   04/03/24 1124 04/03/24 1327 04/03/24 1938 04/04/24 0453  BP: 132/68 (!) 159/74 (!)  146/75 134/75  Pulse:  60 70 63  Resp:  16 16 16   Temp:  98.3 F (36.8 C) 97.9 F (36.6 C) 97.6 F (36.4 C)  TempSrc:  Oral Oral Oral  SpO2: 96% 100% 97% 97%  Weight:      Height:        Body mass index is 19.88 kg/m.   General exam: Pleasant, elderly African-American female.  Not  in pain Skin: No rashes, lesions or ulcers. HEENT: Atraumatic, normocephalic, no obvious bleeding Lungs: Clear to auscultation bilaterally,  CVS: S1, S2, no murmur,   GI/Abd: Soft, nontender, nondistended, bowel sound present,   CNS: Alert, awake, oriented to place, not restless or agitated Psychiatry: mood appropriate Extremities: No pedal edema, no calf tenderness  Follow ups:    Contact information for follow-up providers     Benjamine Aland, MD Follow up.   Specialty: Family Medicine Contact information: [address] [place], [address] [place] [address] [phone]              Contact information for after-discharge care     Destination     [facility] .   Service: Skilled Nursing Contact information: [address] [place]  [phone]                     Discharge Instructions:   Discharge Instructions     Call MD for:  difficulty breathing, headache or visual disturbances   Complete by: As directed    Call MD for:  extreme fatigue   Complete by: As directed    Call MD for:  hives   Complete by: As directed    Call MD for:  persistant dizziness or light-headedness   Complete by: As directed    Call MD for:  persistant nausea and vomiting   Complete by: As directed    Call MD for:  severe uncontrolled pain   Complete by: As directed    Call MD for:  temperature >100.4   Complete by: As directed    Diet general   Complete by: As directed    Discharge instructions   Complete by: As directed    Recommendations at discharge:   Resume meds as before  General discharge instructions: Follow with Primary  MD Benjamine Aland, MD in 7 days  Please request your PCP  to go over your hospital tests, procedures, radiology results at the follow up. Please get your medicines reviewed and adjusted.  Your PCP may decide to repeat certain labs or tests as needed. Do not drive, operate heavy machinery, perform activities at heights, swimming or participation in water activities or provide baby sitting services if your were admitted for syncope or siezures until you have seen by Primary MD or a Neurologist and advised to do so again. [place]  Controlled Substance Reporting System database was reviewed. Do not drive, operate heavy machinery, perform activities at heights, swim, participate in water activities or provide baby-sitting services while on medications for pain, sleep and mood until your outpatient physician has reevaluated you and advised to do so again.  You are strongly recommended to comply with the dose, frequency and duration of prescribed medications. Activity: As tolerated with Full fall precautions use walker/cane & assistance as needed Avoid using any recreational substances like cigarette, tobacco, alcohol, or non-prescribed drug. If you experience worsening of your admission symptoms, develop shortness of breath, life threatening emergency, suicidal or homicidal thoughts you must seek medical attention immediately by calling 911 or calling your MD immediately  if symptoms less severe. You must read complete instructions/literature along with all the possible adverse reactions/side effects for all the medicines you take and that have been prescribed to you. Take any new medicine only after you have completely understood and accepted all the possible adverse reactions/side effects.  Wear Seat belts while driving. You were cared for by a hospitalist during  your hospital stay. If you have any questions about your discharge medications or the care you received while you were in the hospital after you  are discharged, you can call the unit and ask to speak with the hospitalist or the covering physician. Once you are discharged, your primary care physician will handle any further medical issues. Please note that NO REFILLS for any discharge medications will be authorized once you are discharged, as it is imperative that you return to your primary care physician (or establish a relationship with a primary care physician if you do not have one).   Discharge wound care:   Complete by: As directed    Increase activity slowly   Complete by: As directed        Discharge Medications:   Allergies as of 04/04/2024       Reactions   Celexa  [citalopram ] Other (See Comments)   Tremors of hands/legs   Ibuprofen Swelling   Valsartan-hydrochlorothiazide Other (See Comments)   Hyperkalemia ---- Patient is still taking this medication at home as of 03/30/2024. Needs to be reviewed.         Medication List     STOP taking these medications    citalopram  20 MG tablet Commonly known as: CELEXA    dicyclomine  20 MG tablet Commonly known as: BENTYL        TAKE these medications    acetaminophen  325 MG tablet Commonly known as: TYLENOL  Take 2 tablets (650 mg total) by mouth every 6 (six) hours as needed for mild pain (pain score 1-3) (or Fever >/= 101).   albuterol  (2.5 MG/3ML) 0.083% nebulizer solution Commonly known as: PROVENTIL  Take 3 mLs (2.5 mg total) by nebulization every 6 (six) hours as needed for wheezing or shortness of breath.   amLODipine  10 MG tablet Commonly known as: NORVASC  TAKE 1 TABLET BY MOUTH EVERY DAY   feeding supplement Liqd Take 237 mLs by mouth 2 (two) times daily between meals.   gabapentin  300 MG capsule Commonly known as: NEURONTIN  Take 1 capsule (300 mg total) by mouth at bedtime. What changed: how much to take   torsemide 5 MG tablet Commonly known as: DEMADEX Take 5 mg by mouth 3 (three) times a week.   traZODone  50 MG tablet Commonly known as:  DESYREL  Take 1 tablet (50 mg total) by mouth at bedtime.   valsartan-hydrochlorothiazide 160-25 MG tablet Commonly known as: DIOVAN-HCT Take 1 tablet by mouth daily.   VITAMIN B12 PO Take 1 capsule by mouth at bedtime.               Discharge Care Instructions  (From admission, onward)           Start     Ordered   04/04/24 0000  Discharge wound care:        04/04/24 1048             The results of significant diagnostics from this hospitalization (including imaging, microbiology, ancillary and laboratory) are listed below for reference.    Procedures and Diagnostic Studies:   CT ABDOMEN PELVIS WO CONTRAST Result Date: 03/30/2024 CLINICAL DATA:  Acute nonlocalized abdominal pain.  Diarrhea. EXAM: CT ABDOMEN AND PELVIS WITHOUT CONTRAST TECHNIQUE: Multidetector CT imaging of the abdomen and pelvis was performed following the standard protocol without IV contrast. RADIATION DOSE REDUCTION: This exam was performed according to the departmental dose-optimization program which includes automated exposure control, adjustment of the mA and/or kV according to patient size and/or use of iterative reconstruction technique.  COMPARISON:  02/03/2024 FINDINGS: Lower chest: Patchy infiltrates in the lung bases, possibly atelectasis or pneumonia. Hepatobiliary: No focal liver abnormality is seen. Status post cholecystectomy. No biliary dilatation. Pancreas: Unremarkable. No pancreatic ductal dilatation or surrounding inflammatory changes. Spleen: Normal in size without focal abnormality. Adrenals/Urinary Tract: Adrenal glands are unremarkable. Kidneys are normal, without renal calculi, focal lesion, or hydronephrosis. Bladder is unremarkable. Stomach/Bowel: Stomach, small bowel, and colon are not abnormally distended. Under distention limits evaluation but there appears to be some wall thickening the pyloric region of the stomach which may indicate gastritis or peptic ulcer disease.  Diverticulosis of the sigmoid colon without evidence of acute diverticulitis. Appendix is not identified. Vascular/Lymphatic: Aortic atherosclerosis. No enlarged abdominal or pelvic lymph nodes. Reproductive: Status post hysterectomy. No adnexal masses. Other: No abdominal wall hernia or abnormality. No abdominopelvic ascites. Musculoskeletal: Mild lumbar scoliosis convex towards the left. Degenerative changes in the spine and hips. No acute bony abnormalities. IMPRESSION: 1. Infiltration or atelectasis in the lung bases. 2. Possible wall thickening the polar region of the stomach suggesting gastritis or possibly peptic ulcer disease. 3. Aortic atherosclerosis. Electronically Signed   By: Elsie Gravely M.D.   On: 03/30/2024 02:47     Labs:   Basic Metabolic Panel: Recent Labs  Lab 03/29/24 2035 03/30/24 0626 03/30/24 1242 03/31/24 0414 04/01/24 0356 04/03/24 0453  NA 136 134*  --  141 143 140  K 4.3 3.8  --  4.0 3.6 3.9  CL 108 110  --  111 114* 107  CO2 16* 12*  --  19* 20* 28  GLUCOSE 97 84  --  75 95 99  BUN 63* 59*  --  49* 42* 31*  CREATININE 4.06* 2.94*  --  2.82* 2.30* 2.14*  CALCIUM  8.4* 8.0*  --  8.3* 8.3* 8.6*  MG  --  1.9 2.0  --   --   --   PHOS  --  5.1* 5.1*  --  3.3  --    GFR Estimated Creatinine Clearance: 12.3 mL/min (A) (by C-G formula based on SCr of 2.14 mg/dL (H)). Liver Function Tests: Recent Labs  Lab 03/29/24 2035 03/30/24 0626  AST 16 13*  ALT 8 7  ALKPHOS 62 60  BILITOT 1.1 0.8  PROT 6.9 6.1*  ALBUMIN 3.6 3.2*   No results for input(s): LIPASE, AMYLASE in the last 168 hours. No results for input(s): AMMONIA in the last 168 hours. Coagulation profile No results for input(s): INR, PROTIME in the last 168 hours.  CBC: Recent Labs  Lab 03/29/24 2035 03/30/24 0716 03/31/24 0414 04/01/24 0356 04/03/24 0453  WBC 6.5 7.2 7.5 6.4 7.8  NEUTROABS 3.6  --  4.5 4.2 3.4  HGB 11.5* 11.1* 11.6* 10.7* 10.7*  HCT 35.9* 35.0* 35.4* 32.8*  34.4*  MCV 82.0 82.5 79.7* 80.8 83.1  PLT 148* 144* 147* 154 160   Cardiac Enzymes: Recent Labs  Lab 03/30/24 1242  CKTOTAL 49   BNP: Invalid input(s): POCBNP CBG: No results for input(s): GLUCAP in the last 168 hours. D-Dimer No results for input(s): DDIMER in the last 72 hours. Hgb A1c No results for input(s): HGBA1C in the last 72 hours. Lipid Profile No results for input(s): CHOL, HDL, LDLCALC, TRIG, CHOLHDL, LDLDIRECT in the last 72 hours. Thyroid function studies No results for input(s): TSH, T4TOTAL, T3FREE, THYROIDAB in the last 72 hours.  Invalid input(s): FREET3 Anemia work up No results for input(s): VITAMINB12, FOLATE, FERRITIN, TIBC, IRON, RETICCTPCT in the last 72 hours.  Microbiology Recent Results (from the past 240 hours)  Resp panel by RT-PCR (RSV, Flu A&B, Covid) Anterior Nasal Swab     Status: None   Collection Time: 03/29/24  7:28 PM   Specimen: Anterior Nasal Swab  Result Value Ref Range Status   SARS Coronavirus 2 by RT PCR NEGATIVE NEGATIVE Final    Comment: (NOTE) SARS-CoV-2 target nucleic acids are NOT DETECTED.  The SARS-CoV-2 RNA is generally detectable in upper respiratory specimens during the acute phase of infection. The lowest concentration of SARS-CoV-2 viral copies this assay can detect is 138 copies/mL. A negative result does not preclude SARS-Cov-2 infection and should not be used as the sole basis for treatment or other patient management decisions. A negative result may occur with  improper specimen collection/handling, submission of specimen other than nasopharyngeal swab, presence of viral mutation(s) within the areas targeted by this assay, and inadequate number of viral copies(<138 copies/mL). A negative result must be combined with clinical observations, patient history, and epidemiological information. The expected result is Negative.  Fact Sheet for Patients:   BloggerCourse.com  Fact Sheet for Healthcare Providers:  SeriousBroker.it  This test is no t yet approved or cleared by the [place]  FDA and  has been authorized for detection and/or diagnosis of SARS-CoV-2 by FDA under an Emergency Use Authorization (EUA). This EUA will remain  in effect (meaning this test can be used) for the duration of the COVID-19 declaration under Section 564(b)(1) of the Act, 21 U.S.C.section 360bbb-3(b)(1), unless the authorization is terminated  or revoked sooner.       Influenza A by PCR NEGATIVE NEGATIVE Final   Influenza B by PCR NEGATIVE NEGATIVE Final    Comment: (NOTE) The Xpert Xpress SARS-CoV-2/FLU/RSV plus assay is intended as an aid in the diagnosis of influenza from Nasopharyngeal swab specimens and should not be used as a sole basis for treatment. Nasal washings and aspirates are unacceptable for Xpert Xpress SARS-CoV-2/FLU/RSV testing.  Fact Sheet for Patients: BloggerCourse.com  Fact Sheet for Healthcare Providers: SeriousBroker.it  This test is not yet approved or cleared by the [place]  FDA and has been authorized for detection and/or diagnosis of SARS-CoV-2 by FDA under an Emergency Use Authorization (EUA). This EUA will remain in effect (meaning this test can be used) for the duration of the COVID-19 declaration under Section 564(b)(1) of the Act, 21 U.S.C. section 360bbb-3(b)(1), unless the authorization is terminated or revoked.     Resp Syncytial Virus by PCR NEGATIVE NEGATIVE Final    Comment: (NOTE) Fact Sheet for Patients: BloggerCourse.com  Fact Sheet for Healthcare Providers: SeriousBroker.it  This test is not yet approved or cleared by the [place]  FDA and has been authorized for detection and/or diagnosis of SARS-CoV-2 by FDA under an Emergency Use  Authorization (EUA). This EUA will remain in effect (meaning this test can be used) for the duration of the COVID-19 declaration under Section 564(b)(1) of the Act, 21 U.S.C. section 360bbb-3(b)(1), unless the authorization is terminated or revoked.  Performed at [facility], [address] [place], [place] [address]   Group A Strep by PCR if patient complains of sore throat.     Status: None   Collection Time: 03/29/24  8:35 PM   Specimen: Throat; Sterile Swab  Result Value Ref Range Status   Group A Strep by PCR NOT DETECTED NOT DETECTED Final    Comment: Performed at [facility], [address], [place], [place] [address]  Urine Culture (for  pregnant, neutropenic or urologic patients or patients with an indwelling urinary catheter)     Status: Abnormal   Collection Time: 03/30/24  1:03 AM   Specimen: Urine, Clean Catch  Result Value Ref Range Status   Specimen Description   Final    URINE, CLEAN CATCH Performed at [facility], [address], [place], [place] [address]    Special Requests   Final    NONE Performed at [facility], [address] [place], [place] [address]    Culture (A)  Final    30,000 COLONIES/mL ESCHERICHIA COLI 80,000 COLONIES/mL LACTOBACILLUS SPECIES Standardized susceptibility testing for this organism is not available. Performed at [facility], [address], [place], [place] [address]    Report Status 04/01/2024 FINAL  Final   Organism ID, Bacteria ESCHERICHIA COLI (A)  Final      Susceptibility   Escherichia coli - MIC*    AMPICILLIN <=2 SENSITIVE Sensitive     CEFAZOLIN <=4 SENSITIVE Sensitive     CEFEPIME <=0.12 SENSITIVE Sensitive     CEFTRIAXONE  <=0.25 SENSITIVE Sensitive     CIPROFLOXACIN <=0.25 SENSITIVE Sensitive     GENTAMICIN <=1 SENSITIVE Sensitive     IMIPENEM <=0.25 SENSITIVE Sensitive     NITROFURANTOIN <=16 SENSITIVE Sensitive      TRIMETH/SULFA <=20 SENSITIVE Sensitive     AMPICILLIN/SULBACTAM <=2 SENSITIVE Sensitive     PIP/TAZO <=4 SENSITIVE Sensitive ug/mL    * 30,000 COLONIES/mL ESCHERICHIA COLI  Gastrointestinal Panel by PCR , Stool     Status: None   Collection Time: 03/30/24  6:00 AM   Specimen: Stool  Result Value Ref Range Status   Campylobacter species NOT DETECTED NOT DETECTED Final   Plesimonas shigelloides NOT DETECTED NOT DETECTED Final   Salmonella species NOT DETECTED NOT DETECTED Final   Yersinia enterocolitica NOT DETECTED NOT DETECTED Final   Vibrio species NOT DETECTED NOT DETECTED Final   Vibrio cholerae NOT DETECTED NOT DETECTED Final   Enteroaggregative E coli (EAEC) NOT DETECTED NOT DETECTED Final   Enteropathogenic E coli (EPEC) NOT DETECTED NOT DETECTED Final   Enterotoxigenic E coli (ETEC) NOT DETECTED NOT DETECTED Final   Shiga like toxin producing E coli (STEC) NOT DETECTED NOT DETECTED Final   Shigella/Enteroinvasive E coli (EIEC) NOT DETECTED NOT DETECTED Final   Cryptosporidium NOT DETECTED NOT DETECTED Final   Cyclospora cayetanensis NOT DETECTED NOT DETECTED Final   Entamoeba histolytica NOT DETECTED NOT DETECTED Final   Giardia lamblia NOT DETECTED NOT DETECTED Final   Adenovirus F40/41 NOT DETECTED NOT DETECTED Final   Astrovirus NOT DETECTED NOT DETECTED Final   Norovirus GI/GII NOT DETECTED NOT DETECTED Final   Rotavirus A NOT DETECTED NOT DETECTED Final   Sapovirus (I, II, IV, and V) NOT DETECTED NOT DETECTED Final    Comment: Performed at [facility], [address], [place], [place] [address]    Time coordinating discharge: 45 minutes  Signed: Rozalia Dino  [facility] 04/04/2024, 11:33 AM

## 2024-07-13 ENCOUNTER — Emergency Department (HOSPITAL_COMMUNITY)

## 2024-07-13 ENCOUNTER — Encounter (HOSPITAL_COMMUNITY): Payer: Self-pay

## 2024-07-13 ENCOUNTER — Other Ambulatory Visit: Payer: Self-pay

## 2024-07-13 ENCOUNTER — Emergency Department (HOSPITAL_COMMUNITY)
Admission: EM | Admit: 2024-07-13 | Discharge: 2024-07-13 | Disposition: A | Discharge status: Home / Self Care | Attending: Emergency Medicine | Admitting: Emergency Medicine

## 2024-07-13 DIAGNOSIS — I639 Cerebral infarction, unspecified: Secondary | ICD-10-CM | POA: Diagnosis present

## 2024-07-13 DIAGNOSIS — Z9889 Other specified postprocedural states: Secondary | ICD-10-CM | POA: Insufficient documentation

## 2024-07-13 DIAGNOSIS — Z79899 Other long term (current) drug therapy: Secondary | ICD-10-CM | POA: Diagnosis not present

## 2024-07-13 DIAGNOSIS — R4701 Aphasia: Secondary | ICD-10-CM | POA: Insufficient documentation

## 2024-07-13 DIAGNOSIS — R4781 Slurred speech: Secondary | ICD-10-CM

## 2024-07-13 DIAGNOSIS — E875 Hyperkalemia: Secondary | ICD-10-CM | POA: Insufficient documentation

## 2024-07-13 DIAGNOSIS — N189 Chronic kidney disease, unspecified: Secondary | ICD-10-CM | POA: Insufficient documentation

## 2024-07-13 DIAGNOSIS — R569 Unspecified convulsions: Secondary | ICD-10-CM | POA: Diagnosis not present

## 2024-07-13 DIAGNOSIS — R29810 Facial weakness: Secondary | ICD-10-CM | POA: Insufficient documentation

## 2024-07-13 DIAGNOSIS — G629 Polyneuropathy, unspecified: Secondary | ICD-10-CM | POA: Diagnosis not present

## 2024-07-13 DIAGNOSIS — I129 Hypertensive chronic kidney disease with stage 1 through stage 4 chronic kidney disease, or unspecified chronic kidney disease: Secondary | ICD-10-CM | POA: Diagnosis not present

## 2024-07-13 DIAGNOSIS — R531 Weakness: Secondary | ICD-10-CM | POA: Insufficient documentation

## 2024-07-13 LAB — CBC
HCT: 38.7 % (ref 36.0–46.0)
Hemoglobin: 12.1 g/dL (ref 12.0–15.0)
MCH: 25.6 pg — ABNORMAL LOW (ref 26.0–34.0)
MCHC: 31.3 g/dL (ref 30.0–36.0)
MCV: 81.8 fL (ref 80.0–100.0)
Platelets: 140 K/uL — ABNORMAL LOW (ref 150–400)
RBC: 4.73 MIL/uL (ref 3.87–5.11)
RDW: 14.1 % (ref 11.5–15.5)
WBC: 6.2 K/uL (ref 4.0–10.5)
nRBC: 0 % (ref 0.0–0.2)

## 2024-07-13 LAB — DIFFERENTIAL
Abs Immature Granulocytes: 0.02 K/uL (ref 0.00–0.07)
Basophils Absolute: 0 K/uL (ref 0.0–0.1)
Basophils Relative: 0 %
Eosinophils Absolute: 0 K/uL (ref 0.0–0.5)
Eosinophils Relative: 0 %
Immature Granulocytes: 0 %
Lymphocytes Relative: 20 %
Lymphs Abs: 1.2 K/uL (ref 0.7–4.0)
Monocytes Absolute: 0.3 K/uL (ref 0.1–1.0)
Monocytes Relative: 5 %
Neutro Abs: 4.6 K/uL (ref 1.7–7.7)
Neutrophils Relative %: 75 %

## 2024-07-13 LAB — I-STAT CHEM 8, ED
BUN: 52 mg/dL — ABNORMAL HIGH (ref 8–23)
Calcium, Ion: 1.12 mmol/L — ABNORMAL LOW (ref 1.15–1.40)
Chloride: 107 mmol/L (ref 98–111)
Creatinine, Ser: 2.4 mg/dL — ABNORMAL HIGH (ref 0.44–1.00)
Glucose, Bld: 98 mg/dL (ref 70–99)
HCT: 38 % (ref 36.0–46.0)
Hemoglobin: 12.9 g/dL (ref 12.0–15.0)
Potassium: 5.8 mmol/L — ABNORMAL HIGH (ref 3.5–5.1)
Sodium: 138 mmol/L (ref 135–145)
TCO2: 23 mmol/L (ref 22–32)

## 2024-07-13 LAB — COMPREHENSIVE METABOLIC PANEL WITH GFR
ALT: 7 U/L (ref 0–44)
AST: 24 U/L (ref 15–41)
Albumin: 4 g/dL (ref 3.5–5.0)
Alkaline Phosphatase: 63 U/L (ref 38–126)
Anion gap: 8 (ref 5–15)
BUN: 44 mg/dL — ABNORMAL HIGH (ref 8–23)
CO2: 19 mmol/L — ABNORMAL LOW (ref 22–32)
Calcium: 8.7 mg/dL — ABNORMAL LOW (ref 8.9–10.3)
Chloride: 107 mmol/L (ref 98–111)
Creatinine, Ser: 2.17 mg/dL — ABNORMAL HIGH (ref 0.44–1.00)
GFR, Estimated: 20 mL/min — ABNORMAL LOW (ref >60)
Glucose, Bld: 100 mg/dL — ABNORMAL HIGH (ref 70–99)
Potassium: 5.6 mmol/L — ABNORMAL HIGH (ref 3.5–5.1)
Sodium: 134 mmol/L — ABNORMAL LOW (ref 135–145)
Total Bilirubin: 2.3 mg/dL — ABNORMAL HIGH (ref 0.0–1.2)
Total Protein: 7.3 g/dL (ref 6.5–8.1)

## 2024-07-13 LAB — CBG MONITORING, ED: Glucose-Capillary: 103 mg/dL — ABNORMAL HIGH (ref 70–99)

## 2024-07-13 LAB — APTT: aPTT: 24 s (ref 24–36)

## 2024-07-13 LAB — ETHANOL: Alcohol, Ethyl (B): <15 mg/dL (ref <15)

## 2024-07-13 LAB — PROTIME-INR
INR: 1 (ref 0.8–1.2)
Prothrombin Time: 14.3 s (ref 11.4–15.2)

## 2024-07-13 MED ORDER — SODIUM ZIRCONIUM CYCLOSILICATE 10 G PO PACK
10.0000 g | PACK | Freq: Once | ORAL | Status: AC
  Administered 2024-07-13: 10 g via ORAL
  Filled 2024-07-13: qty 1

## 2024-07-13 MED ORDER — SODIUM CHLORIDE 0.9% FLUSH: 3.0000 mL | Freq: Once | INTRAVENOUS | Status: DC

## 2024-07-13 NOTE — ED Notes (Signed)
 Patient transported to MRI

## 2024-07-13 NOTE — Progress Notes (Signed)
 EEG complete - results pending

## 2024-07-13 NOTE — Procedures (Signed)
 Patient Name: Diane Skinner  MRN: [id number]  Epilepsy Attending: Arlin MALVA Krebs  Referring Physician/Provider: Khaliqdina, Salman, MD  Date: 07/13/2024 Duration: 22.07 mins  Patient history: 89 y.o. female with hx of HTN, arthritis, hard of hearing, peripheral neuropathy who was at her facility working with PT when she was noted to have acute onset R sided weakness, slurred speech, R facial droop and R gaze deviation. EEG to evaluate for seizure  Level of alertness: Awake  AEDs during EEG study: None  Technical aspects: This EEG study was done with scalp electrodes positioned according to the 10-20 International system of electrode placement. Electrical activity was reviewed with band pass filter of 1-70Hz , sensitivity of 7 uV/mm, display speed of 55mm/sec with a 60Hz  notched filter applied as appropriate. EEG data were recorded continuously and digitally stored.  Video monitoring was available and reviewed as appropriate.  Description: The posterior dominant rhythm consists of 8 Hz activity of moderate voltage (25-35 uV) seen predominantly in posterior head regions, symmetric and reactive to eye opening and eye closing. Hyperventilation and photic stimulation were not performed.     IMPRESSION: This study is within normal limits. No seizures or epileptiform discharges were seen throughout the recording.  A normal interictal EEG does not exclude the diagnosis of epilepsy.   Cele Mote O Ukiah Trawick

## 2024-07-13 NOTE — Discharge Instructions (Signed)
 It is unclear what caused your symptoms today.  Fortunately there is no evidence of stroke.  Your potassium is mildly elevated today and you were given some medicine called Lokelma  to help with this.  You also need to hold your valsartan for at least 2 days.  Call your doctor in the morning to help set up a recheck of this blood work as well as for medication adjustment.  Return to the ER for any new or worsening symptoms.

## 2024-07-13 NOTE — ED Notes (Signed)
 NT assisted pt to use bedside commode,pt pivot good to sit on commode with no complaints

## 2024-07-13 NOTE — ED Notes (Signed)
 Pt assisted off bedside commode and back into bed. Denies any pain at this time. Call bell in reach

## 2024-07-13 NOTE — ED Provider Notes (Signed)
 [place] EMERGENCY DEPARTMENT AT [facility] Provider Note   CSN: [id number] Arrival date & time: 07/13/24  1012  An emergency department physician performed an initial assessment on this suspected stroke patient at 1013.  Patient presents with: Code Stroke   Diane Skinner is a 89 y.o. female.   HPI   89 year old female presents emergency department as a code stroke.  Patient was reportedly doing outpatient PT when she had sudden onset right-sided facial droop, right sided weakness and slurred speech.  This was witnessed by EMS on arrival, symptoms improving on arrival.  Patient evaluated in EMS bridge with the stroke neurology team at bedside.  Patient taken emergently to head CT.  Not on any blood thinning medication, CBG is normal.  Prior to Admission medications   Medication Sig Start Date End Date Taking? Authorizing Provider  acetaminophen  (TYLENOL ) 325 MG tablet Take 2 tablets (650 mg total) by mouth every 6 (six) hours as needed for mild pain (pain score 1-3) (or Fever >/= 101). 04/04/24   Arlice Reichert, MD  albuterol  (PROVENTIL ) (2.5 MG/3ML) 0.083% nebulizer solution Take 3 mLs (2.5 mg total) by nebulization every 6 (six) hours as needed for wheezing or shortness of breath. 04/04/24   Dahal, Reichert, MD  amLODipine  (NORVASC ) 10 MG tablet TAKE 1 TABLET BY MOUTH EVERY DAY Patient taking differently: Take 10 mg by mouth daily. 02/25/22   Zinoviev, Eva, MD  Cyanocobalamin  (VITAMIN B12 PO) Take 1 capsule by mouth at bedtime.    [provider]  feeding supplement (ENSURE PLUS HIGH PROTEIN) LIQD Take 237 mLs by mouth 2 (two) times daily between meals. 04/04/24   Arlice Reichert, MD  gabapentin  (NEURONTIN ) 300 MG capsule Take 1 capsule (300 mg total) by mouth at bedtime. Patient taking differently: Take 600 mg by mouth at bedtime. 12/10/22   Adhikari, Amrit, MD  torsemide (DEMADEX) 5 MG tablet Take 5 mg by mouth 3 (three) times a week. 03/17/24   [provider]   traZODone  (DESYREL ) 50 MG tablet Take 1 tablet (50 mg total) by mouth at bedtime. Patient not taking: Reported on 03/30/2024 12/10/22   Adhikari, Amrit, MD  valsartan-hydrochlorothiazide (DIOVAN-HCT) 160-25 MG tablet Take 1 tablet by mouth daily. 10/26/23   [provider]    Allergies: Celexa  [citalopram ], Ibuprofen, and Valsartan-hydrochlorothiazide    Review of Systems  Constitutional:  Negative for fever.  Respiratory:  Negative for shortness of breath.   Cardiovascular:  Negative for chest pain.  Gastrointestinal:  Negative for abdominal pain, diarrhea and vomiting.  Skin:  Negative for rash.  Neurological:  Positive for facial asymmetry, speech difficulty and weakness. Negative for headaches.    Updated Vital Signs BP (!) 158/84   Pulse (!) 59   Temp (!) 97.4 F (36.3 C) (Oral)   Resp 16   Wt 60.8 kg   SpO2 100%   BMI 22.31 kg/m   Physical Exam Vitals and nursing note reviewed.  Constitutional:      Appearance: Normal appearance.  HENT:     Head: Normocephalic.     Mouth/Throat:     Mouth: Mucous membranes are moist.  Cardiovascular:     Rate and Rhythm: Normal rate.  Pulmonary:     Effort: Pulmonary effort is normal. No respiratory distress.  Abdominal:     Palpations: Abdomen is soft.     Tenderness: There is no abdominal tenderness.  Skin:    General: Skin is warm.  Neurological:     General: No focal  deficit present.     Mental Status: She is alert and oriented to person, place, and time.  Psychiatric:        Mood and Affect: Mood normal.     (all labs ordered are listed, but only abnormal results are displayed) Labs Reviewed  CBC - Abnormal; Notable for the following components:      Result Value   MCH 25.6 (*)    Platelets 140 (*)    All other components within normal limits  I-STAT CHEM 8, ED - Abnormal; Notable for the following components:   Potassium 5.8 (*)    BUN 52 (*)    Creatinine, Ser 2.40 (*)    Calcium , Ion 1.12 (*)    All  other components within normal limits  CBG MONITORING, ED - Abnormal; Notable for the following components:   Glucose-Capillary 103 (*)    All other components within normal limits  PROTIME-INR  APTT  DIFFERENTIAL  COMPREHENSIVE METABOLIC PANEL WITH GFR  ETHANOL    EKG: None  Radiology: CT HEAD CODE STROKE WO CONTRAST Result Date: 07/13/2024 EXAM: CT HEAD WITHOUT CONTRAST 07/13/2024 10:22:24 AM TECHNIQUE: CT of the head was performed without the administration of intravenous contrast. Automated exposure control, iterative reconstruction, and/or weight based adjustment of the mA/kV was utilized to reduce the radiation dose to as low as reasonably achievable. COMPARISON: Head CT 12/05/2022. CLINICAL HISTORY: 89 year old female with acute neuro deficit, stroke suspected. Code stroke. FINDINGS: BRAIN AND VENTRICLES: No acute hemorrhage. No evidence of acute infarct. No hydrocephalus. No extra-axial collection. No mass effect or midline shift. Cerebral volume remains normal for age. Patchy bilateral white matter hypodensity is stable. No suspicious intracranial vascular hyperdensity. No gaze deviation. [place] Stroke Program Early CT Score (ASPECTS) Ganglionic (caudate, IC, lentiform nucleus, insula, M1-M3): 7 Supraganglionic (M4-M6): 3 Total: 10 ORBITS: No acute abnormality. SINUSES: No acute abnormality. SOFT TISSUES AND SKULL: No acute soft tissue abnormality. No skull fracture. IMPRESSION: 1. No acute intracranial abnormality.  Stable white matter disease.  ASPECTS 10. 2. These results were communicated to Dr Vanessa at 10:30 on 07/13/2024 by text page via the [facility] messaging system. Electronically signed by: Helayne Hurst MD 07/13/2024 10:32 AM EDT RP Workstation: [id number]     .Critical Care  Performed by: Bari Roxie HERO, DO Authorized by: Bari Roxie HERO, DO   Critical care provider statement:    Critical care time (minutes):  30   Critical care time was exclusive of:  Separately  billable procedures and treating other patients   Critical care was necessary to treat or prevent imminent or life-threatening deterioration of the following conditions:  CNS failure or compromise   Critical care was time spent personally by me on the following activities:  Development of treatment plan with patient or surrogate, discussions with consultants, evaluation of patient's response to treatment, examination of patient, ordering and review of laboratory studies, ordering and review of radiographic studies, ordering and performing treatments and interventions, pulse oximetry, re-evaluation of patient's condition and review of old charts   I assumed direction of critical care for this patient from another provider in my specialty: no      Medications Ordered in the ED  sodium chloride  flush (NS) 0.9 % injection 3 mL (3 mLs Intravenous Not Given 07/13/24 1038)                                    Medical Decision  Making Amount and/or Complexity of Data Reviewed Labs: ordered. Radiology: ordered.   89 year old female presented as a code stroke, symptoms resolved on arrival.  Seen in conjunction with the stroke neurology team.  Concern for acute onset of facial droop, aphasia and weakness.  Resolved on arrival.  CT of the head is unremarkable.  Blood work shows baseline CKD.  Recommendations from neurology is MRI of the brain and EEG.  EEG came back normal.  If MRI of the brain is negative then she can be discharged for outpatient neurology follow-up, per recommendations from Dr. Vanessa.  Patient signed out pending MRI of the brain.  Continues to be neurologically at baseline.  Vital stable.     Final diagnoses:  None    ED Discharge Orders     None          Bari Roxie HERO, DO 07/13/24 1557

## 2024-07-13 NOTE — ED Provider Notes (Signed)
 Care transferred to me.  Patient's MRI is negative.  EEG is also negative.  Of note, there is some slight hyperkalemia but it seems like there was some hemolysis with a level of 5.6.  She is on valsartan-HCTZ.  Will have her hold this for couple days and will give a dose of Lokelma  in case this is an actual value.  No ECG changes for hyperkalemia noted.  Will have her recheck her potassium with her PCP.  Call them in the morning.  Appears stable for discharge.   Freddi Hamilton, MD 07/13/24 [phone]

## 2024-07-13 NOTE — Code Documentation (Signed)
 Stroke Response Nurse Documentation Code Documentation  Diane Skinner is a 89 y.o. female arriving to [facility]  via [facility] on 07/13/2024 with past medical hx of HTN arthritis hearing loss, CKD. On No antithrombotic. Code stroke was activated by EMS.   Patient from PT at [facility] facility where she was LKW at 0915 and now complaining of rt sided weakness and rt gaze .   Stroke team at the bedside on patient arrival. Labs drawn and patient cleared for CT by Dr. Bari. Patient to CT with team. NIHSS 0, see documentation for details and code stroke times. Patient with no deficits on exam. The following imaging was completed:  CT Head. Patient is not a candidate for IV Thrombolytic due to currently no deficits. Patient is not a candidate for IR due to currently no deficits.   Care Plan:   No acute treatment/TIA alert: q2h x 12 hours NIHSS & VS, then q4h   Bedside handoff with ED RN Waddell.    Sharia Averitt Livengood  Stroke Response RN

## 2024-07-13 NOTE — ED Triage Notes (Signed)
 Pt bib ems as a CODE STROKE. LNW 915 this morning while doing outpatient PT. Pt noticed right sided facial droop, right sided weakness and slurred speech. By the time Ems arrived her symptoms were improving but they did notice a right sided gaze, and some right sided weakness, pt was having a hard time following commands. Upon arrival to ED pt alert and oriented. No weakness noted on exam.

## 2024-07-13 NOTE — Consult Note (Signed)
 NEUROLOGY CONSULT NOTE   Date of service: July 13, 2024 Patient Name: Diane Skinner MRN:  [id number] DOB:  Oct 12, 1922 Chief Complaint: Slurred speech, R sided weakness, difficulty following commands with R gaze that started while working with PT Requesting Provider: No att. providers found  History of Present Illness  Diane Skinner is a 89 y.o. female with hx of HTN, arthritis, hard of hearing, peripheral neuropathy who was at her facility working with PT when she was noted to have acute onset R sided weakness, slurred speech, R facial droop and R gaze deviation.  EMS called and symptoms had significantly improved. She only had some slight slurred speech, weak R hand grip by the time EMS got to her. No symptoms on arrival to the ED. She is hard of hearing but able to follow all the commands with her hearing aids in.  She tells me that she was with a bunch of people and they thought that she needed help so they called the ambulance for her. She is feeling cold.  LKW: 915AM Modified rankin score: 3-Moderate disability-requires help but walks WITHOUT assistance IV Thrombolysis: not offered, no symptoms. EVT: not offered, no symptoms.   NIHSS components Score: Comment  1a Level of Conscious 0[x]  1[]  2[]  3[]      1b LOC Questions 0[x]  1[]  2[]       1c LOC Commands 0[x]  1[]  2[]       2 Best Gaze 0[x]  1[]  2[]       3 Visual 0[x]  1[]  2[]  3[]      4 Facial Palsy 0[x]  1[]  2[]  3[]      5a Motor Arm - left 0[x]  1[]  2[]  3[]  4[]  UN[]    5b Motor Arm - Right 0[x]  1[]  2[]  3[]  4[]  UN[]    6a Motor Leg - Left 0[x]  1[]  2[]  3[]  4[]  UN[]    6b Motor Leg - Right 0[x]  1[]  2[]  3[]  4[]  UN[]    7 Limb Ataxia 0[x]  1[]  2[]  UN[]      8 Sensory 0[x]  1[]  2[]  UN[]      9 Best Language 0[x]  1[]  2[]  3[]      10 Dysarthria 0[x]  1[]  2[]  UN[]      11 Extinct. and Inattention 0[x]  1[]  2[]       TOTAL: 0      ROS  Comprehensive ROS performed and pertinent positives documented in HPI   Past History   Past Medical  History:  Diagnosis Date   Arthritis    Hypertension    Renal disorder     Past Surgical History:  Procedure Laterality Date   BREAST SURGERY     CHOLECYSTECTOMY      Family History: Family History  Problem Relation Age of Onset   Hypertension Mother    Heart disease Mother    Breast cancer Sister     Social History  reports that she has never smoked. She has never used smokeless tobacco. She reports that she does not currently use alcohol. She reports that she does not currently use drugs.  Allergies  Allergen Reactions   Celexa  [Citalopram ] Other (See Comments)    Tremors of hands/legs   Ibuprofen Swelling   Valsartan-Hydrochlorothiazide Other (See Comments)    Hyperkalemia ---- Patient is still taking this medication at home as of 03/30/2024. Needs to be reviewed.     Medications   Current Facility-Administered Medications:    sodium chloride  flush (NS) 0.9 % injection 3 mL, 3 mL, Intravenous, Once, Horton, Kristie M, DO  Current Outpatient Medications:    acetaminophen  (TYLENOL )  325 MG tablet, Take 2 tablets (650 mg total) by mouth every 6 (six) hours as needed for mild pain (pain score 1-3) (or Fever >/= 101)., Disp: , Rfl:    albuterol  (PROVENTIL ) (2.5 MG/3ML) 0.083% nebulizer solution, Take 3 mLs (2.5 mg total) by nebulization every 6 (six) hours as needed for wheezing or shortness of breath., Disp: 75 mL, Rfl: 12   amLODipine  (NORVASC ) 10 MG tablet, TAKE 1 TABLET BY MOUTH EVERY DAY (Patient taking differently: Take 10 mg by mouth daily.), Disp: 90 tablet, Rfl: 3   Cyanocobalamin  (VITAMIN B12 PO), Take 1 capsule by mouth at bedtime., Disp: , Rfl:    feeding supplement (ENSURE PLUS HIGH PROTEIN) LIQD, Take 237 mLs by mouth 2 (two) times daily between meals., Disp: , Rfl:    gabapentin  (NEURONTIN ) 300 MG capsule, Take 1 capsule (300 mg total) by mouth at bedtime. (Patient taking differently: Take 600 mg by mouth at bedtime.), Disp: 30 capsule, Rfl: 0   torsemide  (DEMADEX) 5 MG tablet, Take 5 mg by mouth 3 (three) times a week., Disp: , Rfl:    traZODone  (DESYREL ) 50 MG tablet, Take 1 tablet (50 mg total) by mouth at bedtime. (Patient not taking: Reported on 03/30/2024), Disp: 30 tablet, Rfl: 0   valsartan-hydrochlorothiazide (DIOVAN-HCT) 160-25 MG tablet, Take 1 tablet by mouth daily., Disp: , Rfl:   Vitals   Vitals:   07/13/24 1000  Weight: 60.8 kg    Body mass index is 22.31 kg/m.   Physical Exam   General: Laying comfortably in bed; in no acute distress.  HENT: Normal oropharynx and mucosa. Normal external appearance of ears and nose.  Neck: Supple, no pain or tenderness  CV: No JVD. No peripheral edema.  Pulmonary: Symmetric Chest rise. Normal respiratory effort.  Abdomen: Soft to touch, non-tender.  Ext: No cyanosis, edema, or deformity  Skin: No rash. Normal palpation of skin.   Musculoskeletal: Normal digits and nails by inspection. No clubbing.   Neurologic Examination  Mental status/Cognition: Alert, oriented to self, place, month but not to year, good attention.  Speech/language: Fluent, comprehension intact, object naming intact, repetition intact.  Cranial nerves:   CN II Pupils equal and reactive to light, no VF deficits    CN III,IV,VI EOM intact, no gaze preference or deviation, no nystagmus    CN V normal sensation in V1, V2, and V3 segments bilaterally    CN VII no asymmetry, no nasolabial fold flattening    CN VIII normal hearing to speech    CN IX & X normal palatal elevation, no uvular deviation    CN XI 5/5 head turn and 5/5 shoulder shrug bilaterally    CN XII midline tongue protrusion    Motor:  Muscle bulk: normal, tone normal, pronator drift none tremor none Mvmt Root Nerve  Muscle Right Left Comments  SA C5/6 Ax Deltoid 5 5   EF C5/6 Mc Biceps 5 5   EE C6/7/8 Rad Triceps 5 5   WF C6/7 Med FCR     WE C7/8 PIN ECU     F Ab C8/T1 U ADM/FDI 5 5   HF L1/2/3 Fem Illopsoas 4+ 4+   KE L2/3/4 Fem Quad 5 5   DF  L4/5 D Peron Tib Ant 5 5   PF S1/2 Tibial Grc/Sol 5 5    Sensation:  Light touch Intact throughout   Pin prick    Temperature    Vibration   Proprioception    Coordination/Complex Motor:  -  Finger to Nose intact BL - Heel to shin intact BL - Rapid alternating movement are slowed throughout - Gait: deferred.  Labs/Imaging/Neurodiagnostic studies   CBC:  Recent Labs  Lab 2024-07-17 1019  HGB 12.9  HCT 38.0   Basic Metabolic Panel:  Lab Results  Component Value Date   NA 138 17-Jul-2024   K 5.8 (H) 07/17/2024   CO2 28 04/03/2024   GLUCOSE 98 July 17, 2024   BUN 52 (H) 07-17-2024   CREATININE 2.40 (H) 07/17/24   CALCIUM  8.6 (L) 04/03/2024   GFRNONAA 20 (L) 04/03/2024   Lipid Panel: No results found for: LDLCALC HgbA1c: No results found for: HGBA1C Urine Drug Screen: No results found for: LABOPIA, COCAINSCRNUR, LABBENZ, AMPHETMU, THCU, LABBARB  Alcohol Level No results found for: ETH INR No results found for: INR APTT No results found for: APTT AED levels: No results found for: PHENYTOIN, ZONISAMIDE, LAMOTRIGINE, LEVETIRACETA  CT Head without contrast(Personally reviewed): CTH was negative for a large hypodensity concerning for a large territory infarct or hyperdensity concerning for an ICH   MRI Brain(Personally reviewed): pending  Neurodiagnostics rEEG:  pending  ASSESSMENT   Diane Skinner is a 89 y.o. female p/w episode of R sided weakness and R gaze with gradual return to baseline. The fact that the gaze deviation was to the side of her weakness, seems concerning for a seizure. She does not have a prior hx of seizure thou. She remembers being surrounded by people but does not recall the actual event. She is oriented to self, place, month but not to year and has no focal deficits at this time. Would expect a stroke/TIA to cause gaze deviation to the left with right sided weakness.  RECOMMENDATIONS  - MRI Brain and routine EEG. -  CBC, chemistry. - not on any meds that can lower seizure threshold. - does not drive, lives in a SNF. ______________________________________________________________________  Plan discussed with patient and with Dr. Bari with the ED team.  Signed, Nusaybah Ivie, MD [facility]

## 2024-07-13 NOTE — ED Notes (Signed)
 Pt caregiver from facility at bedside for patient discharge.

## 2024-10-20 ENCOUNTER — Encounter (HOSPITAL_COMMUNITY): Payer: Self-pay

## 2024-10-20 ENCOUNTER — Emergency Department (HOSPITAL_COMMUNITY)

## 2024-10-20 ENCOUNTER — Emergency Department (HOSPITAL_COMMUNITY)
Admission: EM | Admit: 2024-10-20 | Discharge: 2024-10-21 | Disposition: A | Discharge status: Skilled Nursing Facility | Attending: Emergency Medicine | Admitting: Emergency Medicine

## 2024-10-20 DIAGNOSIS — R410 Disorientation, unspecified: Secondary | ICD-10-CM | POA: Diagnosis not present

## 2024-10-20 DIAGNOSIS — R471 Dysarthria and anarthria: Secondary | ICD-10-CM | POA: Insufficient documentation

## 2024-10-20 DIAGNOSIS — Z79899 Other long term (current) drug therapy: Secondary | ICD-10-CM | POA: Diagnosis not present

## 2024-10-20 DIAGNOSIS — R29810 Facial weakness: Secondary | ICD-10-CM | POA: Insufficient documentation

## 2024-10-20 DIAGNOSIS — I10 Essential (primary) hypertension: Secondary | ICD-10-CM | POA: Insufficient documentation

## 2024-10-20 DIAGNOSIS — R4781 Slurred speech: Secondary | ICD-10-CM | POA: Insufficient documentation

## 2024-10-20 LAB — CBC WITH DIFFERENTIAL/PLATELET
Abs Immature Granulocytes: 0.03 10*3/uL (ref 0.00–0.07)
Basophils Absolute: 0 10*3/uL (ref 0.0–0.1)
Basophils Relative: 1 %
Eosinophils Absolute: 0 10*3/uL (ref 0.0–0.5)
Eosinophils Relative: 0 %
HCT: 34.3 % — ABNORMAL LOW (ref 36.0–46.0)
Hemoglobin: 11.1 g/dL — ABNORMAL LOW (ref 12.0–15.0)
Immature Granulocytes: 1 %
Lymphocytes Relative: 32 %
Lymphs Abs: 2 10*3/uL (ref 0.7–4.0)
MCH: 26.9 pg (ref 26.0–34.0)
MCHC: 32.4 g/dL (ref 30.0–36.0)
MCV: 83.1 fL (ref 80.0–100.0)
Monocytes Absolute: 0.6 10*3/uL (ref 0.1–1.0)
Monocytes Relative: 9 %
Neutro Abs: 3.7 10*3/uL (ref 1.7–7.7)
Neutrophils Relative %: 57 %
Platelets: UNDETERMINED 10*3/uL (ref 150–400)
RBC: 4.13 MIL/uL (ref 3.87–5.11)
RDW: 14.6 % (ref 11.5–15.5)
WBC: 6.4 10*3/uL (ref 4.0–10.5)
nRBC: 0 % (ref 0.0–0.2)

## 2024-10-20 LAB — COMPREHENSIVE METABOLIC PANEL WITH GFR
ALT: <5 U/L (ref 0–44)
AST: 19 U/L (ref 15–41)
Albumin: 3 g/dL — ABNORMAL LOW (ref 3.5–5.0)
Alkaline Phosphatase: 55 U/L (ref 38–126)
Anion gap: 11 (ref 5–15)
BUN: 29 mg/dL — ABNORMAL HIGH (ref 8–23)
CO2: 15 mmol/L — ABNORMAL LOW (ref 22–32)
Calcium: 6.8 mg/dL — ABNORMAL LOW (ref 8.9–10.3)
Chloride: 116 mmol/L — ABNORMAL HIGH (ref 98–111)
Creatinine, Ser: 1.76 mg/dL — ABNORMAL HIGH (ref 0.44–1.00)
GFR, Estimated: 26 mL/min — ABNORMAL LOW
Glucose, Bld: 95 mg/dL (ref 70–99)
Potassium: 3.8 mmol/L (ref 3.5–5.1)
Sodium: 142 mmol/L (ref 135–145)
Total Bilirubin: 0.9 mg/dL (ref 0.0–1.2)
Total Protein: 5.2 g/dL — ABNORMAL LOW (ref 6.5–8.1)

## 2024-10-20 LAB — URINALYSIS, ROUTINE W REFLEX MICROSCOPIC
Bilirubin Urine: NEGATIVE
Glucose, UA: NEGATIVE mg/dL
Hgb urine dipstick: NEGATIVE
Ketones, ur: NEGATIVE mg/dL
Leukocytes,Ua: NEGATIVE
Nitrite: NEGATIVE
Protein, ur: NEGATIVE mg/dL
Specific Gravity, Urine: 1.013 (ref 1.005–1.030)
pH: 5 (ref 5.0–8.0)

## 2024-10-20 LAB — MAGNESIUM: Magnesium: 1.7 mg/dL (ref 1.7–2.4)

## 2024-10-20 NOTE — ED Notes (Signed)
 [facility] called to notify staff of pt discharge, This RN called and spoke with pt niece Darryle to notify her of pt discharge.

## 2024-10-20 NOTE — Discharge Instructions (Signed)
 Thank you for letting us  take care of you today.  You came today for evaluation of speech changes as well as maybe his facial weakness.  We did blood work that was reassuring.  We also did an MRI as well as CT scan of the brain that thankfully did not show any acute abnormalities.  We recommend that you follow-up with your doctor for further assessment.  Please come back to the emergency department if you have worsening symptoms.

## 2024-10-20 NOTE — ED Notes (Addendum)
 Pt resting in bed.

## 2024-10-20 NOTE — ED Triage Notes (Signed)
 BIB EMS from [facility] r/t Slurred speech and left facial droop. LKW 1730 yesterday per family via phone call. A&Ox4.  EMS reports left facial droop improved while en route.

## 2024-10-21 NOTE — ED Notes (Signed)
 [facility] arrived and given handoff at bedside
